# Patient Record
Sex: Male | Born: 1935
Health system: Southern US, Community
[De-identification: ages and names within clinical notes are randomized; demographics above are authoritative.]

## PROBLEM LIST (undated history)

## (undated) DIAGNOSIS — N184 Chronic kidney disease, stage 4 (severe): Secondary | ICD-10-CM

## (undated) DIAGNOSIS — H409 Unspecified glaucoma: Secondary | ICD-10-CM

## (undated) DIAGNOSIS — I10 Essential (primary) hypertension: Secondary | ICD-10-CM

## (undated) DIAGNOSIS — N189 Chronic kidney disease, unspecified: Secondary | ICD-10-CM

## (undated) DIAGNOSIS — H35039 Hypertensive retinopathy, unspecified eye: Secondary | ICD-10-CM

## (undated) DIAGNOSIS — Z862 Personal history of diseases of the blood and blood-forming organs and certain disorders involving the immune mechanism: Secondary | ICD-10-CM

## (undated) HISTORY — DX: Chronic kidney disease, stage 4 (severe): N18.4

## (undated) HISTORY — DX: Hypertensive retinopathy, unspecified eye: H35.039

## (undated) HISTORY — DX: Chronic kidney disease, unspecified: N18.9

## (undated) HISTORY — DX: Personal history of diseases of the blood and blood-forming organs and certain disorders involving the immune mechanism: Z86.2

## (undated) HISTORY — PX: CATARACT EXTRACTION: SUR2

## (undated) HISTORY — DX: Unspecified glaucoma: H40.9

## (undated) HISTORY — PX: EYE SURGERY: SHX253

## (undated) HISTORY — PX: OTHER SURGICAL HISTORY: SHX169

---

## 1999-04-23 ENCOUNTER — Ambulatory Visit (HOSPITAL_COMMUNITY): Admission: RE | Admit: 1999-04-23 | Discharge: 1999-04-23 | Payer: Self-pay | Admitting: *Deleted

## 2002-03-30 ENCOUNTER — Ambulatory Visit (HOSPITAL_COMMUNITY): Admission: RE | Admit: 2002-03-30 | Discharge: 2002-03-30 | Payer: Self-pay | Admitting: Internal Medicine

## 2006-06-08 ENCOUNTER — Encounter: Admission: RE | Admit: 2006-06-08 | Discharge: 2006-06-08 | Payer: Self-pay | Admitting: Orthopedic Surgery

## 2006-07-27 ENCOUNTER — Inpatient Hospital Stay (HOSPITAL_COMMUNITY): Admission: RE | Admit: 2006-07-27 | Discharge: 2006-07-31 | Payer: Self-pay | Admitting: Orthopedic Surgery

## 2007-10-18 ENCOUNTER — Encounter: Admission: RE | Admit: 2007-10-18 | Discharge: 2007-10-18 | Payer: Self-pay | Admitting: Internal Medicine

## 2008-01-18 ENCOUNTER — Encounter: Admission: RE | Admit: 2008-01-18 | Discharge: 2008-01-18 | Payer: Self-pay | Admitting: Internal Medicine

## 2008-07-19 ENCOUNTER — Encounter: Admission: RE | Admit: 2008-07-19 | Discharge: 2008-07-19 | Payer: Self-pay | Admitting: Internal Medicine

## 2008-08-20 ENCOUNTER — Encounter: Admission: RE | Admit: 2008-08-20 | Discharge: 2008-08-20 | Payer: Self-pay | Admitting: Internal Medicine

## 2010-06-28 ENCOUNTER — Encounter: Payer: Self-pay | Admitting: Orthopedic Surgery

## 2010-10-23 NOTE — Discharge Summary (Signed)
Danny Rhodes, Danny Rhodes               ACCOUNT NO.:  [id number]   MEDICAL RECORD NO.:  [id number]          PATIENT TYPE:  INP   LOCATION:  [id number]                         FACILITY:  [facility]   PHYSICIAN:  Pietro Cassis. Alvan Dame, M.D.  DATE OF BIRTH:  1927-05-01   DATE OF ADMISSION:  07/27/2006  DATE OF DISCHARGE:  07/31/2006                               DISCHARGE SUMMARY   ADMITTING DIAGNOSES:  1. Osteoarthritis.  2. Hypertension.  3. Hiatal hernia.  4. Petrusio acetabulum.  5. 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   DISCHARGE DIAGNOSES:  1. Osteoarthritis.  2. Hypertension.  3. Hiatal hernia.  4. Petrusio acetabulum.  5. 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   CONSULTS:  None.   PROCEDURE:  Revision of right total hip replacement utilizing  accommodation of auto and allograft products.  Surgeon was Paralee Cancel,  M.D.  Assistant Collene Mares, PA-C.   PREADMISSION LABORATORIES:  1. CBC showed his hemoglobin of 13.2, hematocrit 39.6.  Postoperative      day #1, respectively, hemoglobin 9.6, hematocrit 28.4.      Postoperative day #2 of 8.4 and 24.8.  On postoperative day #3 of      8.2 and 24.2.  2. Preadmission differential within normal limits.  3. Preadmission coagulation INR 1.1.  Postoperative day #1, INR of      1.3.  4. Preadmission chemistries - sodium 139, potassium 4.1, glucose 85.      Postoperative day #1 - sodium 139, potassium 4.8, glucose 121.      Postoperative day #2 - sodium 138, potassium 4.5, glucose 126.  5. Preadmission urinalysis negative.   ELECTROCARDIOGRAM:  Sinus bradycardia with first-degree AV block,  nonspecific T-wave abnormality, abnormal ECG.   CHEST X-RAY:  Left upper lobe opacities concerning for malignancy.  A CT  of the chest recommended.   POSTOPERATIVE RIGHT HIP:  Satisfactory position of the right total hip  prosthesis revision.   BRIEF HISTORY OF PRESENT ILLNESS:  This is a 77 year old  male with  multiple right hip surgeries with a history of acetabulum petrusio and  osteolysis.  He has been a patient of our practice and Dr. Gladstone Lighter for  years.  He was kindly referred to our office for evaluation.  He was  really seen after he fell, slipped off a curb and landed onto his right  hip.  Since that point in time, the pain has developed and gotten  persistently progressively worse.  He has developed acetabular petrusio.  As a result of this, he was cleared premedically by his primary care  physician for a revision right total hip replacement.   HOSPITAL COURSE:  The patient underwent revision of right total hip  replacement and tolerated the procedure well.  Pain was well-controlled  throughout his course of stay.  On postoperative day #1, he was doing  alright.  He had some pain in his great toe.  He had a history of gout  and was treated accordingly with some colchicine which relieved the  pain.  He was afebrile.  His INR was 1.3.  His hip was  dry.  He was  neuromuscularly and vascularly intact.  He was touchdown weightbearing  of the right lower extremity with the use of a rolling walker and DVT  prophylaxis and was begun with Coumadin.  PT evaluation was done which  recommended discharge to home with home health care physical therapy and  with family assistance, and [place] was selected.  On  postoperative day #2, he was doing well.  Dressing was changed.  The  wound was without active drainage.  He continued to be neuromuscularly  and vascularly intact.  He continued to be touchdown weightbearing of  his right lower extremity.  Occupational therapy recommended a bathtub,  a transfer bench, as well as a 3-in-1 commode and recommended home  health care physical therapy.  On postoperative day #3, he was doing  extremely well.  No acute events.  His hematocrit was 24.8 and stable.  His right hip was dry.  Neuromuscularly and vascularly intact and  continued be  touchdown weightbearing in the right lower extremity.  On  postoperative day #3, [facility] delivered the rolling walker,  transfer bench and 3-in-1 commode for home use.  On postoperative day  #4, the patient was doing well and was ready to be discharged home.  His  prescription was on the chart.  His hip was dry.  The dressing was  changed and showed no active drainage.  He was neuromuscular vascularly  intact.  Discharge physical therapy showed that by postoperative day #4,  he was able to use a rolling walker with minimal assistance, walking up  to 60 feet.  With occupational therapy was able to go from supine to  sitting with moderate assistance, sitting to standing with moderate  assistance and standing to sitting.   DISCHARGE DISPOSITION:  Discharged home with home health care physical  therapy, as well as occupational therapy.   DISCHARGE PHYSICAL THERAPY:  Goals of physical therapy will be to  minimize pain, maximize upper body strength, help in ambulation with  only touchdown weightbearing status, the use of a rolling walker, as  well as coordination with occupational therapy to help them with  activities of daily living.   DISCHARGE WOUND CARE:  Keep wound dry.  Change dressing daily.  If the  patient does want to shower, please cover with an impervious substance  utilizing tape.  Afterwards, redress with a dry dressing.   DISCHARGE FOLLOWUP:  Follow up with Dr. Alvan Dame, [phone], in 2 weeks for  wound care check.   DISCHARGE DIET:  Regular as tolerated by the patient.   DISCHARGE MEDICATIONS:  1. Diovan HCTZ 160/25 mg one p.o. q.a.m.  2. Potassium chloride 20 mEq 2 tabs p.o. b.i.d.  3. Caduet 10/20 one p.o. q.a.m.  4. Metoprolol 1-1/2 tablets 50 mg one p.o. b.i.d.  5. Vitamin C 500 mg one p.o. daily.  6. Coumadin, maintain INR between 2 and 3.  7. Propoxyphene N-100 one p.o. p.r.n. 4-6 p.r.n. pain. 8. Robaxin 500 mg one to two p.o. q.6-8h. p.r.n. muscle spasm  pain.  9. Colace 100 mg one p.o. b.i.d. p.r.n. constipation.   DISCHARGE SPECIAL INSTRUCTIONS:  If the patient develops any severe calf  pain or acute shortness of breath, please call emergency services  immediately.     ______________________________  Carlean Jews Collene Mares, [place]      Pietro Cassis. Alvan Dame, M.D.  Electronically Signed    BLM/MEDQ  D:  08/18/2006  T:  08/19/2006  Job:  [id number]

## 2010-10-23 NOTE — Op Note (Signed)
NAMEVICENT, Danny Rhodes               ACCOUNT NO.:  [id number]   MEDICAL RECORD NO.:  [id number]          PATIENT TYPE:  INP   LOCATION:  X007                         FACILITY:  [facility]   PHYSICIAN:  Danny Rhodes, M.D.  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   PROCEDURE:  Revision, right total hip replacement, utilizing a  combination of auto and allograft products.   COMPONENTS USED:  A Biomet revision acetabular cup, size 66, Regenerex  cup, two cancellous bone screws, a 32 with 10 degree high wall placed  superiorly with 10 degree high wall liner.  Then used an Osteonics 32 +  10 ball onto a dried trunnion of a stable femoral component.  I also  utilized 10 cc of cancellous VITOSS chips with 10 cc of the VITOSS putty  to pack off ischial defect as well as acetabular defect.   FINDINGS:  The patient was noted to have a severe acetabular defect but  was found to have an intact posterior column leading to an intact ileum  and an inferior column piece at the pubic rami but no evidence of any  anterior wall.  The ischium was severely effected with osteolysis, and  the entire medial wall was opened to the pelvis.   SURGEON:  Danny Rhodes, M.D.   ASSISTANT:  Danny Jews. Collene Mares, PA-C.   ANESTHESIA:  General.   BLOOD LOSS:  600 cc.  I utilized Cell Saver.  The patient received 200  cc of Cell Saver from the blood bank.   COMPLICATIONS:  None apparent.   INDICATIONS FOR PROCEDURE:  Mr. Danny Rhodes is a very pleasant 77 year old  gentleman who was referred kindly by Dr. Lindwood Rhodes for surgical  consideration of his right hip.  He had a history of a primary hip  replacement in the 1970s with subsequent revision and  periprosthetic  complications in the 0000000.  He had failure of his acetabulum with  superior migration and acetabular protrusio with persistent pain with  any common activity.   He presented for surgical consideration.  Following workup and ruling  out sources of infection as a source of failure, we discussed revision  surgery.  Risks and benefits were discussed, including infection,  dislocation, the need for further revision surgery, failure of this type  of procedure.  Consent obtained.   PROCEDURE IN DETAIL:  Patient was brought to the operating theater.  Once adequate anesthesia, then 1 gm of Ancef were administered, the  patient was positioned in the left lateral decubitus position with the  right side up.  The right hip, proximal thigh area, was prescrubbed with  Betadine, then prepped and draped in a sterile fashion from the knee to  the iliac crest.  Patient had multiple surgical incisions.  Two of these  incisions that were in relatively close proximity were excised,  debriding scar tissue.  The iliotibial band and gluteus fascia were  identified and found to be held intact with old Ethibond sutures.  These  were incised in line with the incision for a posterior approach.  At  this point, it was identified, a significant amount of scar tissue in  the posterior aspect of the hip.  Some time was spent at this point  exposing the hip joint, removing a bunch of the scar.  I did keep a  posterior leaflet of tissue available for primary closure as well as  protection against the sciatic nerve.   Following debridement, the hip joint was identified.  Joint fluid  appeared to be normal with no evidence of obvious purulence.  Following  exposure of the hip joint, the acetabular shell was noted to be rotated  significantly and obviously loose.  The hip was dislocated using a bone  hook.  The femoral stem was noted to be stable without evidence of  loosening.  I removed the femoral head  without complication.  This  allowed for further mobilization of the femur to further debride  anteriorly and allow for mobilization of the patient's hip.  Following  this and use of curettes, I was able to debride the rim of bone and soft  tissues around the cuff, then remove the cuff without complication.  At  this point, it exposed the very large medial wall defect.  Care was now  taken to debride the soft tissues from the remaining bone stock and  identify what was available.  Palpable examination indicated that the  pelvis was intact.  There was no evidence of any pelvic discontinuity;  however, there was very little bone left.  I did find that there was a  small portion of the anterior inferior acetabulum right near the pubic  bone intact.  The ischium was severely osteolytic and was debrided  extensively of osteolytic debris.  Posteriorly, there was a bit of his  posterior column intact up to the ileum anterior and proximally.  From  about one o'clock on anteriorly, there was no bone down to the pubis at  about 5 o'clock.   Following this debridement, I began evaluating through acetabular  placement.  Careful reaming was carried up sequentially to try to  prepare the bone as much as possible.  I did ream up to a 64 reamer,  getting some bony feedback but did not want to ream too much bone.  With  this, I chose to use a 66 cup.  This was opened.  A Biometer and  Regenerex 66 cup was then opened.   At this point, I did pack the ischium with approximately 10 cc of  cancellous chips as well as this putty bone graft on VITOSS.  I then  used the remaining portion of the putty, then the remaining chips to  create a mold for the anterior aspect of the ileum.  Once this was  placed, I impacted the 66 cup.  There was initial scratch fit present  but not super stable.  Based on the remaining bone stock, only two screws were possible to place, and this was done in an ileum.  These had  an  excellent purchase into the iliac bone.  With this and following  this, I checked the acetabular position.  The knee was in about 20-25  degrees of forward flexion and 40-45 degrees of abduction.  At this  point, evaluating the femoral stem, identifying the old  Morris taper  femoral stem.  Available in our hospital was a 32 head and this taper.  I then trialed with an available 32 liner for this system, which  included a 10 degree high wall build-up.  Based on the position of the  acetabulum, we chose using the high wall proximally but flattening in my  relative cup position to about 35-40 degrees.  The trial reduction was  first carried out with a +5 ball.  I then felt that the +10 ball was  more stable, eliminating shuck with the leg in extension.  With this,  the final 32 +10 Morris tapered head and Stryker was impacted onto a dry  trunnion after placement of the final 32 10 degree high wall liner.  The  hip was reduced, irrigated throughout the case and again at this point.  At this point, I reapproximated the posterior pseudocapsular tissue to  the superior capsular tissue.   I then sprayed the platelet poor material mixed with thrombin into the  wound.  The iliotibial band/gluteus fascia was then reapproximated using  the #1 Vicryl.  I then sprayed more of the platelet poor and thrombin  material.  The  remainder of the wound was closed in layers with 2-0 Vicryl and staples  on the skin.  The skin was cleaned, dried, and dressed sterilely with a  dressing sponge and the [place] dressing.  The patient was then  brought to the recovery room, extubated in stable condition.      Danny Cassis Alvan Rhodes, M.D.  Electronically Signed     MDO/MEDQ  D:  07/27/2006  T:  07/27/2006  Job:  [id number]

## 2010-10-23 NOTE — H&P (Signed)
Danny Rhodes, Danny Rhodes               ACCOUNT NO.:  [id number]   MEDICAL RECORD NO.:  [id number]          PATIENT TYPE:  INP   LOCATION:  NA                           FACILITY:  [facility]   PHYSICIAN:  Danny Rhodes, M.D.  DATE OF BIRTH:  January 21, 1927   DATE OF ADMISSION:  07/27/2006  DATE OF DISCHARGE:                              HISTORY & PHYSICAL   PROCEDURE:  Right total her revision right total hip arthroplasty.   CHIEF COMPLAINT:  Right hip pain.   HISTORY OF PRESENT ILLNESS:  This is a 77 year old male with multiple  right hip surgeries with a history of acetabular protrusio and  osteolysis. He has been a patient our practice for several years and was  referred to [place] by Dr. Gladstone Rhodes. Originally seen back in May, this problem  began after he fell and slipped off a curve and landed onto his right  hip.  Since that time get, it has gotten progressively worse to the  point to where he had developed this acetabular protrusio. He was  referred to Dr. Alvan Rhodes who saw the patient in the office and reviewed his  x-rays as well as a CT scan of his pelvis.  He has been scheduled for a  revision total hip replacement.   PAST MEDICAL HISTORY:  1. Hypertension.  2. Hiatal hernia.  3. Osteoarthritis.   PAST SURGICAL HISTORY:  1. Right total hip replacement in 1979.  2. Revision right total hip placement in 1991.   FAMILY MEDICAL HISTORY:  Heart disease.   SOCIAL HISTORY:  He is married to his wife Danny Rhodes and is retired and his  wife will be the primary caregiver after surgery.   DRUG ALLERGIES:  No known drug allergies.   MEDICATIONS:  Metoprolol, Diovan HCT, Caduet, Darvocet, Ecotrin, vitamin  C, potassium.   REVIEW OF SYSTEMS:  In the past 2 weeks, he has had no new signs or  symptoms of any cardiovascular, respiratory, gastrointestinal,  genitourinary, neurological or musculoskeletal system complaints.   PHYSICAL EXAM:  VITAL SIGNS:  Pulse 56, respirations 18, blood pressure  was  166/54.  GENERAL:  He is awake, alert and oriented, well-developed, well-  nourished and walks with a rolling walker, but in no acute distress.  NECK:  Supple.  No carotid bruits.  CHEST/LUNGS:  Clear to auscultation bilaterally.  BREASTS:  Deferred.  HEART:  Regular rate and rhythm without gallops, clicks, rubs or  murmurs.  ABDOMEN:  Soft, nontender, nondistended.  Bowel sounds present in all  four quadrants.  GENITOURINARY:  Deferred.  EXTREMITIES:  Walks with crutches, painful with range of motion.  SKIN:  Dorsalis pedis pulses positive.  No signs of cellulitis.  NEUROLOGIC:  Intact distal sensibilities.   Labs are pending his Danny Rhodes presurgical clearance.   X-rays are pending is chest x-ray.   Pelvis x-rays reviewed with the patient.   IMPRESSION:  Right-sided acetabular protrusio with osteolysis.   PLAN OF ACTION:  Revision right total hip arthroplasty on 07/27/2006 at  [facility] by Dr. Paralee Rhodes.  The risks and complications were  discussed with the  patient. Questions were encouraged, answered and  reviewed.     ______________________________  Danny Rhodes, [place]      Danny Rhodes, M.D.  Electronically Signed    BLM/MEDQ  D:  07/22/2006  T:  07/22/2006  Job:  [id number]   cc:   Danny Rhodes, M.D.  Fax: [phone]

## 2010-10-23 NOTE — Op Note (Signed)
   NAMEJOHNNYE, SENTZ                         ACCOUNT NO.:  [id number]   MEDICAL RECORD NO.:  [id number]                   PATIENT TYPE:  AMB   LOCATION:  ENDO                                 FACILITY:  [facility]   PHYSICIAN:  Waverly Ferrari, M.D.                 DATE OF BIRTH:  [birth year]   DATE OF PROCEDURE:  03/30/2002  DATE OF DISCHARGE:                                 OPERATIVE REPORT   PROCEDURE:  Colonoscopy.   INDICATIONS:  Colon polyps.   ANESTHESIA:  Demerol 50, Versed 5 mg.   DESCRIPTION OF PROCEDURE:  With patient mildly sedated in the left lateral  decubitus position, the Olympus videoscopic colonoscope was inserted in the  rectum after a normal rectal exam and passed under direct vision to the  cecum, identified by the ileocecal valve and appendiceal orifice, both of  which were photographed.  From this point, the colonoscope was slowly  withdrawn, taking circumferential views of the entire colonic mucosa,  stopping only in the rectum which appeared normal on direct and showed  hemorrhoids on retroflexed view.  The endoscope was straightened and  withdrawn.  The patient's vital signs and pulse oximeter remained stable.  The patient tolerated the procedure well without apparent complications.   FINDINGS:  Internal hemorrhoids, otherwise unremarkable examination.   PLAN:  Repeat exam in five years.                                               Waverly Ferrari, M.D.    GMO/MEDQ  D:  03/30/2002  T:  03/30/2002  Job:  [id number]

## 2013-05-08 ENCOUNTER — Other Ambulatory Visit: Payer: Self-pay | Admitting: Internal Medicine

## 2013-05-08 DIAGNOSIS — N281 Cyst of kidney, acquired: Secondary | ICD-10-CM

## 2013-05-09 ENCOUNTER — Ambulatory Visit
Admission: RE | Admit: 2013-05-09 | Discharge: 2013-05-09 | Disposition: A | Discharge status: Home / Self Care | Payer: Medicare Other | Source: Ambulatory Visit | Attending: Internal Medicine | Admitting: Internal Medicine

## 2013-05-09 DIAGNOSIS — N281 Cyst of kidney, acquired: Secondary | ICD-10-CM

## 2014-08-20 DIAGNOSIS — K0263 Dental caries on smooth surface penetrating into pulp: Secondary | ICD-10-CM | POA: Diagnosis not present

## 2015-04-17 DIAGNOSIS — Z23 Encounter for immunization: Secondary | ICD-10-CM | POA: Diagnosis not present

## 2015-05-09 DIAGNOSIS — I10 Essential (primary) hypertension: Secondary | ICD-10-CM | POA: Diagnosis not present

## 2015-05-09 DIAGNOSIS — E7800 Pure hypercholesterolemia, unspecified: Secondary | ICD-10-CM | POA: Diagnosis not present

## 2015-05-16 DIAGNOSIS — Z0000 Encounter for general adult medical examination without abnormal findings: Secondary | ICD-10-CM | POA: Diagnosis not present

## 2015-05-16 DIAGNOSIS — E782 Mixed hyperlipidemia: Secondary | ICD-10-CM | POA: Diagnosis not present

## 2015-05-16 DIAGNOSIS — Z125 Encounter for screening for malignant neoplasm of prostate: Secondary | ICD-10-CM | POA: Diagnosis not present

## 2015-05-16 DIAGNOSIS — I10 Essential (primary) hypertension: Secondary | ICD-10-CM | POA: Diagnosis not present

## 2015-11-11 DIAGNOSIS — Z125 Encounter for screening for malignant neoplasm of prostate: Secondary | ICD-10-CM | POA: Diagnosis not present

## 2015-11-11 DIAGNOSIS — I10 Essential (primary) hypertension: Secondary | ICD-10-CM | POA: Diagnosis not present

## 2015-11-11 DIAGNOSIS — Z0000 Encounter for general adult medical examination without abnormal findings: Secondary | ICD-10-CM | POA: Diagnosis not present

## 2015-11-20 DIAGNOSIS — I10 Essential (primary) hypertension: Secondary | ICD-10-CM | POA: Diagnosis not present

## 2015-11-20 DIAGNOSIS — Z0000 Encounter for general adult medical examination without abnormal findings: Secondary | ICD-10-CM | POA: Diagnosis not present

## 2015-11-20 DIAGNOSIS — E782 Mixed hyperlipidemia: Secondary | ICD-10-CM | POA: Diagnosis not present

## 2015-11-25 DIAGNOSIS — M25551 Pain in right hip: Secondary | ICD-10-CM | POA: Diagnosis not present

## 2015-11-25 DIAGNOSIS — Z471 Aftercare following joint replacement surgery: Secondary | ICD-10-CM | POA: Diagnosis not present

## 2015-11-25 DIAGNOSIS — Z96641 Presence of right artificial hip joint: Secondary | ICD-10-CM | POA: Diagnosis not present

## 2016-03-01 DIAGNOSIS — Z23 Encounter for immunization: Secondary | ICD-10-CM | POA: Diagnosis not present

## 2016-05-10 DIAGNOSIS — E782 Mixed hyperlipidemia: Secondary | ICD-10-CM | POA: Diagnosis not present

## 2016-05-10 DIAGNOSIS — I10 Essential (primary) hypertension: Secondary | ICD-10-CM | POA: Diagnosis not present

## 2016-05-17 DIAGNOSIS — E782 Mixed hyperlipidemia: Secondary | ICD-10-CM | POA: Diagnosis not present

## 2016-05-17 DIAGNOSIS — I10 Essential (primary) hypertension: Secondary | ICD-10-CM | POA: Diagnosis not present

## 2016-05-17 DIAGNOSIS — N189 Chronic kidney disease, unspecified: Secondary | ICD-10-CM | POA: Diagnosis not present

## 2016-06-01 DIAGNOSIS — R030 Elevated blood-pressure reading, without diagnosis of hypertension: Secondary | ICD-10-CM | POA: Diagnosis not present

## 2016-06-01 DIAGNOSIS — J018 Other acute sinusitis: Secondary | ICD-10-CM | POA: Diagnosis not present

## 2016-11-15 DIAGNOSIS — I10 Essential (primary) hypertension: Secondary | ICD-10-CM | POA: Diagnosis not present

## 2016-11-15 DIAGNOSIS — Z0000 Encounter for general adult medical examination without abnormal findings: Secondary | ICD-10-CM | POA: Diagnosis not present

## 2016-11-15 DIAGNOSIS — Z125 Encounter for screening for malignant neoplasm of prostate: Secondary | ICD-10-CM | POA: Diagnosis not present

## 2016-11-22 DIAGNOSIS — I10 Essential (primary) hypertension: Secondary | ICD-10-CM | POA: Diagnosis not present

## 2016-11-22 DIAGNOSIS — Z0000 Encounter for general adult medical examination without abnormal findings: Secondary | ICD-10-CM | POA: Diagnosis not present

## 2016-11-22 DIAGNOSIS — N189 Chronic kidney disease, unspecified: Secondary | ICD-10-CM | POA: Diagnosis not present

## 2016-11-22 DIAGNOSIS — E785 Hyperlipidemia, unspecified: Secondary | ICD-10-CM | POA: Diagnosis not present

## 2017-01-26 DIAGNOSIS — N183 Chronic kidney disease, stage 3 (moderate): Secondary | ICD-10-CM | POA: Diagnosis not present

## 2017-01-26 DIAGNOSIS — N189 Chronic kidney disease, unspecified: Secondary | ICD-10-CM | POA: Diagnosis not present

## 2017-01-26 DIAGNOSIS — N2581 Secondary hyperparathyroidism of renal origin: Secondary | ICD-10-CM | POA: Diagnosis not present

## 2017-01-26 DIAGNOSIS — R809 Proteinuria, unspecified: Secondary | ICD-10-CM | POA: Diagnosis not present

## 2017-01-26 DIAGNOSIS — I129 Hypertensive chronic kidney disease with stage 1 through stage 4 chronic kidney disease, or unspecified chronic kidney disease: Secondary | ICD-10-CM | POA: Diagnosis not present

## 2017-01-26 DIAGNOSIS — D631 Anemia in chronic kidney disease: Secondary | ICD-10-CM | POA: Diagnosis not present

## 2017-01-26 DIAGNOSIS — D509 Iron deficiency anemia, unspecified: Secondary | ICD-10-CM | POA: Diagnosis not present

## 2017-01-27 ENCOUNTER — Other Ambulatory Visit: Payer: Self-pay | Admitting: Nephrology

## 2017-01-27 DIAGNOSIS — N183 Chronic kidney disease, stage 3 unspecified: Secondary | ICD-10-CM

## 2017-02-02 ENCOUNTER — Ambulatory Visit
Admission: RE | Admit: 2017-02-02 | Discharge: 2017-02-02 | Disposition: A | Discharge status: Home / Self Care | Payer: Medicare Other | Source: Ambulatory Visit | Attending: Nephrology | Admitting: Nephrology

## 2017-02-02 DIAGNOSIS — N189 Chronic kidney disease, unspecified: Secondary | ICD-10-CM | POA: Diagnosis not present

## 2017-02-02 DIAGNOSIS — N183 Chronic kidney disease, stage 3 unspecified: Secondary | ICD-10-CM

## 2017-03-18 DIAGNOSIS — Z23 Encounter for immunization: Secondary | ICD-10-CM | POA: Diagnosis not present

## 2017-04-22 DIAGNOSIS — N183 Chronic kidney disease, stage 3 (moderate): Secondary | ICD-10-CM | POA: Diagnosis not present

## 2017-04-22 DIAGNOSIS — D631 Anemia in chronic kidney disease: Secondary | ICD-10-CM | POA: Diagnosis not present

## 2017-04-22 DIAGNOSIS — R809 Proteinuria, unspecified: Secondary | ICD-10-CM | POA: Diagnosis not present

## 2017-04-22 DIAGNOSIS — D509 Iron deficiency anemia, unspecified: Secondary | ICD-10-CM | POA: Diagnosis not present

## 2017-04-22 DIAGNOSIS — N189 Chronic kidney disease, unspecified: Secondary | ICD-10-CM | POA: Diagnosis not present

## 2017-04-22 DIAGNOSIS — I129 Hypertensive chronic kidney disease with stage 1 through stage 4 chronic kidney disease, or unspecified chronic kidney disease: Secondary | ICD-10-CM | POA: Diagnosis not present

## 2017-05-06 DIAGNOSIS — I129 Hypertensive chronic kidney disease with stage 1 through stage 4 chronic kidney disease, or unspecified chronic kidney disease: Secondary | ICD-10-CM | POA: Diagnosis not present

## 2017-05-06 DIAGNOSIS — N183 Chronic kidney disease, stage 3 (moderate): Secondary | ICD-10-CM | POA: Diagnosis not present

## 2017-05-20 DIAGNOSIS — N189 Chronic kidney disease, unspecified: Secondary | ICD-10-CM | POA: Diagnosis not present

## 2017-05-20 DIAGNOSIS — E785 Hyperlipidemia, unspecified: Secondary | ICD-10-CM | POA: Diagnosis not present

## 2017-05-20 DIAGNOSIS — I10 Essential (primary) hypertension: Secondary | ICD-10-CM | POA: Diagnosis not present

## 2017-05-20 DIAGNOSIS — D638 Anemia in other chronic diseases classified elsewhere: Secondary | ICD-10-CM | POA: Diagnosis not present

## 2017-05-23 DIAGNOSIS — E782 Mixed hyperlipidemia: Secondary | ICD-10-CM | POA: Diagnosis not present

## 2017-05-23 DIAGNOSIS — I10 Essential (primary) hypertension: Secondary | ICD-10-CM | POA: Diagnosis not present

## 2017-05-23 DIAGNOSIS — N189 Chronic kidney disease, unspecified: Secondary | ICD-10-CM | POA: Diagnosis not present

## 2017-08-15 DIAGNOSIS — N189 Chronic kidney disease, unspecified: Secondary | ICD-10-CM | POA: Diagnosis not present

## 2017-08-15 DIAGNOSIS — I129 Hypertensive chronic kidney disease with stage 1 through stage 4 chronic kidney disease, or unspecified chronic kidney disease: Secondary | ICD-10-CM | POA: Diagnosis not present

## 2017-08-15 DIAGNOSIS — D631 Anemia in chronic kidney disease: Secondary | ICD-10-CM | POA: Diagnosis not present

## 2017-08-15 DIAGNOSIS — N2581 Secondary hyperparathyroidism of renal origin: Secondary | ICD-10-CM | POA: Diagnosis not present

## 2017-08-15 DIAGNOSIS — D509 Iron deficiency anemia, unspecified: Secondary | ICD-10-CM | POA: Diagnosis not present

## 2017-08-15 DIAGNOSIS — N183 Chronic kidney disease, stage 3 (moderate): Secondary | ICD-10-CM | POA: Diagnosis not present

## 2017-08-15 DIAGNOSIS — R809 Proteinuria, unspecified: Secondary | ICD-10-CM | POA: Diagnosis not present

## 2017-11-10 DIAGNOSIS — D509 Iron deficiency anemia, unspecified: Secondary | ICD-10-CM | POA: Diagnosis not present

## 2017-11-10 DIAGNOSIS — N189 Chronic kidney disease, unspecified: Secondary | ICD-10-CM | POA: Diagnosis not present

## 2017-11-10 DIAGNOSIS — I129 Hypertensive chronic kidney disease with stage 1 through stage 4 chronic kidney disease, or unspecified chronic kidney disease: Secondary | ICD-10-CM | POA: Diagnosis not present

## 2017-11-10 DIAGNOSIS — R809 Proteinuria, unspecified: Secondary | ICD-10-CM | POA: Diagnosis not present

## 2017-11-10 DIAGNOSIS — N2581 Secondary hyperparathyroidism of renal origin: Secondary | ICD-10-CM | POA: Diagnosis not present

## 2017-11-10 DIAGNOSIS — D631 Anemia in chronic kidney disease: Secondary | ICD-10-CM | POA: Diagnosis not present

## 2017-11-10 DIAGNOSIS — N183 Chronic kidney disease, stage 3 (moderate): Secondary | ICD-10-CM | POA: Diagnosis not present

## 2017-11-18 DIAGNOSIS — I10 Essential (primary) hypertension: Secondary | ICD-10-CM | POA: Diagnosis not present

## 2017-11-18 DIAGNOSIS — E782 Mixed hyperlipidemia: Secondary | ICD-10-CM | POA: Diagnosis not present

## 2017-11-18 DIAGNOSIS — N189 Chronic kidney disease, unspecified: Secondary | ICD-10-CM | POA: Diagnosis not present

## 2017-11-24 DIAGNOSIS — N183 Chronic kidney disease, stage 3 (moderate): Secondary | ICD-10-CM | POA: Diagnosis not present

## 2017-11-25 ENCOUNTER — Ambulatory Visit (HOSPITAL_COMMUNITY)
Admission: RE | Admit: 2017-11-25 | Discharge: 2017-11-25 | Disposition: A | Discharge status: Home / Self Care | Payer: Medicare Other | Source: Ambulatory Visit | Attending: Nephrology | Admitting: Nephrology

## 2017-11-25 DIAGNOSIS — E785 Hyperlipidemia, unspecified: Secondary | ICD-10-CM | POA: Diagnosis not present

## 2017-11-25 DIAGNOSIS — I10 Essential (primary) hypertension: Secondary | ICD-10-CM | POA: Diagnosis not present

## 2017-11-25 DIAGNOSIS — N189 Chronic kidney disease, unspecified: Secondary | ICD-10-CM | POA: Insufficient documentation

## 2017-11-25 DIAGNOSIS — D631 Anemia in chronic kidney disease: Secondary | ICD-10-CM | POA: Diagnosis not present

## 2017-11-25 DIAGNOSIS — Z0000 Encounter for general adult medical examination without abnormal findings: Secondary | ICD-10-CM | POA: Diagnosis not present

## 2017-11-25 DIAGNOSIS — D638 Anemia in other chronic diseases classified elsewhere: Secondary | ICD-10-CM | POA: Diagnosis not present

## 2017-11-25 MED ORDER — SODIUM CHLORIDE 0.9 % IV SOLN
510.0000 mg | INTRAVENOUS | Status: DC
  Administered 2017-11-25: 510 mg via INTRAVENOUS
  Filled 2017-11-25: qty 17

## 2017-11-25 NOTE — Discharge Instructions (Signed)

## 2017-11-28 DIAGNOSIS — M25551 Pain in right hip: Secondary | ICD-10-CM | POA: Diagnosis not present

## 2017-12-01 DIAGNOSIS — I129 Hypertensive chronic kidney disease with stage 1 through stage 4 chronic kidney disease, or unspecified chronic kidney disease: Secondary | ICD-10-CM | POA: Diagnosis not present

## 2017-12-02 ENCOUNTER — Ambulatory Visit (HOSPITAL_COMMUNITY)
Admission: RE | Admit: 2017-12-02 | Discharge: 2017-12-02 | Disposition: A | Discharge status: Home / Self Care | Payer: Medicare Other | Source: Ambulatory Visit | Attending: Nephrology | Admitting: Nephrology

## 2017-12-02 DIAGNOSIS — D631 Anemia in chronic kidney disease: Secondary | ICD-10-CM | POA: Insufficient documentation

## 2017-12-02 DIAGNOSIS — N189 Chronic kidney disease, unspecified: Secondary | ICD-10-CM | POA: Diagnosis not present

## 2017-12-02 MED ORDER — SODIUM CHLORIDE 0.9 % IV SOLN
510.0000 mg | INTRAVENOUS | Status: DC
  Administered 2017-12-02: 510 mg via INTRAVENOUS
  Filled 2017-12-02: qty 17

## 2017-12-05 ENCOUNTER — Other Ambulatory Visit (HOSPITAL_COMMUNITY): Payer: Self-pay | Admitting: Physician Assistant

## 2017-12-05 DIAGNOSIS — M25551 Pain in right hip: Secondary | ICD-10-CM

## 2017-12-16 ENCOUNTER — Encounter (HOSPITAL_COMMUNITY)
Admission: RE | Admit: 2017-12-16 | Discharge: 2017-12-16 | Disposition: A | Discharge status: Home / Self Care | Payer: Medicare Other | Source: Ambulatory Visit | Attending: Physician Assistant | Admitting: Physician Assistant

## 2017-12-16 ENCOUNTER — Encounter (HOSPITAL_COMMUNITY): Admission: RE | Admit: 2017-12-16 | Payer: Medicare Other | Source: Ambulatory Visit

## 2017-12-16 DIAGNOSIS — M25551 Pain in right hip: Secondary | ICD-10-CM | POA: Diagnosis not present

## 2017-12-16 DIAGNOSIS — Z471 Aftercare following joint replacement surgery: Secondary | ICD-10-CM | POA: Diagnosis not present

## 2017-12-16 DIAGNOSIS — Z96641 Presence of right artificial hip joint: Secondary | ICD-10-CM | POA: Diagnosis not present

## 2017-12-16 MED ORDER — TECHNETIUM TC 99M MEDRONATE IV KIT
20.5000 | PACK | Freq: Once | INTRAVENOUS | Status: AC | PRN
  Administered 2017-12-16: 20.5 via INTRAVENOUS

## 2017-12-19 DIAGNOSIS — M25551 Pain in right hip: Secondary | ICD-10-CM | POA: Diagnosis not present

## 2017-12-22 DIAGNOSIS — M25551 Pain in right hip: Secondary | ICD-10-CM | POA: Diagnosis not present

## 2018-02-01 DIAGNOSIS — H35033 Hypertensive retinopathy, bilateral: Secondary | ICD-10-CM | POA: Diagnosis not present

## 2018-02-01 DIAGNOSIS — H25013 Cortical age-related cataract, bilateral: Secondary | ICD-10-CM | POA: Diagnosis not present

## 2018-02-01 DIAGNOSIS — H40023 Open angle with borderline findings, high risk, bilateral: Secondary | ICD-10-CM | POA: Diagnosis not present

## 2018-02-01 DIAGNOSIS — H2513 Age-related nuclear cataract, bilateral: Secondary | ICD-10-CM | POA: Diagnosis not present

## 2018-02-08 DIAGNOSIS — H18423 Band keratopathy, bilateral: Secondary | ICD-10-CM | POA: Diagnosis not present

## 2018-02-08 DIAGNOSIS — H401234 Low-tension glaucoma, bilateral, indeterminate stage: Secondary | ICD-10-CM | POA: Diagnosis not present

## 2018-02-08 DIAGNOSIS — H18893 Other specified disorders of cornea, bilateral: Secondary | ICD-10-CM | POA: Diagnosis not present

## 2018-02-17 DIAGNOSIS — Z23 Encounter for immunization: Secondary | ICD-10-CM | POA: Diagnosis not present

## 2018-03-01 DIAGNOSIS — N2581 Secondary hyperparathyroidism of renal origin: Secondary | ICD-10-CM | POA: Diagnosis not present

## 2018-03-01 DIAGNOSIS — N189 Chronic kidney disease, unspecified: Secondary | ICD-10-CM | POA: Diagnosis not present

## 2018-03-01 DIAGNOSIS — N183 Chronic kidney disease, stage 3 (moderate): Secondary | ICD-10-CM | POA: Diagnosis not present

## 2018-03-01 DIAGNOSIS — D509 Iron deficiency anemia, unspecified: Secondary | ICD-10-CM | POA: Diagnosis not present

## 2018-03-01 DIAGNOSIS — R809 Proteinuria, unspecified: Secondary | ICD-10-CM | POA: Diagnosis not present

## 2018-03-01 DIAGNOSIS — D631 Anemia in chronic kidney disease: Secondary | ICD-10-CM | POA: Diagnosis not present

## 2018-03-01 DIAGNOSIS — I129 Hypertensive chronic kidney disease with stage 1 through stage 4 chronic kidney disease, or unspecified chronic kidney disease: Secondary | ICD-10-CM | POA: Diagnosis not present

## 2018-03-09 DIAGNOSIS — H401234 Low-tension glaucoma, bilateral, indeterminate stage: Secondary | ICD-10-CM | POA: Diagnosis not present

## 2018-03-16 DIAGNOSIS — I129 Hypertensive chronic kidney disease with stage 1 through stage 4 chronic kidney disease, or unspecified chronic kidney disease: Secondary | ICD-10-CM | POA: Diagnosis not present

## 2018-04-11 DIAGNOSIS — H18413 Arcus senilis, bilateral: Secondary | ICD-10-CM | POA: Diagnosis not present

## 2018-04-11 DIAGNOSIS — H18423 Band keratopathy, bilateral: Secondary | ICD-10-CM | POA: Diagnosis not present

## 2018-04-11 DIAGNOSIS — H18893 Other specified disorders of cornea, bilateral: Secondary | ICD-10-CM | POA: Diagnosis not present

## 2018-04-11 DIAGNOSIS — H401234 Low-tension glaucoma, bilateral, indeterminate stage: Secondary | ICD-10-CM | POA: Diagnosis not present

## 2018-05-18 DIAGNOSIS — E782 Mixed hyperlipidemia: Secondary | ICD-10-CM | POA: Diagnosis not present

## 2018-05-18 DIAGNOSIS — I10 Essential (primary) hypertension: Secondary | ICD-10-CM | POA: Diagnosis not present

## 2018-05-25 DIAGNOSIS — E782 Mixed hyperlipidemia: Secondary | ICD-10-CM | POA: Diagnosis not present

## 2018-05-25 DIAGNOSIS — N189 Chronic kidney disease, unspecified: Secondary | ICD-10-CM | POA: Diagnosis not present

## 2018-05-25 DIAGNOSIS — I10 Essential (primary) hypertension: Secondary | ICD-10-CM | POA: Diagnosis not present

## 2018-05-25 DIAGNOSIS — D638 Anemia in other chronic diseases classified elsewhere: Secondary | ICD-10-CM | POA: Diagnosis not present

## 2018-05-26 DIAGNOSIS — I129 Hypertensive chronic kidney disease with stage 1 through stage 4 chronic kidney disease, or unspecified chronic kidney disease: Secondary | ICD-10-CM | POA: Diagnosis not present

## 2018-05-26 DIAGNOSIS — D509 Iron deficiency anemia, unspecified: Secondary | ICD-10-CM | POA: Diagnosis not present

## 2018-05-26 DIAGNOSIS — R809 Proteinuria, unspecified: Secondary | ICD-10-CM | POA: Diagnosis not present

## 2018-05-26 DIAGNOSIS — N2581 Secondary hyperparathyroidism of renal origin: Secondary | ICD-10-CM | POA: Diagnosis not present

## 2018-05-26 DIAGNOSIS — N184 Chronic kidney disease, stage 4 (severe): Secondary | ICD-10-CM | POA: Diagnosis not present

## 2018-05-26 DIAGNOSIS — N189 Chronic kidney disease, unspecified: Secondary | ICD-10-CM | POA: Diagnosis not present

## 2018-05-26 DIAGNOSIS — D631 Anemia in chronic kidney disease: Secondary | ICD-10-CM | POA: Diagnosis not present

## 2018-06-16 DIAGNOSIS — N184 Chronic kidney disease, stage 4 (severe): Secondary | ICD-10-CM | POA: Diagnosis not present

## 2018-06-16 DIAGNOSIS — I129 Hypertensive chronic kidney disease with stage 1 through stage 4 chronic kidney disease, or unspecified chronic kidney disease: Secondary | ICD-10-CM | POA: Diagnosis not present

## 2018-07-04 DIAGNOSIS — H18423 Band keratopathy, bilateral: Secondary | ICD-10-CM | POA: Diagnosis not present

## 2018-07-04 DIAGNOSIS — H40053 Ocular hypertension, bilateral: Secondary | ICD-10-CM | POA: Diagnosis not present

## 2018-07-04 DIAGNOSIS — H18893 Other specified disorders of cornea, bilateral: Secondary | ICD-10-CM | POA: Diagnosis not present

## 2018-07-04 DIAGNOSIS — H401234 Low-tension glaucoma, bilateral, indeterminate stage: Secondary | ICD-10-CM | POA: Diagnosis not present

## 2018-08-24 DIAGNOSIS — N2581 Secondary hyperparathyroidism of renal origin: Secondary | ICD-10-CM | POA: Diagnosis not present

## 2018-08-24 DIAGNOSIS — N184 Chronic kidney disease, stage 4 (severe): Secondary | ICD-10-CM | POA: Diagnosis not present

## 2018-08-24 DIAGNOSIS — N189 Chronic kidney disease, unspecified: Secondary | ICD-10-CM | POA: Diagnosis not present

## 2018-08-24 DIAGNOSIS — R809 Proteinuria, unspecified: Secondary | ICD-10-CM | POA: Diagnosis not present

## 2018-08-24 DIAGNOSIS — D631 Anemia in chronic kidney disease: Secondary | ICD-10-CM | POA: Diagnosis not present

## 2018-08-24 DIAGNOSIS — I129 Hypertensive chronic kidney disease with stage 1 through stage 4 chronic kidney disease, or unspecified chronic kidney disease: Secondary | ICD-10-CM | POA: Diagnosis not present

## 2018-08-24 DIAGNOSIS — D509 Iron deficiency anemia, unspecified: Secondary | ICD-10-CM | POA: Diagnosis not present

## 2018-09-01 ENCOUNTER — Other Ambulatory Visit (HOSPITAL_COMMUNITY): Payer: Self-pay | Admitting: *Deleted

## 2018-09-04 ENCOUNTER — Ambulatory Visit (HOSPITAL_COMMUNITY)
Admission: RE | Admit: 2018-09-04 | Discharge: 2018-09-04 | Disposition: A | Discharge status: Home / Self Care | Payer: Medicare Other | Source: Ambulatory Visit | Attending: Nephrology | Admitting: Nephrology

## 2018-09-04 ENCOUNTER — Other Ambulatory Visit: Payer: Self-pay

## 2018-09-04 DIAGNOSIS — D631 Anemia in chronic kidney disease: Secondary | ICD-10-CM | POA: Diagnosis not present

## 2018-09-04 DIAGNOSIS — N189 Chronic kidney disease, unspecified: Secondary | ICD-10-CM | POA: Diagnosis not present

## 2018-09-04 MED ORDER — SODIUM CHLORIDE 0.9 % IV SOLN
510.0000 mg | INTRAVENOUS | Status: DC
  Administered 2018-09-04: 510 mg via INTRAVENOUS
  Filled 2018-09-04: qty 510

## 2018-09-11 ENCOUNTER — Other Ambulatory Visit: Payer: Self-pay

## 2018-09-11 ENCOUNTER — Encounter (HOSPITAL_COMMUNITY)
Admission: RE | Admit: 2018-09-11 | Discharge: 2018-09-11 | Disposition: A | Discharge status: Home / Self Care | Payer: Medicare Other | Source: Ambulatory Visit | Attending: Nephrology | Admitting: Nephrology

## 2018-09-11 DIAGNOSIS — D631 Anemia in chronic kidney disease: Secondary | ICD-10-CM | POA: Insufficient documentation

## 2018-09-11 DIAGNOSIS — N189 Chronic kidney disease, unspecified: Secondary | ICD-10-CM | POA: Insufficient documentation

## 2018-09-11 MED ORDER — SODIUM CHLORIDE 0.9 % IV SOLN
510.0000 mg | INTRAVENOUS | Status: DC
  Administered 2018-09-11: 510 mg via INTRAVENOUS
  Filled 2018-09-11: qty 510

## 2018-10-03 DIAGNOSIS — H2513 Age-related nuclear cataract, bilateral: Secondary | ICD-10-CM | POA: Diagnosis not present

## 2018-10-03 DIAGNOSIS — H18423 Band keratopathy, bilateral: Secondary | ICD-10-CM | POA: Diagnosis not present

## 2018-10-03 DIAGNOSIS — H40053 Ocular hypertension, bilateral: Secondary | ICD-10-CM | POA: Diagnosis not present

## 2018-10-03 DIAGNOSIS — H401234 Low-tension glaucoma, bilateral, indeterminate stage: Secondary | ICD-10-CM | POA: Diagnosis not present

## 2018-11-22 DIAGNOSIS — Z0000 Encounter for general adult medical examination without abnormal findings: Secondary | ICD-10-CM | POA: Diagnosis not present

## 2018-11-22 DIAGNOSIS — R809 Proteinuria, unspecified: Secondary | ICD-10-CM | POA: Diagnosis not present

## 2018-11-22 DIAGNOSIS — E7800 Pure hypercholesterolemia, unspecified: Secondary | ICD-10-CM | POA: Diagnosis not present

## 2018-11-23 DIAGNOSIS — D509 Iron deficiency anemia, unspecified: Secondary | ICD-10-CM | POA: Diagnosis not present

## 2018-11-23 DIAGNOSIS — N189 Chronic kidney disease, unspecified: Secondary | ICD-10-CM | POA: Diagnosis not present

## 2018-11-23 DIAGNOSIS — I129 Hypertensive chronic kidney disease with stage 1 through stage 4 chronic kidney disease, or unspecified chronic kidney disease: Secondary | ICD-10-CM | POA: Diagnosis not present

## 2018-11-23 DIAGNOSIS — R809 Proteinuria, unspecified: Secondary | ICD-10-CM | POA: Diagnosis not present

## 2018-11-23 DIAGNOSIS — D631 Anemia in chronic kidney disease: Secondary | ICD-10-CM | POA: Diagnosis not present

## 2018-11-23 DIAGNOSIS — N2581 Secondary hyperparathyroidism of renal origin: Secondary | ICD-10-CM | POA: Diagnosis not present

## 2018-11-23 DIAGNOSIS — N184 Chronic kidney disease, stage 4 (severe): Secondary | ICD-10-CM | POA: Diagnosis not present

## 2018-11-29 DIAGNOSIS — N184 Chronic kidney disease, stage 4 (severe): Secondary | ICD-10-CM | POA: Diagnosis not present

## 2018-11-30 DIAGNOSIS — K432 Incisional hernia without obstruction or gangrene: Secondary | ICD-10-CM | POA: Diagnosis not present

## 2018-11-30 DIAGNOSIS — Z0000 Encounter for general adult medical examination without abnormal findings: Secondary | ICD-10-CM | POA: Diagnosis not present

## 2018-11-30 DIAGNOSIS — N189 Chronic kidney disease, unspecified: Secondary | ICD-10-CM | POA: Diagnosis not present

## 2018-11-30 DIAGNOSIS — I10 Essential (primary) hypertension: Secondary | ICD-10-CM | POA: Diagnosis not present

## 2018-12-15 DIAGNOSIS — N184 Chronic kidney disease, stage 4 (severe): Secondary | ICD-10-CM | POA: Diagnosis not present

## 2018-12-25 ENCOUNTER — Other Ambulatory Visit: Payer: Self-pay | Admitting: Nephrology

## 2018-12-25 ENCOUNTER — Ambulatory Visit
Admission: RE | Admit: 2018-12-25 | Discharge: 2018-12-25 | Disposition: A | Discharge status: Home / Self Care | Payer: Medicare Other | Source: Ambulatory Visit | Attending: Nephrology | Admitting: Nephrology

## 2018-12-29 DIAGNOSIS — N184 Chronic kidney disease, stage 4 (severe): Secondary | ICD-10-CM | POA: Diagnosis not present

## 2019-01-03 DIAGNOSIS — H18423 Band keratopathy, bilateral: Secondary | ICD-10-CM | POA: Diagnosis not present

## 2019-01-03 DIAGNOSIS — H401234 Low-tension glaucoma, bilateral, indeterminate stage: Secondary | ICD-10-CM | POA: Diagnosis not present

## 2019-01-03 DIAGNOSIS — H02831 Dermatochalasis of right upper eyelid: Secondary | ICD-10-CM | POA: Diagnosis not present

## 2019-01-03 DIAGNOSIS — H40053 Ocular hypertension, bilateral: Secondary | ICD-10-CM | POA: Diagnosis not present

## 2019-01-18 DIAGNOSIS — H401234 Low-tension glaucoma, bilateral, indeterminate stage: Secondary | ICD-10-CM | POA: Diagnosis not present

## 2019-01-18 DIAGNOSIS — H35033 Hypertensive retinopathy, bilateral: Secondary | ICD-10-CM | POA: Diagnosis not present

## 2019-01-18 DIAGNOSIS — H2513 Age-related nuclear cataract, bilateral: Secondary | ICD-10-CM | POA: Diagnosis not present

## 2019-01-18 DIAGNOSIS — H40053 Ocular hypertension, bilateral: Secondary | ICD-10-CM | POA: Diagnosis not present

## 2019-01-24 DIAGNOSIS — I129 Hypertensive chronic kidney disease with stage 1 through stage 4 chronic kidney disease, or unspecified chronic kidney disease: Secondary | ICD-10-CM | POA: Diagnosis not present

## 2019-01-24 DIAGNOSIS — N2581 Secondary hyperparathyroidism of renal origin: Secondary | ICD-10-CM | POA: Diagnosis not present

## 2019-01-24 DIAGNOSIS — D631 Anemia in chronic kidney disease: Secondary | ICD-10-CM | POA: Diagnosis not present

## 2019-01-24 DIAGNOSIS — N184 Chronic kidney disease, stage 4 (severe): Secondary | ICD-10-CM | POA: Diagnosis not present

## 2019-01-30 IMAGING — NM NM BONE 3 PHASE
2 series · 12 of 12 positions shown · non-contrast
Comparison: None

Correlation: CT pelvis 06/08/2006; no recent radiographic
correlation

CLINICAL DATA: RIGHT hip pain for 2 weeks, multiple prior RIGHT hip
replacement surgeries most recently 07/27/2006, denies trauma hips

EXAM:
NUCLEAR MEDICINE 3-PHASE BONE SCAN
TECHNIQUE: Radionuclide angiographic images, immediate static blood pool
images, and 3-hour delayed static images were obtained of the hips
after intravenous injection of radiopharmaceutical.
RADIOPHARMACEUTICALS:  20.5 mCi 1c-WWm MDP IV

[Series 1: flow · 4.14mm/px · 6 of 40 frames shown (1 of 2)]
[frame 4/40  full-range]
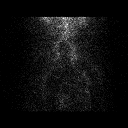
[frame 10/40  full-range]
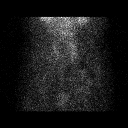
[frame 17/40  full-range]
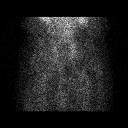
[frame 24/40  full-range]
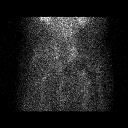
[frame 30/40  full-range]
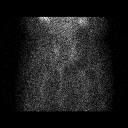
[frame 37/40  full-range]
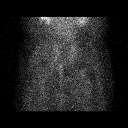

[Series 1: flow · 4.14mm/px · 6 of 40 frames shown (2 of 2)]
[frame 4/40  full-range]
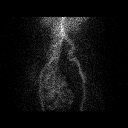
[frame 10/40  full-range]
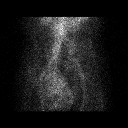
[frame 17/40  full-range]
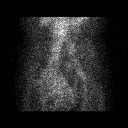
[frame 24/40  full-range]
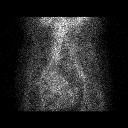
[frame 30/40  full-range]
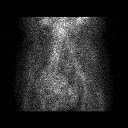
[frame 37/40  full-range]
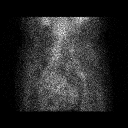

[12 of 12 positions shown; findings below may reference images not displayed]

FINDINGS: Vascular phase: Increased blood flow to the region of the RIGHT hip,
RIGHT inguinal region, and RIGHT perineum/scrotum. The increased
flow to the RIGHT perineal and scrotal regions extending from the
RIGHT inguinal region corresponds to a large RIGHT inguinal hernia
on the prior pelvic CT. No abnormal blood flow to the LEFT hip.

Blood pool phase: Increased blood pool at RIGHT hip region.
Additionally, increased blood pool at the RIGHT inguinal region,
RIGHT perineum and RIGHT scrotum corresponding to the large RIGHT
inguinal hernia.

Delayed phase: Photopenic defect at RIGHT hip consistent with hip
prosthesis. Increased tracer localization is identified adjacent to
the proximal and distal portions of the femoral component of the
prosthesis consistent with loosening or infection. Abnormal tracer
localization is identified surrounding a large photopenic region
extending from the RIGHT inguinal region into the RIGHT perineum and
scrotum, corresponding to the periphery of the previously identified
large RIGHT inguinal hernia. No abnormal tracer localization within
the pelvis or at the LEFT hip joint.
IMPRESSION: RIGHT hip prosthesis with abnormal tracer localization adjacent to
the proximal and distal aspects of the femoral component of the
prosthesis either representing prosthetic loosening or infection.

Increased blood flow, blood pool and peripheral delayed tracer
localization at the previously identified large RIGHT inguinal
hernia which extends into the scrotum.

## 2019-02-19 DIAGNOSIS — Z23 Encounter for immunization: Secondary | ICD-10-CM | POA: Diagnosis not present

## 2019-02-28 DIAGNOSIS — D631 Anemia in chronic kidney disease: Secondary | ICD-10-CM | POA: Diagnosis not present

## 2019-02-28 DIAGNOSIS — I129 Hypertensive chronic kidney disease with stage 1 through stage 4 chronic kidney disease, or unspecified chronic kidney disease: Secondary | ICD-10-CM | POA: Diagnosis not present

## 2019-02-28 DIAGNOSIS — E871 Hypo-osmolality and hyponatremia: Secondary | ICD-10-CM | POA: Diagnosis not present

## 2019-02-28 DIAGNOSIS — N2581 Secondary hyperparathyroidism of renal origin: Secondary | ICD-10-CM | POA: Diagnosis not present

## 2019-02-28 DIAGNOSIS — N189 Chronic kidney disease, unspecified: Secondary | ICD-10-CM | POA: Diagnosis not present

## 2019-02-28 DIAGNOSIS — N184 Chronic kidney disease, stage 4 (severe): Secondary | ICD-10-CM | POA: Diagnosis not present

## 2019-03-28 DIAGNOSIS — I129 Hypertensive chronic kidney disease with stage 1 through stage 4 chronic kidney disease, or unspecified chronic kidney disease: Secondary | ICD-10-CM | POA: Diagnosis not present

## 2019-03-28 DIAGNOSIS — N184 Chronic kidney disease, stage 4 (severe): Secondary | ICD-10-CM | POA: Diagnosis not present

## 2019-04-26 DIAGNOSIS — N184 Chronic kidney disease, stage 4 (severe): Secondary | ICD-10-CM | POA: Diagnosis not present

## 2019-04-26 DIAGNOSIS — D631 Anemia in chronic kidney disease: Secondary | ICD-10-CM | POA: Diagnosis not present

## 2019-04-26 DIAGNOSIS — N189 Chronic kidney disease, unspecified: Secondary | ICD-10-CM | POA: Diagnosis not present

## 2019-04-26 DIAGNOSIS — E871 Hypo-osmolality and hyponatremia: Secondary | ICD-10-CM | POA: Diagnosis not present

## 2019-04-26 DIAGNOSIS — I129 Hypertensive chronic kidney disease with stage 1 through stage 4 chronic kidney disease, or unspecified chronic kidney disease: Secondary | ICD-10-CM | POA: Diagnosis not present

## 2019-04-26 DIAGNOSIS — N2581 Secondary hyperparathyroidism of renal origin: Secondary | ICD-10-CM | POA: Diagnosis not present

## 2019-05-07 DIAGNOSIS — H2513 Age-related nuclear cataract, bilateral: Secondary | ICD-10-CM | POA: Diagnosis not present

## 2019-05-07 DIAGNOSIS — H401234 Low-tension glaucoma, bilateral, indeterminate stage: Secondary | ICD-10-CM | POA: Diagnosis not present

## 2019-05-07 DIAGNOSIS — H25013 Cortical age-related cataract, bilateral: Secondary | ICD-10-CM | POA: Diagnosis not present

## 2019-05-30 DIAGNOSIS — I10 Essential (primary) hypertension: Secondary | ICD-10-CM | POA: Diagnosis not present

## 2019-05-30 DIAGNOSIS — E782 Mixed hyperlipidemia: Secondary | ICD-10-CM | POA: Diagnosis not present

## 2019-06-06 DIAGNOSIS — I10 Essential (primary) hypertension: Secondary | ICD-10-CM | POA: Diagnosis not present

## 2019-06-06 DIAGNOSIS — Z0000 Encounter for general adult medical examination without abnormal findings: Secondary | ICD-10-CM | POA: Diagnosis not present

## 2019-06-06 DIAGNOSIS — D649 Anemia, unspecified: Secondary | ICD-10-CM | POA: Diagnosis not present

## 2019-06-06 DIAGNOSIS — N183 Chronic kidney disease, stage 3 unspecified: Secondary | ICD-10-CM | POA: Diagnosis not present

## 2019-06-06 DIAGNOSIS — E7800 Pure hypercholesterolemia, unspecified: Secondary | ICD-10-CM | POA: Diagnosis not present

## 2019-06-30 ENCOUNTER — Ambulatory Visit: Payer: Medicare (Managed Care) | Attending: Internal Medicine

## 2019-06-30 DIAGNOSIS — Z23 Encounter for immunization: Secondary | ICD-10-CM | POA: Insufficient documentation

## 2019-07-28 ENCOUNTER — Ambulatory Visit: Payer: Medicare (Managed Care)

## 2019-08-04 ENCOUNTER — Ambulatory Visit: Payer: Medicare (Managed Care) | Attending: Internal Medicine

## 2019-08-04 DIAGNOSIS — Z23 Encounter for immunization: Secondary | ICD-10-CM

## 2019-08-04 NOTE — Progress Notes (Signed)
   Covid-19 Vaccination Clinic  Name:  HUSNAIN ZAHNOW    MRN: [id number] DOB: [birth year]  08/04/2019  Mr. Flynn was observed post Covid-19 immunization for 15 minutes without incidence. He was provided with Vaccine Information Sheet and instruction to access the V-Safe system.   Mr. Braff was instructed to call 911 with any severe reactions post vaccine: Marland Kitchen Difficulty breathing  . Swelling of your face and throat  . A fast heartbeat  . A bad rash all over your body  . Dizziness and weakness    Immunizations Administered    Name Date Dose VIS Date Route   Moderna COVID-19 Vaccine 08/04/2019  8:53 AM 0.5 mL 05/08/2019 Intramuscular   Manufacturer: Moderna   Lot: [id number]   [id number]

## 2019-08-17 DIAGNOSIS — E871 Hypo-osmolality and hyponatremia: Secondary | ICD-10-CM | POA: Diagnosis not present

## 2019-08-17 DIAGNOSIS — D631 Anemia in chronic kidney disease: Secondary | ICD-10-CM | POA: Diagnosis not present

## 2019-08-17 DIAGNOSIS — N2581 Secondary hyperparathyroidism of renal origin: Secondary | ICD-10-CM | POA: Diagnosis not present

## 2019-08-17 DIAGNOSIS — I129 Hypertensive chronic kidney disease with stage 1 through stage 4 chronic kidney disease, or unspecified chronic kidney disease: Secondary | ICD-10-CM | POA: Diagnosis not present

## 2019-08-17 DIAGNOSIS — N189 Chronic kidney disease, unspecified: Secondary | ICD-10-CM | POA: Diagnosis not present

## 2019-08-17 DIAGNOSIS — N184 Chronic kidney disease, stage 4 (severe): Secondary | ICD-10-CM | POA: Diagnosis not present

## 2019-08-24 DIAGNOSIS — H401234 Low-tension glaucoma, bilateral, indeterminate stage: Secondary | ICD-10-CM | POA: Diagnosis not present

## 2019-08-24 DIAGNOSIS — H2513 Age-related nuclear cataract, bilateral: Secondary | ICD-10-CM | POA: Diagnosis not present

## 2019-11-16 ENCOUNTER — Other Ambulatory Visit: Payer: Self-pay

## 2019-11-16 ENCOUNTER — Emergency Department (HOSPITAL_COMMUNITY)
Admission: EM | Admit: 2019-11-16 | Discharge: 2019-11-16 | Disposition: A | Discharge status: Home / Self Care | Payer: Medicare Other | Attending: Emergency Medicine | Admitting: Emergency Medicine

## 2019-11-16 ENCOUNTER — Encounter (HOSPITAL_COMMUNITY): Payer: Self-pay

## 2019-11-16 DIAGNOSIS — Z7982 Long term (current) use of aspirin: Secondary | ICD-10-CM | POA: Insufficient documentation

## 2019-11-16 DIAGNOSIS — M545 Low back pain, unspecified: Secondary | ICD-10-CM

## 2019-11-16 MED ORDER — IBUPROFEN 400 MG PO TABS: 400.0000 mg | ORAL_TABLET | Freq: Four times a day (QID) | ORAL | 0 refills | Status: DC | PRN

## 2019-11-16 MED ORDER — IBUPROFEN 200 MG PO TABS
400.0000 mg | ORAL_TABLET | Freq: Once | ORAL | Status: AC
  Administered 2019-11-16: 400 mg via ORAL
  Filled 2019-11-16: qty 2

## 2019-11-16 MED ORDER — ACETAMINOPHEN 325 MG PO TABS: 650.0000 mg | ORAL_TABLET | Freq: Four times a day (QID) | ORAL | 0 refills | Status: DC | PRN

## 2019-11-16 MED ORDER — TRAMADOL HCL 50 MG PO TABS: 50.0000 mg | ORAL_TABLET | Freq: Every evening | ORAL | 0 refills | Status: DC | PRN

## 2019-11-16 NOTE — ED Provider Notes (Signed)
[facility] DEPT Provider Note   CSN: [id number] Arrival date & time: 11/16/19  1906     History Chief Complaint  Patient presents with  . Flank Pain    Danny Rhodes is a 86 y.o. male presented to emergency part with acute pain in his lower back.  The patient reports that he was bending over to pull stopper out of the bathtub this evening around 6 PM.  He says that when he tried to straighten up he felt a sudden pain in his left lower back.  It was worse when he tried to straighten up completely.  He said it is also worse with movement of his left leg.  He has never had this kind of pain before.  He reports he had back issues many years ago but denies any spinal surgery history.  He denies any radiation of his symptoms down his legs.  He denies any bowel or bladder incontinence.  He denies any history of kidney stones.  He took 2 Tylenol just prior to his arrival.  He is not on any other chronic pain medicine.  There were no falls or trauma today.  HPI     History reviewed. No pertinent past medical history.  There are no problems to display for this patient.   History reviewed. No pertinent family history.  Social History   Tobacco Use  . Smoking status: Not on file  Substance Use Topics  . Alcohol use: Not on file  . Drug use: Not on file    Home Medications Prior to Admission medications   Medication Sig Start Date End Date Taking? Authorizing Provider  acetaminophen (TYLENOL) 325 MG tablet Take 2 tablets (650 mg total) by mouth every 6 (six) hours as needed for up to 30 doses. 11/16/19   Wyvonnia Dusky, MD  acetaminophen (TYLENOL) 500 MG tablet Take 1,000 mg by mouth 2 (two) times daily.    [provider]  amLODipine (NORVASC) 10 MG tablet Take 10 mg by mouth daily.    [provider]  amLODipine (NORVASC) 5 MG tablet Take 5 mg by mouth daily. 11/18/17   [provider]  aspirin EC 81 MG tablet Take 81 mg  by mouth daily.    [provider]  atorvastatin (LIPITOR) 20 MG tablet Take 20 mg by mouth daily at 6 PM. 09/26/17   [provider]  furosemide (LASIX) 20 MG tablet Take 20 mg by mouth daily. 09/09/17   [provider]  ibuprofen (ADVIL) 400 MG tablet Take 1 tablet (400 mg total) by mouth every 6 (six) hours as needed for up to 10 doses. 11/16/19   Wyvonnia Dusky, MD  metoprolol tartrate (LOPRESSOR) 50 MG tablet Take 50 mg by mouth 2 (two) times daily. 11/16/17   [provider]  olmesartan-hydrochlorothiazide (BENICAR HCT) 40-25 MG tablet Take 1 tablet by mouth daily.    [provider]  traMADol (ULTRAM) 50 MG tablet Take 1 tablet (50 mg total) by mouth at bedtime as needed for up to 6 doses for severe pain. 11/16/19   Wyvonnia Dusky, MD  valsartan-hydrochlorothiazide (DIOVAN-HCT) 320-25 MG tablet Take 1 tablet by mouth daily. 10/12/17   [provider]  vitamin C (ASCORBIC ACID) 500 MG tablet Take 500 mg by mouth daily.    [provider]    Allergies    Patient has no known allergies.  Review of Systems   Review of Systems  Constitutional: Negative for  chills and fever.  Eyes: Negative for pain and visual disturbance.  Respiratory: Negative for cough and shortness of breath.   Cardiovascular: Negative for chest pain and palpitations.  Gastrointestinal: Negative for abdominal pain and vomiting.  Genitourinary: Negative for dysuria and hematuria.  Musculoskeletal: Positive for arthralgias and back pain. Negative for joint swelling.  Skin: Negative for pallor and rash.  Neurological: Negative for weakness, numbness and headaches.  Psychiatric/Behavioral: Negative for agitation and confusion.  All other systems reviewed and are negative.   Physical Exam Updated Vital Signs BP (!) 182/59 (BP Location: Left Arm)   Pulse 81   Temp 98.1 F (36.7 C) (Oral)   Resp 18   Ht 5\' 6"  (1.676 m)   Wt 78 kg   SpO2 100%   BMI 27.76  kg/m   Physical Exam Vitals and nursing note reviewed.  Constitutional:      Appearance: He is well-developed.  HENT:     Head: Normocephalic and atraumatic.  Eyes:     Conjunctiva/sclera: Conjunctivae normal.  Cardiovascular:     Rate and Rhythm: Normal rate and regular rhythm.     Pulses: Normal pulses.  Pulmonary:     Effort: Pulmonary effort is normal. No respiratory distress.  Abdominal:     Palpations: Abdomen is soft.     Tenderness: There is no abdominal tenderness.  Musculoskeletal:     Cervical back: Neck supple.     Comments: Diffuse paraspinal muscle tenderness   Skin:    General: Skin is warm and dry.  Neurological:     General: No focal deficit present.     Mental Status: He is alert and oriented to person, place, and time.     Sensory: No sensory deficit.     Motor: No weakness.     Comments: Positive straight leg test on left      ED Results / Procedures / Treatments   Labs (all labs ordered are listed, but only abnormal results are displayed) Labs Reviewed  URINALYSIS, ROUTINE W REFLEX MICROSCOPIC    EKG None  Radiology No results found.  Procedures Procedures (including critical care time)  Medications Ordered in ED Medications  ibuprofen (ADVIL) tablet 400 mg (400 mg Oral Given 11/16/19 2022)    ED Course  I have reviewed the triage vital signs and the nursing notes.  Pertinent labs & imaging results that were available during my care of the patient were reviewed by me and considered in my medical decision making (see chart for details).  86 yo male presenting with acute lower back pain after bending over today and trying to straighten up.  History and physical exam highly consistent with disc herniation or lumbago.  Doubtful of spinal fracture with no falls or traumatic mechanism.  No neurological deficits on exam.  No red flags for cord compression or cauda equina.  He can ambulate but with pain.  We discussed pain mgmt options -  tylenol as first line, heating packs, an OCCASIONAL motrin for breakthrough pain (but not every day, as he has kidney disease), and I will offer a few tramadol, which he will only take with family supervision.  His wife and daughter at present in the ED for this discussion.  They live with him, and agree with the plan.  Anticipate 4-6 weeks of symptoms prior to resolution.  PCP may want to consider MRI.  F/u with PCP     Final Clinical Impression(s) / ED Diagnoses Final diagnoses:  Acute left-sided low back  pain without sciatica    Rx / DC Orders ED Discharge Orders         Ordered    acetaminophen (TYLENOL) 325 MG tablet  Every 6 hours PRN     Discontinue  Reprint     11/16/19 2010    ibuprofen (ADVIL) 400 MG tablet  Every 6 hours PRN     Discontinue  Reprint     11/16/19 2010    traMADol (ULTRAM) 50 MG tablet  At bedtime PRN     Discontinue  Reprint     11/16/19 2010           Wyvonnia Dusky, MD 11/17/19 0021

## 2019-11-16 NOTE — Discharge Instructions (Signed)
I think you may have herniated or slipped disc in your lower back while you are bending over in the bathtub today.  This is very painful condition, and can generally cause pain for about 4 to 6 weeks.  Most people do recover from this.  Please follow-up with your doctor in 1 week.  If you begin having numbness in your legs, difficulty controlling your bladder (peeing on yourself), or have severe worsening pain, you should return to the emergency department.  While you are at home, you can take Tylenol 650 mg every 6 hours for pain.  For the NEXT TWO DAYS ONLY, you can also take motrin 400 mg every 6 hours for pain.  Do not take Motrin longer than this.  Motrin can cause problems with your kidney and your stomach.  You should always take this with some food.  Finally, I prescribed a few tablets of tramadol.  This is a very strong pain medicine.  This is only for severe pain, especially at night when you are trying to sleep.  Please be aware that this medicine can make you dizzy.  It can increase your risk of falls.  It can also make you nauseous or confused.  If this happens, you should not take tramadol anymore.  Please take it only with family around to watch you.  You can apply heating packs to your back.  Try to not stay in bed all day, and move about the house.  Be careful with bending over - this will cause your back to hurt and even lock up!

## 2019-11-16 NOTE — ED Triage Notes (Signed)
Pt reports L flank pain. He states that he first felt the pain when he leaned down to put the stopper in the bath tub around 6p. States that it is worse when he stands up or moves. A&Ox4. Denies dysuria.

## 2019-11-28 DIAGNOSIS — E7800 Pure hypercholesterolemia, unspecified: Secondary | ICD-10-CM | POA: Diagnosis not present

## 2019-11-28 DIAGNOSIS — Z0000 Encounter for general adult medical examination without abnormal findings: Secondary | ICD-10-CM | POA: Diagnosis not present

## 2019-11-28 DIAGNOSIS — N183 Chronic kidney disease, stage 3 unspecified: Secondary | ICD-10-CM | POA: Diagnosis not present

## 2019-11-28 DIAGNOSIS — I10 Essential (primary) hypertension: Secondary | ICD-10-CM | POA: Diagnosis not present

## 2019-11-28 DIAGNOSIS — D649 Anemia, unspecified: Secondary | ICD-10-CM | POA: Diagnosis not present

## 2019-12-07 DIAGNOSIS — D649 Anemia, unspecified: Secondary | ICD-10-CM | POA: Diagnosis not present

## 2019-12-07 DIAGNOSIS — Z0000 Encounter for general adult medical examination without abnormal findings: Secondary | ICD-10-CM | POA: Diagnosis not present

## 2019-12-28 DIAGNOSIS — H25013 Cortical age-related cataract, bilateral: Secondary | ICD-10-CM | POA: Diagnosis not present

## 2019-12-28 DIAGNOSIS — H401234 Low-tension glaucoma, bilateral, indeterminate stage: Secondary | ICD-10-CM | POA: Diagnosis not present

## 2019-12-28 DIAGNOSIS — H2589 Other age-related cataract: Secondary | ICD-10-CM | POA: Diagnosis not present

## 2019-12-28 DIAGNOSIS — H2513 Age-related nuclear cataract, bilateral: Secondary | ICD-10-CM | POA: Diagnosis not present

## 2020-01-04 DIAGNOSIS — H2511 Age-related nuclear cataract, right eye: Secondary | ICD-10-CM | POA: Diagnosis not present

## 2020-01-04 DIAGNOSIS — H2513 Age-related nuclear cataract, bilateral: Secondary | ICD-10-CM | POA: Diagnosis not present

## 2020-01-04 DIAGNOSIS — H25013 Cortical age-related cataract, bilateral: Secondary | ICD-10-CM | POA: Diagnosis not present

## 2020-01-04 DIAGNOSIS — H401133 Primary open-angle glaucoma, bilateral, severe stage: Secondary | ICD-10-CM | POA: Diagnosis not present

## 2020-01-04 DIAGNOSIS — H353221 Exudative age-related macular degeneration, left eye, with active choroidal neovascularization: Secondary | ICD-10-CM | POA: Diagnosis not present

## 2020-01-07 NOTE — Progress Notes (Signed)
[facility]  01/09/2020     CHIEF COMPLAINT Patient presents for Retina Evaluation   HISTORY OF PRESENT ILLNESS: Danny Rhodes is a 86 y.o. male who presents to the clinic today for:   HPI    Retina Evaluation    In both eyes.  This started 1 year ago.  Duration of 1 year.  Context:  distance vision and near vision.  I, the attending physician,  performed the HPI with the patient and updated documentation appropriately.          Comments    Pt using: Dorzolamide BID OU Latanoprost QHS OU  Pt states his vision has been poor in both eyes for over a year and states left eye is worse than right.  Patient denies eye pain or discomfort and denies any new or worsening floaters or fol OU.  Pt denies any Hx of ocular surgeries       Last edited by Bernarda Caffey, MD on 01/09/2020 11:46 AM. (History)    pt is here on the referral of Dr. Kathlen Mody for cataract clearance, pt has been seeing Dr. Kathlen Mody for about a year, pt states his vision has been getting blurry, he feels like his left eye is worse than the right, he is scheduled for right eye sx on August 11, left eye is scheduled for September 15  Referring physician: Marshall Cork, MD [place] [id number]  HISTORICAL INFORMATION:   Selected notes from the MEDICAL RECORD NUMBER Referred by Dr. Kathlen Mody LEE: 07.30.21 BCVA OD 20/40, OS 20/50 Ocular Hx- POAG OU severe, Cataracts OU, HTN Retinopathy OU, NVAMD OS, Band Keratopathy OU    CURRENT MEDICATIONS: No current outpatient medications on file. (Ophthalmic Drugs)   No current facility-administered medications for this visit. (Ophthalmic Drugs)   Current Outpatient Medications (Other)  Medication Sig  . acetaminophen (TYLENOL) 325 MG tablet Take 2 tablets (650 mg total) by mouth every 6 (six) hours as needed for up to 30 doses.  Marland Kitchen acetaminophen (TYLENOL) 500 MG tablet Take 1,000 mg by mouth 2 (two) times daily.  Marland Kitchen  amLODipine (NORVASC) 10 MG tablet Take 10 mg by mouth daily.  Marland Kitchen amLODipine (NORVASC) 5 MG tablet Take 5 mg by mouth daily.  Marland Kitchen aspirin EC 81 MG tablet Take 81 mg by mouth daily.  Marland Kitchen atorvastatin (LIPITOR) 20 MG tablet Take 20 mg by mouth daily at 6 PM.  . furosemide (LASIX) 20 MG tablet Take 20 mg by mouth daily.  Marland Kitchen ibuprofen (ADVIL) 400 MG tablet Take 1 tablet (400 mg total) by mouth every 6 (six) hours as needed for up to 10 doses.  . metoprolol tartrate (LOPRESSOR) 50 MG tablet Take 50 mg by mouth 2 (two) times daily.  Marland Kitchen olmesartan-hydrochlorothiazide (BENICAR HCT) 40-25 MG tablet Take 1 tablet by mouth daily.  . traMADol (ULTRAM) 50 MG tablet Take 1 tablet (50 mg total) by mouth at bedtime as needed for up to 6 doses for severe pain.  . valsartan-hydrochlorothiazide (DIOVAN-HCT) 320-25 MG tablet Take 1 tablet by mouth daily.  . vitamin C (ASCORBIC ACID) 500 MG tablet Take 500 mg by mouth daily.   No current facility-administered medications for this visit. (Other)      REVIEW OF SYSTEMS: ROS    Positive for: Eyes   Negative for: Constitutional, Gastrointestinal, Neurological, Skin, Genitourinary, Musculoskeletal, HENT, Endocrine, Cardiovascular, Respiratory, Psychiatric, Allergic/Imm, Heme/Lymph   Last edited by Doneen Poisson on 01/09/2020  8:19 AM. (History)       ALLERGIES No Known Allergies  PAST MEDICAL HISTORY History reviewed. No pertinent past medical history. History reviewed. No pertinent surgical history.  FAMILY HISTORY History reviewed. No pertinent family history.  SOCIAL HISTORY Social History   Tobacco Use  . Smoking status: Not on file  Substance Use Topics  . Alcohol use: Not on file  . Drug use: Not on file         OPHTHALMIC EXAM:  Base Eye Exam    Visual Acuity (Snellen - Linear)      Right Left   Dist [place] 20/40 -2 20/70 +1   Dist ph [place] NI 20/60 -2       Tonometry (Tonopen, 8:32 AM)      Right Left   Pressure 14 12       Pupils       Dark Light Shape React APD   Right 4 3 Round Brisk 0   Left 4 3 Round Brisk 0       Visual Fields      Left Right    Full    Restrictions  Partial outer inferior temporal, inferior nasal deficiencies       Extraocular Movement      Right Left    Full Full       Neuro/Psych    Oriented x3: Yes   Mood/Affect: Normal       Dilation    Both eyes: 1.0% Mydriacyl, 2.5% Phenylephrine @ 8:32 AM        Slit Lamp and Fundus Exam    Slit Lamp Exam      Right Left   Lids/Lashes Dermatochalasis - upper lid Dermatochalasis - upper lid   Conjunctiva/Sclera mild Melanosis nasal and temporal pinguecula   Cornea arcus, 1+ Punctate epithelial erosions arcus, 1+ Punctate epithelial erosions, early band K   Anterior Chamber deep and clear, narrow temporal angle deep and clear, narrow temporal angle   Iris Round and moderately dilated  Round and moderately dilated    Lens 3-4+ Nuclear sclerosis with brunescence, 3+ Cortical cataract 3-4+ Nuclear sclerosis with brunescence, 3+ Cortical cataract   Vitreous Vitreous syneresis Vitreous syneresis       Fundus Exam      Right Left   Disc Mild Pallor, +cupping, scattered PPP, thin inferior rim Mild Pallor, +cupping, scattered PPP, thin inferior rim   C/D Ratio 0.75 0.8   Macula Flat, Blunted foveal reflex, RPE mottling, clumping and atrophy Blunted foveal reflex, focal pigment ring/CNV with surrounding shallow SRF IT to fovea, RPE mottling, clumping and atrophy   Vessels Vascular attenuation, mild tortuousity Vascular attenuation, mild tortuousity, focal perivascular pigment clumping temporally   Periphery Attached, No heme  Attached, focal perivascular pigment clumping temporally, No heme         Refraction    Wearing Rx      Sphere   Right None   Left None       Manifest Refraction      Sphere Cylinder Axis Dist VA   Right +0.00 +1.25 070 20/40-1   Left -1.00 +1.25 070 20/60-2          IMAGING AND PROCEDURES  Imaging and  Procedures for 01/09/2020  OCT, Retina - OU - Both Eyes       Right Eye Quality was good. Central Foveal Thickness: 218. Progression has no prior data. Findings include normal foveal contour, no IRF, no SRF (Trace ERM; partial PVD).  Left Eye Quality was good. Central Foveal Thickness: 228. Progression has no prior data. Findings include abnormal foveal contour, subretinal hyper-reflective material, subretinal fluid.   Notes *Images captured and stored on drive  Diagnosis / Impression:  OD: NFP, no IRF/SRF OS: focal SRHM with surrounding SRF -- +CNV  Clinical management:  See below  Abbreviations: NFP - Normal foveal profile. CME - cystoid macular edema. PED - pigment epithelial detachment. IRF - intraretinal fluid. SRF - subretinal fluid. EZ - ellipsoid zone. ERM - epiretinal membrane. ORA - outer retinal atrophy. ORT - outer retinal tubulation. SRHM - subretinal hyper-reflective material. IRHM - intraretinal hyper-reflective material       Intravitreal Injection, Pharmacologic Agent - OS - Left Eye       Time Out 01/09/2020. 8:49 AM. Confirmed correct patient, procedure, site, and patient consented.   Anesthesia Topical anesthesia was used. Anesthetic medications included Lidocaine 2%, Proparacaine 0.5%.   Procedure Preparation included 5% betadine to ocular surface, eyelid speculum. A supplied needle was used.   Injection:  1.25 mg Bevacizumab (AVASTIN) SOLN   NDC: 18841-660-63, Lot: [id number] , Expiration date: 01/30/2020   Route: Intravitreal, Site: Left Eye, Waste: 0 mL  Post-op Post injection exam found visual acuity of at least counting fingers. The patient tolerated the procedure well. There were no complications. The patient received written and verbal post procedure care education. Post injection medications were not given.                 ASSESSMENT/PLAN:    ICD-10-CM   1. Choroidal neovascular membrane of left eye  H35.052 Intravitreal Injection,  Pharmacologic Agent - OS - Left Eye    Bevacizumab (AVASTIN) SOLN 1.25 mg  2. Retinal edema  H35.81 OCT, Retina - OU - Both Eyes  3. Exudative age-related macular degeneration of left eye with active choroidal neovascularization (HCC)  H35.3221 Intravitreal Injection, Pharmacologic Agent - OS - Left Eye    Bevacizumab (AVASTIN) SOLN 1.25 mg  4. Essential hypertension  I10   5. Hypertensive retinopathy of both eyes  H35.033   6. Combined forms of age-related cataract of both eyes  H25.813   7. Primary open angle glaucoma (POAG) of both eyes, severe stage  H40.1133     1-3. Choroidal neovascular membrane w/ +SRF  - idiopathic CNV vs Exudative age related macular degeneration, OS  - The incidence pathology and anatomy of wet AMD discussed   - discussed treatment options including observation vs intravitreal anti-VEGF agents such as Avastin, Lucentis, Eylea.    - Risks of endophthalmitis and vascular occlusive events and atrophic changes discussed with patient  - OCT shows focal CNV  - interestingly, OD without drusen and OS with minimal drusen, which we would see in typical ARMD  - recommend IVA OS #1 today, 08.04.21 for active CNV - pt wishes to be treated with IVA OS - RBA of procedure discussed, questions answered - informed consent obtained, signed and scanned, 08.04.21 - see procedure note  - f/u in 4 wks -- DFE/OCT, possible injection  - clear from a retina standpoint to proceed with cataract surgery when pt and surgeon are ready  4,5. Hypertensive retinopathy OU - discussed importance of tight BP control - monitor  6. Mixed Cataract OU - The symptoms of cataract, surgical options, and treatments and risks were discussed with patient. - discussed diagnosis and progression - under the expert managaement of Dr. 21.04.21 - OD scheduled for surgery on January 16, 2020 - clear from a retina standpoint  to proceed with cataract surgery when pt and surgeon are ready  7. POAG OU  - under  the expert management of Dr. Kathlen Mody  - IOP: 14,12 today  - on dorzolamide BID and latanoprost QHS per Dr. Kathlen Mody  Ophthalmic Meds Ordered this visit:  Meds ordered this encounter  Medications  . Bevacizumab (AVASTIN) SOLN 1.25 mg       Return in about 4 weeks (around 02/06/2020) for f/u exu ARMD OS, DFE, OCT.  There are no Patient Instructions on file for this visit.   Explained the diagnoses, plan, and follow up with the patient and they expressed understanding.  Patient expressed understanding of the importance of proper follow up care.   This document serves as a record of services personally performed by Gardiner Sleeper, MD, PhD. It was created on their behalf by Leonie Douglas, an ophthalmic technician. The creation of this record is the provider's dictation and/or activities during the visit.    Electronically signed by: Leonie Douglas COA, 01/09/20  12:06 PM   This document serves as a record of services personally performed by Gardiner Sleeper, MD, PhD. It was created on their behalf by San Jetty. Owens Shark, OA an ophthalmic technician. The creation of this record is the provider's dictation and/or activities during the visit.    Electronically signed by: San Jetty. Owens Shark, [place] 08.04.2021 12:06 PM  Gardiner Sleeper, M.D., Ph.D. Diseases & Surgery of the Retina and Vitreous [facility]  I have reviewed the above documentation for accuracy and completeness, and I agree with the above. Gardiner Sleeper, M.D., Ph.D. 01/09/20 12:06 PM   Abbreviations: M myopia (nearsighted); A astigmatism; H hyperopia (farsighted); P presbyopia; Mrx spectacle prescription;  CTL contact lenses; OD right eye; OS left eye; OU both eyes  XT exotropia; ET esotropia; PEK punctate epithelial keratitis; PEE punctate epithelial erosions; DES dry eye syndrome; MGD meibomian gland dysfunction; ATs artificial tears; PFAT's preservative free artificial tears; [place] nuclear sclerotic cataract; PSC  posterior subcapsular cataract; ERM epi-retinal membrane; PVD posterior vitreous detachment; RD retinal detachment; DM diabetes mellitus; DR diabetic retinopathy; NPDR non-proliferative diabetic retinopathy; PDR proliferative diabetic retinopathy; CSME clinically significant macular edema; DME diabetic macular edema; dbh dot blot hemorrhages; CWS cotton wool spot; POAG primary open angle glaucoma; C/D cup-to-disc ratio; HVF humphrey visual field; GVF goldmann visual field; OCT optical coherence tomography; IOP intraocular pressure; BRVO Branch retinal vein occlusion; CRVO central retinal vein occlusion; CRAO central retinal artery occlusion; BRAO branch retinal artery occlusion; RT retinal tear; SB scleral buckle; PPV pars plana vitrectomy; VH Vitreous hemorrhage; PRP panretinal laser photocoagulation; IVK intravitreal kenalog; VMT vitreomacular traction; MH Macular hole;  NVD neovascularization of the disc; NVE neovascularization elsewhere; AREDS age related eye disease study; ARMD age related macular degeneration; POAG primary open angle glaucoma; EBMD epithelial/anterior basement membrane dystrophy; ACIOL anterior chamber intraocular lens; IOL intraocular lens; PCIOL posterior chamber intraocular lens; Phaco/IOL phacoemulsification with intraocular lens placement; [place] photorefractive keratectomy; LASIK laser assisted in situ keratomileusis; HTN hypertension; DM diabetes mellitus; COPD chronic obstructive pulmonary disease

## 2020-01-09 ENCOUNTER — Encounter (INDEPENDENT_AMBULATORY_CARE_PROVIDER_SITE_OTHER): Payer: Self-pay | Admitting: Ophthalmology

## 2020-01-09 ENCOUNTER — Other Ambulatory Visit: Payer: Self-pay

## 2020-01-09 ENCOUNTER — Ambulatory Visit (INDEPENDENT_AMBULATORY_CARE_PROVIDER_SITE_OTHER): Payer: Medicare Other | Admitting: Ophthalmology

## 2020-01-09 DIAGNOSIS — I10 Essential (primary) hypertension: Secondary | ICD-10-CM | POA: Diagnosis not present

## 2020-01-09 DIAGNOSIS — H353221 Exudative age-related macular degeneration, left eye, with active choroidal neovascularization: Secondary | ICD-10-CM | POA: Diagnosis not present

## 2020-01-09 DIAGNOSIS — H35033 Hypertensive retinopathy, bilateral: Secondary | ICD-10-CM

## 2020-01-09 DIAGNOSIS — H3581 Retinal edema: Secondary | ICD-10-CM

## 2020-01-09 DIAGNOSIS — H25813 Combined forms of age-related cataract, bilateral: Secondary | ICD-10-CM

## 2020-01-09 DIAGNOSIS — H401133 Primary open-angle glaucoma, bilateral, severe stage: Secondary | ICD-10-CM

## 2020-01-09 DIAGNOSIS — H35052 Retinal neovascularization, unspecified, left eye: Secondary | ICD-10-CM

## 2020-01-09 MED ORDER — BEVACIZUMAB CHEMO INJECTION 1.25MG/0.05ML SYRINGE FOR KALEIDOSCOPE
1.2500 mg | INTRAVITREAL | Status: AC | PRN
  Administered 2020-01-09: 1.25 mg via INTRAVITREAL

## 2020-01-16 DIAGNOSIS — H401113 Primary open-angle glaucoma, right eye, severe stage: Secondary | ICD-10-CM | POA: Diagnosis not present

## 2020-01-16 DIAGNOSIS — H25811 Combined forms of age-related cataract, right eye: Secondary | ICD-10-CM | POA: Diagnosis not present

## 2020-01-16 DIAGNOSIS — H409 Unspecified glaucoma: Secondary | ICD-10-CM | POA: Diagnosis not present

## 2020-01-16 DIAGNOSIS — H2511 Age-related nuclear cataract, right eye: Secondary | ICD-10-CM | POA: Diagnosis not present

## 2020-01-22 DIAGNOSIS — H25012 Cortical age-related cataract, left eye: Secondary | ICD-10-CM | POA: Diagnosis not present

## 2020-01-22 DIAGNOSIS — H2589 Other age-related cataract: Secondary | ICD-10-CM | POA: Diagnosis not present

## 2020-01-22 DIAGNOSIS — H2512 Age-related nuclear cataract, left eye: Secondary | ICD-10-CM | POA: Diagnosis not present

## 2020-01-22 DIAGNOSIS — H25042 Posterior subcapsular polar age-related cataract, left eye: Secondary | ICD-10-CM | POA: Diagnosis not present

## 2020-02-05 DIAGNOSIS — D631 Anemia in chronic kidney disease: Secondary | ICD-10-CM | POA: Diagnosis not present

## 2020-02-05 DIAGNOSIS — N2581 Secondary hyperparathyroidism of renal origin: Secondary | ICD-10-CM | POA: Diagnosis not present

## 2020-02-05 DIAGNOSIS — E871 Hypo-osmolality and hyponatremia: Secondary | ICD-10-CM | POA: Diagnosis not present

## 2020-02-05 DIAGNOSIS — N184 Chronic kidney disease, stage 4 (severe): Secondary | ICD-10-CM | POA: Diagnosis not present

## 2020-02-05 DIAGNOSIS — I129 Hypertensive chronic kidney disease with stage 1 through stage 4 chronic kidney disease, or unspecified chronic kidney disease: Secondary | ICD-10-CM | POA: Diagnosis not present

## 2020-02-05 DIAGNOSIS — N189 Chronic kidney disease, unspecified: Secondary | ICD-10-CM | POA: Diagnosis not present

## 2020-02-06 ENCOUNTER — Encounter (INDEPENDENT_AMBULATORY_CARE_PROVIDER_SITE_OTHER): Payer: Medicare Other | Admitting: Ophthalmology

## 2020-02-06 NOTE — Progress Notes (Signed)
[facility] Note  02/07/2020     CHIEF COMPLAINT Patient presents for Retina Follow Up   HISTORY OF PRESENT ILLNESS: Danny Rhodes is a 86 y.o. male who presents to the clinic today for:   HPI    Retina Follow Up    Patient presents with  Wet AMD.  In left eye.  This started 4 weeks ago.  Severity is moderate.  I, the attending physician,  performed the HPI with the patient and updated documentation appropriately.          Comments    Patient here for 4 weeks retina follow up for exu ARMD OS. Patient states vision doing alright. No eye pain.        Last edited by Bernarda Caffey, MD on 02/07/2020 11:57 AM. (History)    pt states no problems after last injection, he feels like his vision has improved since then  Referring physician: Marshall Cork, MD [place] [address]  HISTORICAL INFORMATION:   Selected notes from the MEDICAL RECORD NUMBER Referred by Dr. Kathlen Mody LEE: 07.30.21 BCVA OD 20/40, OS 20/50 Ocular Hx- POAG OU severe, Cataracts OU, HTN Retinopathy OU, NVAMD OS, Band Keratopathy OU    CURRENT MEDICATIONS: No current outpatient medications on file. (Ophthalmic Drugs)   No current facility-administered medications for this visit. (Ophthalmic Drugs)   Current Outpatient Medications (Other)  Medication Sig  . acetaminophen (TYLENOL) 325 MG tablet Take 2 tablets (650 mg total) by mouth every 6 (six) hours as needed for up to 30 doses.  Marland Kitchen acetaminophen (TYLENOL) 500 MG tablet Take 1,000 mg by mouth 2 (two) times daily.  Marland Kitchen amLODipine (NORVASC) 10 MG tablet Take 10 mg by mouth daily.  Marland Kitchen amLODipine (NORVASC) 5 MG tablet Take 5 mg by mouth daily.  Marland Kitchen aspirin EC 81 MG tablet Take 81 mg by mouth daily.  Marland Kitchen atorvastatin (LIPITOR) 20 MG tablet Take 20 mg by mouth daily at 6 PM.  . furosemide (LASIX) 20 MG tablet Take 20 mg by mouth daily.  Marland Kitchen ibuprofen (ADVIL) 400 MG tablet Take 1 tablet (400 mg total) by mouth every  6 (six) hours as needed for up to 10 doses.  . metoprolol tartrate (LOPRESSOR) 50 MG tablet Take 50 mg by mouth 2 (two) times daily.  Marland Kitchen olmesartan-hydrochlorothiazide (BENICAR HCT) 40-25 MG tablet Take 1 tablet by mouth daily.  . traMADol (ULTRAM) 50 MG tablet Take 1 tablet (50 mg total) by mouth at bedtime as needed for up to 6 doses for severe pain.  . valsartan-hydrochlorothiazide (DIOVAN-HCT) 320-25 MG tablet Take 1 tablet by mouth daily.  . vitamin C (ASCORBIC ACID) 500 MG tablet Take 500 mg by mouth daily.   No current facility-administered medications for this visit. (Other)      REVIEW OF SYSTEMS: ROS    Positive for: Eyes   Negative for: Constitutional, Gastrointestinal, Neurological, Skin, Genitourinary, Musculoskeletal, HENT, Endocrine, Cardiovascular, Respiratory, Psychiatric, Allergic/Imm, Heme/Lymph   Last edited by Theodore Demark, COA on 02/07/2020  8:40 AM. (History)       ALLERGIES No Known Allergies  PAST MEDICAL HISTORY History reviewed. No pertinent past medical history. History reviewed. No pertinent surgical history.  FAMILY HISTORY History reviewed. No pertinent family history.  SOCIAL HISTORY Social History   Tobacco Use  . Smoking status: Not on file  Substance Use Topics  . Alcohol use: Not on file  . Drug use: Not on file  OPHTHALMIC EXAM:  Base Eye Exam    Visual Acuity (Snellen - Linear)      Right Left   Dist [place] 20/25 -2 20/60   Dist ph [place] NI NI       Tonometry (Tonopen, 8:37 AM)      Right Left   Pressure 15 10       Pupils      Dark Light Shape React APD   Right 4 3 Round Brisk None   Left 4 3 Round Brisk None       Visual Fields      Left Right    Full    Restrictions  Partial outer inferior temporal, inferior nasal deficiencies       Extraocular Movement      Right Left    Full Full       Neuro/Psych    Oriented x3: Yes   Mood/Affect: Normal       Dilation    Both eyes: 1.0% Mydriacyl, 2.5%  Phenylephrine @ 8:37 AM        Slit Lamp and Fundus Exam    Slit Lamp Exam      Right Left   Lids/Lashes Dermatochalasis - upper lid Dermatochalasis - upper lid   Conjunctiva/Sclera mild Melanosis nasal and temporal pinguecula   Cornea arcus, 1+ Punctate epithelial erosions arcus, 1+ Punctate epithelial erosions, early band K   Anterior Chamber deep and clear, narrow temporal angle deep and clear, narrow temporal angle   Iris Round and moderately dilated  Round and moderately dilated    Lens PCIOL 3-4+ Nuclear sclerosis with brunescence, 3+ Cortical cataract   Vitreous Vitreous syneresis Vitreous syneresis       Fundus Exam      Right Left   Disc Mild Pallor, +cupping, scattered PPP, thin superior rim Mild Pallor, +cupping, scattered PPP, thin inferior rim   C/D Ratio 0.75 0.8   Macula Flat, Blunted foveal reflex, RPE mottling, clumping and atrophy Blunted foveal reflex, focal pigment ring/CNV with surrounding shallow SRF IT to fovea -- improved, RPE mottling, clumping and atrophy   Vessels Vascular attenuation, mild tortuousity Vascular attenuation, mild tortuousity, focal perivascular pigment clumping temporally   Periphery Attached, No heme  Attached, focal perivascular pigment clumping temporally, No heme         Refraction    Wearing Rx      Sphere   Right None   Left None          IMAGING AND PROCEDURES  Imaging and Procedures for 02/07/2020  OCT, Retina - OU - Both Eyes       Right Eye Quality was good. Central Foveal Thickness: 217. Progression has been stable. Findings include normal foveal contour, no IRF, no SRF (Trace ERM; partial PVD).   Left Eye Quality was good. Central Foveal Thickness: 206. Progression has improved. Findings include abnormal foveal contour, subretinal hyper-reflective material, no IRF, no SRF, pigment epithelial detachment (Interval improvement in SRF).   Notes *Images captured and stored on drive  Diagnosis / Impression:  OD: NFP,  no IRF/SRF OS: focal PED/CNV w/ interval improvement in SRF  Clinical management:  See below  Abbreviations: NFP - Normal foveal profile. CME - cystoid macular edema. PED - pigment epithelial detachment. IRF - intraretinal fluid. SRF - subretinal fluid. EZ - ellipsoid zone. ERM - epiretinal membrane. ORA - outer retinal atrophy. ORT - outer retinal tubulation. SRHM - subretinal hyper-reflective material. IRHM - intraretinal hyper-reflective material  Intravitreal Injection, Pharmacologic Agent - OS - Left Eye       Time Out 02/07/2020. 9:52 AM. Confirmed correct patient, procedure, site, and patient consented.   Anesthesia Topical anesthesia was used. Anesthetic medications included Lidocaine 2%, Proparacaine 0.5%.   Procedure Preparation included 5% betadine to ocular surface, eyelid speculum. A (32g) needle was used.   Injection:  1.25 mg Bevacizumab (AVASTIN) SOLN   NDC: 67124-580-99, Lot: [id number], Expiration date: 02/18/2020   Route: Intravitreal, Site: Left Eye, Waste: 0.05 mL  Post-op Post injection exam found visual acuity of at least counting fingers. The patient tolerated the procedure well. There were no complications. The patient received written and verbal post procedure care education. Post injection medications were not given.                 ASSESSMENT/PLAN:    ICD-10-CM   1. Choroidal neovascular membrane of left eye  H35.052 Intravitreal Injection, Pharmacologic Agent - OS - Left Eye    Bevacizumab (AVASTIN) SOLN 1.25 mg  2. Retinal edema  H35.81 OCT, Retina - OU - Both Eyes  3. Exudative age-related macular degeneration of left eye with active choroidal neovascularization (HCC)  H35.3221 Intravitreal Injection, Pharmacologic Agent - OS - Left Eye    Bevacizumab (AVASTIN) SOLN 1.25 mg  4. Essential hypertension  I10   5. Hypertensive retinopathy of both eyes  H35.033   6. Combined forms of age-related cataract of left eye  H25.812   7. Pseudophakia   Z96.1   8. Primary open angle glaucoma (POAG) of both eyes, severe stage  H40.1133     1-3. Choroidal neovascular membrane w/ +SRF  - idiopathic CNV vs Exudative age related macular degeneration, OS  - interestingly, OD without drusen and OS with minimal drusen, which we would see in typical ARMD - s/p IVA OS #1 (08.04.21)  - BCVA 20/60 OS -- stable  - OCT shows interval improvement in SRF  - recommend IVA OS #2 today, 09.02.21 - pt wishes to be treated with IVA OS - RBA of procedure discussed, questions answered - informed consent obtained, signed and scanned, 08.04.21 - see procedure note  - f/u in 4 wks -- DFE/OCT, possible injection  - clear from a retina standpoint to proceed with cataract surgery when pt and surgeon are ready  4,5. Hypertensive retinopathy OU - discussed importance of tight BP control - monitor  6. Mixed Cataract OS - The symptoms of cataract, surgical options, and treatments and risks were discussed with patient. - discussed diagnosis and progression - under the expert managaement of Dr. Kathlen Mody - OS scheduled for surgery this month - clear from a retina standpoint to proceed with cataract surgery when pt and surgeon are ready  7. Pseudophakia OD  - s/p CE/IOL OD Kathlen Mody 8.11.21)  - IOL in good position, doing well  8. POAG OU  - under the expert management of Dr. Kathlen Mody  - IOP: 15,10 today  - on dorzolamide BID and latanoprost QHS per Dr. Kathlen Mody   Ophthalmic Meds Ordered this visit:  Meds ordered this encounter  Medications  . Bevacizumab (AVASTIN) SOLN 1.25 mg       Return in about 4 weeks (around 03/06/2020) for f/u CNVM OS, DFE, OCT.  There are no Patient Instructions on file for this visit.   Explained the diagnoses, plan, and follow up with the patient and they expressed understanding.  Patient expressed understanding of the importance of proper follow up care.   This document serves as  a record of services personally performed by  Gardiner Sleeper, MD, PhD. It was created on their behalf by Leonie Douglas, an ophthalmic technician. The creation of this record is the provider's dictation and/or activities during the visit.    Electronically signed by: Leonie Douglas COA, 02/07/20  12:05 PM  Gardiner Sleeper, M.D., Ph.D. Diseases & Surgery of the Retina and Vitreous [facility]  I have reviewed the above documentation for accuracy and completeness, and I agree with the above. Gardiner Sleeper, M.D., Ph.D. 02/07/20 12:05 PM   Abbreviations: M myopia (nearsighted); A astigmatism; H hyperopia (farsighted); P presbyopia; Mrx spectacle prescription;  CTL contact lenses; OD right eye; OS left eye; OU both eyes  XT exotropia; ET esotropia; PEK punctate epithelial keratitis; PEE punctate epithelial erosions; DES dry eye syndrome; MGD meibomian gland dysfunction; ATs artificial tears; PFAT's preservative free artificial tears; [place] nuclear sclerotic cataract; PSC posterior subcapsular cataract; ERM epi-retinal membrane; PVD posterior vitreous detachment; RD retinal detachment; DM diabetes mellitus; DR diabetic retinopathy; NPDR non-proliferative diabetic retinopathy; PDR proliferative diabetic retinopathy; CSME clinically significant macular edema; DME diabetic macular edema; dbh dot blot hemorrhages; CWS cotton wool spot; POAG primary open angle glaucoma; C/D cup-to-disc ratio; HVF humphrey visual field; GVF goldmann visual field; OCT optical coherence tomography; IOP intraocular pressure; BRVO Branch retinal vein occlusion; CRVO central retinal vein occlusion; CRAO central retinal artery occlusion; BRAO branch retinal artery occlusion; RT retinal tear; SB scleral buckle; PPV pars plana vitrectomy; VH Vitreous hemorrhage; PRP panretinal laser photocoagulation; IVK intravitreal kenalog; VMT vitreomacular traction; MH Macular hole;  NVD neovascularization of the disc; NVE neovascularization elsewhere; AREDS age related eye  disease study; ARMD age related macular degeneration; POAG primary open angle glaucoma; EBMD epithelial/anterior basement membrane dystrophy; ACIOL anterior chamber intraocular lens; IOL intraocular lens; PCIOL posterior chamber intraocular lens; Phaco/IOL phacoemulsification with intraocular lens placement; [place] photorefractive keratectomy; LASIK laser assisted in situ keratomileusis; HTN hypertension; DM diabetes mellitus; COPD chronic obstructive pulmonary disease

## 2020-02-07 ENCOUNTER — Encounter (INDEPENDENT_AMBULATORY_CARE_PROVIDER_SITE_OTHER): Payer: Self-pay | Admitting: Ophthalmology

## 2020-02-07 ENCOUNTER — Other Ambulatory Visit: Payer: Self-pay

## 2020-02-07 ENCOUNTER — Ambulatory Visit (INDEPENDENT_AMBULATORY_CARE_PROVIDER_SITE_OTHER): Payer: Medicare Other | Admitting: Ophthalmology

## 2020-02-07 DIAGNOSIS — H401133 Primary open-angle glaucoma, bilateral, severe stage: Secondary | ICD-10-CM

## 2020-02-07 DIAGNOSIS — H3581 Retinal edema: Secondary | ICD-10-CM

## 2020-02-07 DIAGNOSIS — I10 Essential (primary) hypertension: Secondary | ICD-10-CM

## 2020-02-07 DIAGNOSIS — H35033 Hypertensive retinopathy, bilateral: Secondary | ICD-10-CM | POA: Diagnosis not present

## 2020-02-07 DIAGNOSIS — H35052 Retinal neovascularization, unspecified, left eye: Secondary | ICD-10-CM

## 2020-02-07 DIAGNOSIS — Z961 Presence of intraocular lens: Secondary | ICD-10-CM

## 2020-02-07 DIAGNOSIS — H25812 Combined forms of age-related cataract, left eye: Secondary | ICD-10-CM

## 2020-02-07 DIAGNOSIS — H353221 Exudative age-related macular degeneration, left eye, with active choroidal neovascularization: Secondary | ICD-10-CM

## 2020-02-07 MED ORDER — BEVACIZUMAB CHEMO INJECTION 1.25MG/0.05ML SYRINGE FOR KALEIDOSCOPE
1.2500 mg | INTRAVITREAL | Status: AC | PRN
  Administered 2020-02-07: 1.25 mg via INTRAVITREAL

## 2020-02-19 DIAGNOSIS — N184 Chronic kidney disease, stage 4 (severe): Secondary | ICD-10-CM | POA: Diagnosis not present

## 2020-02-19 DIAGNOSIS — I129 Hypertensive chronic kidney disease with stage 1 through stage 4 chronic kidney disease, or unspecified chronic kidney disease: Secondary | ICD-10-CM | POA: Diagnosis not present

## 2020-02-20 DIAGNOSIS — H2512 Age-related nuclear cataract, left eye: Secondary | ICD-10-CM | POA: Diagnosis not present

## 2020-02-20 DIAGNOSIS — H401123 Primary open-angle glaucoma, left eye, severe stage: Secondary | ICD-10-CM | POA: Diagnosis not present

## 2020-02-20 DIAGNOSIS — H25042 Posterior subcapsular polar age-related cataract, left eye: Secondary | ICD-10-CM | POA: Diagnosis not present

## 2020-02-20 DIAGNOSIS — H25812 Combined forms of age-related cataract, left eye: Secondary | ICD-10-CM | POA: Diagnosis not present

## 2020-02-20 DIAGNOSIS — H25012 Cortical age-related cataract, left eye: Secondary | ICD-10-CM | POA: Diagnosis not present

## 2020-03-06 NOTE — Progress Notes (Signed)
[facility] Note  03/11/2020     CHIEF COMPLAINT Patient presents for Retina Follow Up   HISTORY OF PRESENT ILLNESS: Danny Rhodes is a 86 y.o. male who presents to the clinic today for:  HPI    Retina Follow Up    Patient presents with  Other.  In left eye.  Duration of 4 weeks.  Since onset it is stable.  I, the attending physician,  performed the HPI with the patient and updated documentation appropriately.          Comments    4 week CNV vs Exu ARMD OS- Doing well, vision stable, no new problems since last visit OU.  Using Dorzolamide BID and Latanoprost qhs OU.        Last edited by Bernarda Caffey, MD on 03/11/2020  9:56 AM. (History)    Pt states he has had cataract surgery OS with Dr. Kathlen Mody on 09.22.21, he states it has helped his vision  Referring physician: Marshall Cork, MD [place] [id number]  HISTORICAL INFORMATION:   Selected notes from the MEDICAL RECORD NUMBER Referred by Dr. Kathlen Mody LEE: 07.30.21 BCVA OD 20/40, OS 20/50 Ocular Hx- POAG OU severe, Cataracts OU, HTN Retinopathy OU, NVAMD OS, Band Keratopathy OU    CURRENT MEDICATIONS: No current outpatient medications on file. (Ophthalmic Drugs)   No current facility-administered medications for this visit. (Ophthalmic Drugs)   Current Outpatient Medications (Other)  Medication Sig  . acetaminophen (TYLENOL) 325 MG tablet Take 2 tablets (650 mg total) by mouth every 6 (six) hours as needed for up to 30 doses.  Marland Kitchen acetaminophen (TYLENOL) 500 MG tablet Take 1,000 mg by mouth 2 (two) times daily.  Marland Kitchen amLODipine (NORVASC) 10 MG tablet Take 10 mg by mouth daily.  Marland Kitchen amLODipine (NORVASC) 5 MG tablet Take 5 mg by mouth daily.  Marland Kitchen aspirin EC 81 MG tablet Take 81 mg by mouth daily.  Marland Kitchen atorvastatin (LIPITOR) 20 MG tablet Take 20 mg by mouth daily at 6 PM.  . furosemide (LASIX) 20 MG tablet Take 20 mg by mouth daily.  Marland Kitchen ibuprofen (ADVIL) 400 MG tablet Take 1  tablet (400 mg total) by mouth every 6 (six) hours as needed for up to 10 doses.  . metoprolol tartrate (LOPRESSOR) 50 MG tablet Take 50 mg by mouth 2 (two) times daily.  Marland Kitchen olmesartan-hydrochlorothiazide (BENICAR HCT) 40-25 MG tablet Take 1 tablet by mouth daily.  . traMADol (ULTRAM) 50 MG tablet Take 1 tablet (50 mg total) by mouth at bedtime as needed for up to 6 doses for severe pain.  . valsartan-hydrochlorothiazide (DIOVAN-HCT) 320-25 MG tablet Take 1 tablet by mouth daily.  . vitamin C (ASCORBIC ACID) 500 MG tablet Take 500 mg by mouth daily.   No current facility-administered medications for this visit. (Other)      REVIEW OF SYSTEMS: ROS    Positive for: Eyes   Negative for: Constitutional, Gastrointestinal, Neurological, Skin, Genitourinary, Musculoskeletal, HENT, Endocrine, Cardiovascular, Respiratory, Psychiatric, Allergic/Imm, Heme/Lymph   Last edited by Leonie Douglas, COA on 03/11/2020  9:26 AM. (History)       ALLERGIES No Known Allergies  PAST MEDICAL HISTORY History reviewed. No pertinent past medical history. History reviewed. No pertinent surgical history.  FAMILY HISTORY History reviewed. No pertinent family history.  SOCIAL HISTORY Social History   Tobacco Use  . Smoking status: Not on file  Substance Use Topics  . Alcohol use: Not on file  .  Drug use: Not on file         OPHTHALMIC EXAM:  Base Eye Exam    Visual Acuity (Snellen - Linear)      Right Left   Dist [place] 20/30 20/40 +2   Dist ph [place] NI NI       Tonometry (Tonopen, 9:33 AM)      Right Left   Pressure 14 13       Pupils      Dark Light Shape React APD   Right 4 3 Round Brisk None   Left 4 3 Round Brisk None       Visual Fields (Counting fingers)      Left Right    Full Full       Neuro/Psych    Oriented x3: Yes   Mood/Affect: Normal       Dilation    Both eyes: 1.0% Mydriacyl, 2.5% Phenylephrine @ 9:33 AM        Slit Lamp and Fundus Exam    Slit Lamp Exam       Right Left   Lids/Lashes Dermatochalasis - upper lid Dermatochalasis - upper lid   Conjunctiva/Sclera mild Melanosis Mild melanosis, temporal pinguecula   Cornea arcus, 1-2+ Punctate epithelial erosions arcus, 1+ Punctate epithelial erosions, early band K, well healed temporal cataract wounds, mild central haze   Anterior Chamber deep and clear, narrow temporal angle deep and narrow temporal angle, 0.5+cell/pigment   Iris Round and moderately dilated  Round and moderately dilated    Lens PCIOL PC IOL in good position   Vitreous Vitreous syneresis Vitreous syneresis       Fundus Exam      Right Left   Disc Mild Pallor, +cupping, scattered PPP, thin superior rim Mild Pallor, +cupping, scattered PPP, thin inferior rim   C/D Ratio 0.75 0.8   Macula Flat, Blunted foveal reflex, RPE mottling, clumping and atrophy, Drusen, No heme or edema Blunted foveal reflex, focal pigment ring/CNV with stable improvement in shallow SRF IT to fovea, RPE mottling, clumping and atrophy, no heme   Vessels Vascular attenuation, mild tortuousity Vascular attenuation, mild tortuousity, focal perivascular pigment clumping temporally   Periphery Attached, No heme  Attached, focal perivascular pigment clumping temporally, No heme           IMAGING AND PROCEDURES  Imaging and Procedures for 03/11/2020  OCT, Retina - OU - Both Eyes       Right Eye Quality was good. Central Foveal Thickness: 220. Progression has been stable. Findings include normal foveal contour, no IRF, no SRF (Trace ERM; partial PVD).   Left Eye Quality was good. Central Foveal Thickness: 202. Progression has improved. Findings include abnormal foveal contour, subretinal hyper-reflective material, no IRF, no SRF, pigment epithelial detachment (Stable focal PED, Stable improvement in SRF/IRHM overlying PED, persistent ORA surrounding PED).   Notes *Images captured and stored on drive  Diagnosis / Impression:  OD: NFP, no IRF/SRF OS: Stable  focal PED, Stable improvement in SRF/IRHM overlying PED, persistent ORA surrounding PED  Clinical management:  See below  Abbreviations: NFP - Normal foveal profile. CME - cystoid macular edema. PED - pigment epithelial detachment. IRF - intraretinal fluid. SRF - subretinal fluid. EZ - ellipsoid zone. ERM - epiretinal membrane. ORA - outer retinal atrophy. ORT - outer retinal tubulation. SRHM - subretinal hyper-reflective material. IRHM - intraretinal hyper-reflective material       Intravitreal Injection, Pharmacologic Agent - OS - Left Eye  Time Out 03/11/2020. 9:55 AM. Confirmed correct patient, procedure, site, and patient consented.   Anesthesia Topical anesthesia was used. Anesthetic medications included Lidocaine 2%, Proparacaine 0.5%.   Procedure Preparation included 5% betadine to ocular surface, eyelid speculum. A (32g) needle was used.   Injection:  1.25 mg Bevacizumab (AVASTIN) SOLN   NDC: 91694-503-88, Lot: [id number], Expiration date: 04/13/2020   Route: Intravitreal, Site: Left Eye, Waste: 0.05 mL  Post-op Post injection exam found visual acuity of at least counting fingers. The patient tolerated the procedure well. There were no complications. The patient received written and verbal post procedure care education. Post injection medications were not given.                 ASSESSMENT/PLAN:    ICD-10-CM   1. Choroidal neovascular membrane of left eye  H35.052 Intravitreal Injection, Pharmacologic Agent - OS - Left Eye    Bevacizumab (AVASTIN) SOLN 1.25 mg  2. Exudative age-related macular degeneration of left eye with active choroidal neovascularization (HCC)  H35.3221 Intravitreal Injection, Pharmacologic Agent - OS - Left Eye    Bevacizumab (AVASTIN) SOLN 1.25 mg  3. Retinal edema  H35.81 OCT, Retina - OU - Both Eyes  4. Essential hypertension  I10   5. Hypertensive retinopathy of both eyes  H35.033   6. Pseudophakia, both eyes  Z96.1   7. Primary open  angle glaucoma (POAG) of both eyes, severe stage  H40.1133     1-3. Choroidal neovascular membrane w/ +SRF  - idiopathic CNV vs Exudative age related macular degeneration, OS  - interestingly, OD without drusen and OS with minimal drusen, which we would see in typical ARMD   - s/p IVA OS #1 (08.04.21), #2 (09.02.21)  - BCVA 20/40 OS -- improved (mostly from recent cataract surgery OS)  - OCT shows stable PED, stable improvement in SRF/IRHM overlying PED  - recommend IVA OS #3 today, 10.05.21 - with extension to 5 weeks - pt wishes to be treated with IVA OS - RBA of procedure discussed, questions answered - informed consent obtained, signed and scanned, 08.04.21 - see procedure note  - f/u in 5 wks -- DFE/OCT, possible injection -- tx and extend as able  4,5. Hypertensive retinopathy OU - discussed importance of tight BP control - monitor  6. Pseudophakia OU  - s/p CE/IOL ([place], OD 8.11.21; OS: 09.22.21)  - IOLs in good position, doing well  7. POAG OU  - under the expert management of Dr. Kathlen Mody  - IOP: 14,13 today  - on dorzolamide BID and latanoprost QHS per Dr. Kathlen Mody   Ophthalmic Meds Ordered this visit:  Meds ordered this encounter  Medications  . Bevacizumab (AVASTIN) SOLN 1.25 mg       Return in about 5 weeks (around 04/15/2020) for f/yu CNV OS, DFE, OCT.  There are no Patient Instructions on file for this visit.   Explained the diagnoses, plan, and follow up with the patient and they expressed understanding.  Patient expressed understanding of the importance of proper follow up care.   This document serves as a record of services personally performed by Gardiner Sleeper, MD, PhD. It was created on their behalf by Estill Bakes, COT an ophthalmic technician. The creation of this record is the provider's dictation and/or activities during the visit.    Electronically signed by: Estill Bakes, COT 9.30.21 @ 11:19 AM  Gardiner Sleeper, M.D., Ph.D. Diseases &  Surgery of the Retina and [place]  03/11/2020   I have reviewed the above documentation for accuracy and completeness, and I agree with the above. Gardiner Sleeper, M.D., Ph.D. 03/11/20 11:19 AM   Abbreviations: M myopia (nearsighted); A astigmatism; H hyperopia (farsighted); P presbyopia; Mrx spectacle prescription;  CTL contact lenses; OD right eye; OS left eye; OU both eyes  XT exotropia; ET esotropia; PEK punctate epithelial keratitis; PEE punctate epithelial erosions; DES dry eye syndrome; MGD meibomian gland dysfunction; ATs artificial tears; PFAT's preservative free artificial tears; [place] nuclear sclerotic cataract; PSC posterior subcapsular cataract; ERM epi-retinal membrane; PVD posterior vitreous detachment; RD retinal detachment; DM diabetes mellitus; DR diabetic retinopathy; NPDR non-proliferative diabetic retinopathy; PDR proliferative diabetic retinopathy; CSME clinically significant macular edema; DME diabetic macular edema; dbh dot blot hemorrhages; CWS cotton wool spot; POAG primary open angle glaucoma; C/D cup-to-disc ratio; HVF humphrey visual field; GVF goldmann visual field; OCT optical coherence tomography; IOP intraocular pressure; BRVO Branch retinal vein occlusion; CRVO central retinal vein occlusion; CRAO central retinal artery occlusion; BRAO branch retinal artery occlusion; RT retinal tear; SB scleral buckle; PPV pars plana vitrectomy; VH Vitreous hemorrhage; PRP panretinal laser photocoagulation; IVK intravitreal kenalog; VMT vitreomacular traction; MH Macular hole;  NVD neovascularization of the disc; NVE neovascularization elsewhere; AREDS age related eye disease study; ARMD age related macular degeneration; POAG primary open angle glaucoma; EBMD epithelial/anterior basement membrane dystrophy; ACIOL anterior chamber intraocular lens; IOL intraocular lens; PCIOL posterior chamber intraocular lens; Phaco/IOL phacoemulsification with intraocular  lens placement; [place] photorefractive keratectomy; LASIK laser assisted in situ keratomileusis; HTN hypertension; DM diabetes mellitus; COPD chronic obstructive pulmonary disease

## 2020-03-11 ENCOUNTER — Ambulatory Visit (INDEPENDENT_AMBULATORY_CARE_PROVIDER_SITE_OTHER): Payer: Medicare Other | Admitting: Ophthalmology

## 2020-03-11 ENCOUNTER — Other Ambulatory Visit: Payer: Self-pay

## 2020-03-11 ENCOUNTER — Encounter (INDEPENDENT_AMBULATORY_CARE_PROVIDER_SITE_OTHER): Payer: Self-pay | Admitting: Ophthalmology

## 2020-03-11 DIAGNOSIS — H3581 Retinal edema: Secondary | ICD-10-CM

## 2020-03-11 DIAGNOSIS — H401133 Primary open-angle glaucoma, bilateral, severe stage: Secondary | ICD-10-CM

## 2020-03-11 DIAGNOSIS — H35033 Hypertensive retinopathy, bilateral: Secondary | ICD-10-CM | POA: Diagnosis not present

## 2020-03-11 DIAGNOSIS — H35052 Retinal neovascularization, unspecified, left eye: Secondary | ICD-10-CM

## 2020-03-11 DIAGNOSIS — H353221 Exudative age-related macular degeneration, left eye, with active choroidal neovascularization: Secondary | ICD-10-CM | POA: Diagnosis not present

## 2020-03-11 DIAGNOSIS — Z961 Presence of intraocular lens: Secondary | ICD-10-CM

## 2020-03-11 DIAGNOSIS — I10 Essential (primary) hypertension: Secondary | ICD-10-CM

## 2020-03-11 MED ORDER — BEVACIZUMAB CHEMO INJECTION 1.25MG/0.05ML SYRINGE FOR KALEIDOSCOPE
1.2500 mg | INTRAVITREAL | Status: AC | PRN
  Administered 2020-03-11: 1.25 mg via INTRAVITREAL

## 2020-03-20 DIAGNOSIS — H353221 Exudative age-related macular degeneration, left eye, with active choroidal neovascularization: Secondary | ICD-10-CM | POA: Diagnosis not present

## 2020-03-25 DIAGNOSIS — Z23 Encounter for immunization: Secondary | ICD-10-CM | POA: Diagnosis not present

## 2020-04-15 ENCOUNTER — Encounter (INDEPENDENT_AMBULATORY_CARE_PROVIDER_SITE_OTHER): Payer: Medicare Other | Admitting: Ophthalmology

## 2020-04-16 NOTE — Progress Notes (Signed)
[facility] Note  04/17/2020     CHIEF COMPLAINT Patient presents for Retina Follow Up   HISTORY OF PRESENT ILLNESS: Danny Rhodes is a 86 y.o. male who presents to the clinic today for:  HPI    Retina Follow Up    Patient presents with  Other.  In left eye.  This started 5 weeks ago.  I, the attending physician,  performed the HPI with the patient and updated documentation appropriately.          Comments    Patient here for 5 weeks retina follow up for CNV OS. Patient states vision doing good. No eye pain.        Last edited by Bernarda Caffey, MD on 04/17/2020 10:46 PM. (History)    Pt states he has had cataract surgery OS with Dr. Kathlen Mody on 09.22.21, he states it has helped his vision  Referring physician: Marshall Cork, MD [place] [id number]  HISTORICAL INFORMATION:   Selected notes from the MEDICAL RECORD NUMBER Referred by Dr. Kathlen Mody LEE: 07.30.21 BCVA OD 20/40, OS 20/50 Ocular Hx- POAG OU severe, Cataracts OU, HTN Retinopathy OU, NVAMD OS, Band Keratopathy OU    CURRENT MEDICATIONS: No current outpatient medications on file. (Ophthalmic Drugs)   No current facility-administered medications for this visit. (Ophthalmic Drugs)   Current Outpatient Medications (Other)  Medication Sig  . acetaminophen (TYLENOL) 325 MG tablet Take 2 tablets (650 mg total) by mouth every 6 (six) hours as needed for up to 30 doses.  Marland Kitchen acetaminophen (TYLENOL) 500 MG tablet Take 1,000 mg by mouth 2 (two) times daily.  Marland Kitchen amLODipine (NORVASC) 10 MG tablet Take 10 mg by mouth daily.  Marland Kitchen amLODipine (NORVASC) 5 MG tablet Take 5 mg by mouth daily.  Marland Kitchen aspirin EC 81 MG tablet Take 81 mg by mouth daily.  Marland Kitchen atorvastatin (LIPITOR) 20 MG tablet Take 20 mg by mouth daily at 6 PM.  . furosemide (LASIX) 20 MG tablet Take 20 mg by mouth daily.  Marland Kitchen ibuprofen (ADVIL) 400 MG tablet Take 1 tablet (400 mg total) by mouth every 6 (six) hours as  needed for up to 10 doses.  . metoprolol tartrate (LOPRESSOR) 50 MG tablet Take 50 mg by mouth 2 (two) times daily.  Marland Kitchen olmesartan-hydrochlorothiazide (BENICAR HCT) 40-25 MG tablet Take 1 tablet by mouth daily.  . traMADol (ULTRAM) 50 MG tablet Take 1 tablet (50 mg total) by mouth at bedtime as needed for up to 6 doses for severe pain.  . valsartan-hydrochlorothiazide (DIOVAN-HCT) 320-25 MG tablet Take 1 tablet by mouth daily.  . vitamin C (ASCORBIC ACID) 500 MG tablet Take 500 mg by mouth daily.   No current facility-administered medications for this visit. (Other)      REVIEW OF SYSTEMS: ROS    Positive for: Eyes   Negative for: Constitutional, Gastrointestinal, Neurological, Skin, Genitourinary, Musculoskeletal, HENT, Endocrine, Cardiovascular, Respiratory, Psychiatric, Allergic/Imm, Heme/Lymph   Last edited by Theodore Demark, COA on 04/17/2020  8:45 AM. (History)       ALLERGIES No Known Allergies  PAST MEDICAL HISTORY History reviewed. No pertinent past medical history. History reviewed. No pertinent surgical history.  FAMILY HISTORY History reviewed. No pertinent family history.  SOCIAL HISTORY Social History   Tobacco Use  . Smoking status: Not on file  Substance Use Topics  . Alcohol use: Not on file  . Drug use: Not on file  OPHTHALMIC EXAM:  Base Eye Exam    Visual Acuity (Snellen - Linear)      Right Left   Dist [place] 20/25 -1 20/30 +1   Dist ph [place] NI NI       Tonometry (Tonopen, 8:42 AM)      Right Left   Pressure 11 11       Pupils      Dark Light Shape React APD   Right 4 3 Round Brisk None   Left 4 3 Round Brisk None       Visual Fields (Counting fingers)      Left Right    Full Full       Extraocular Movement      Right Left    Full Full       Neuro/Psych    Oriented x3: Yes   Mood/Affect: Normal       Dilation    Both eyes: 1.0% Mydriacyl, 2.5% Phenylephrine @ 8:42 AM        Slit Lamp and Fundus Exam    Slit  Lamp Exam      Right Left   Lids/Lashes Dermatochalasis - upper lid Dermatochalasis - upper lid   Conjunctiva/Sclera mild Melanosis Mild melanosis, temporal pinguecula   Cornea arcus, 1-2+ Punctate epithelial erosions arcus, 1+ Punctate epithelial erosions, early band K, well healed temporal cataract wounds, mild central haze   Anterior Chamber deep and clear, narrow temporal angle deep and narrow temporal angle, 0.5+cell/pigment   Iris Round and moderately dilated  Round and moderately dilated    Lens PCIOL PC IOL in good position   Vitreous Vitreous syneresis Vitreous syneresis       Fundus Exam      Right Left   Disc 2+Pallor, +cupping, scattered PPP, thin superior rim 2+Pallor, +cupping, scattered PPP, thin inferior rim   C/D Ratio 0.6 0.7   Macula Flat, Blunted foveal reflex, RPE mottling, clumping and atrophy, Drusen, No heme or edema Blunted foveal reflex, focal pigment ring/CNV with stable improvement in shallow SRF IT to fovea, RPE mottling, clumping and atrophy, no heme   Vessels Vascular attenuation, mild tortuousity Vascular attenuation, mild tortuousity, focal perivascular pigment clumping temporally   Periphery Attached, No heme  Attached, focal perivascular pigment clumping temporally, No heme           IMAGING AND PROCEDURES  Imaging and Procedures for 04/17/2020  OCT, Retina - OU - Both Eyes       Right Eye Quality was good. Central Foveal Thickness: 220. Progression has been stable. Findings include normal foveal contour, no IRF, no SRF (Trace ERM; partial PVD).   Left Eye Quality was good. Central Foveal Thickness: 208. Progression has improved. Findings include abnormal foveal contour, subretinal hyper-reflective material, no IRF, no SRF, pigment epithelial detachment (Stable focal PED, interval improvement in cystic changes overlying PED, Stable improvement in SRF/IRHM overlying PED, persistent ORA surrounding PED).   Notes *Images captured and stored on  drive  Diagnosis / Impression:  OD: NFP, no IRF/SRF OS: Stable focal PED, interval improvement in cystic changes overlying PED, Stable improvement in SRF/IRHM overlying PED, persistent ORA surrounding PED   Clinical management:  See below  Abbreviations: NFP - Normal foveal profile. CME - cystoid macular edema. PED - pigment epithelial detachment. IRF - intraretinal fluid. SRF - subretinal fluid. EZ - ellipsoid zone. ERM - epiretinal membrane. ORA - outer retinal atrophy. ORT - outer retinal tubulation. SRHM - subretinal hyper-reflective material. IRHM - intraretinal hyper-reflective material  Intravitreal Injection, Pharmacologic Agent - OS - Left Eye       Time Out 04/17/2020. 9:59 AM. Confirmed correct patient, procedure, site, and patient consented.   Anesthesia Topical anesthesia was used. Anesthetic medications included Lidocaine 2%, Tetracaine 0.5%.   Procedure Preparation included 5% betadine to ocular surface, eyelid speculum. A supplied needle was used.   Injection:  1.25 mg Bevacizumab (AVASTIN) SOLN   NDC: 95284-132-44, Lot: [id number] , Expiration date: 06/03/2020   Route: Intravitreal, Site: Left Eye, Waste: 0 mL  Post-op Post injection exam found visual acuity of at least counting fingers. The patient tolerated the procedure well. There were no complications. The patient received written and verbal post procedure care education. Post injection medications were not given.                 ASSESSMENT/PLAN:    ICD-10-CM   1. Choroidal neovascular membrane of left eye  H35.052 Intravitreal Injection, Pharmacologic Agent - OS - Left Eye    Bevacizumab (AVASTIN) SOLN 1.25 mg  2. Exudative age-related macular degeneration of left eye with active choroidal neovascularization (HCC)  H35.3221 Intravitreal Injection, Pharmacologic Agent - OS - Left Eye    Bevacizumab (AVASTIN) SOLN 1.25 mg  3. Retinal edema  H35.81 OCT, Retina - OU - Both Eyes  4. Essential  hypertension  I10   5. Hypertensive retinopathy of both eyes  H35.033   6. Pseudophakia, both eyes  Z96.1   7. Primary open angle glaucoma (POAG) of both eyes, severe stage  H40.1133     1-3. Choroidal neovascular membrane w/ +SRF  - idiopathic CNV vs Exudative age related macular degeneration, OS  - interestingly, OD without drusen and OS with minimal drusen, which we would see in typical ARMD   - s/p IVA OS #1 (08.04.21), #2 (09.02.21), #3 (10.05.21)  - BCVA 20/30 -- improved from 20/40 OS at extended 5 wk interval  - OCT shows stable PED, stable improvement in SRF/IRHM overlying PED, interval improvement in IRF/cystic changes  - recommend IVA OS #4 today, 11.11.21 - with extension to 6 weeks - pt wishes to be treated with IVA OS - RBA of procedure discussed, questions answered - informed consent obtained, signed and scanned, 08.04.21 - see procedure note  - f/u in 6 wks -- DFE/OCT, possible injection -- tx and extend as able  4,5. Hypertensive retinopathy OU - discussed importance of tight BP control - monitor  6. Pseudophakia OU  - s/p CE/IOL ([place], OD 8.11.21; OS: 09.22.21)  - IOLs in good position, doing well  7. POAG OU  - under the expert management of Dr. 10.05.21  - IOP 11 OU today  - on dorzolamide BID and latanoprost QHS per Dr. Kathlen Mody   Ophthalmic Meds Ordered this visit:  Meds ordered this encounter  Medications  . Bevacizumab (AVASTIN) SOLN 1.25 mg       Return in about 6 weeks (around 05/29/2020) for f/u CNVM OS, DFE, OCT.  There are no Patient Instructions on file for this visit.   Explained the diagnoses, plan, and follow up with the patient and they expressed understanding.  Patient expressed understanding of the importance of proper follow up care.   This document serves as a record of services personally performed by 06/11/2020, MD, PhD. It was created on their behalf by Gardiner Sleeper, an ophthalmic technician. The creation of this record is  the provider's dictation and/or activities during the visit.    Electronically signed by: Leonie Douglas COA, 04/17/20  10:51 PM   This document serves as a record of services personally performed by Gardiner Sleeper, MD, PhD. It was created on their behalf by San Jetty. Owens Shark, OA an ophthalmic technician. The creation of this record is the provider's dictation and/or activities during the visit.    Electronically signed by: San Jetty. Owens Shark, [place] 11.11.2021 10:51 PM  Gardiner Sleeper, M.D., Ph.D. Diseases & Surgery of the Retina and Vitreous [facility]  I have reviewed the above documentation for accuracy and completeness, and I agree with the above. Gardiner Sleeper, M.D., Ph.D. 04/17/20 10:51 PM  Abbreviations: M myopia (nearsighted); A astigmatism; H hyperopia (farsighted); P presbyopia; Mrx spectacle prescription;  CTL contact lenses; OD right eye; OS left eye; OU both eyes  XT exotropia; ET esotropia; PEK punctate epithelial keratitis; PEE punctate epithelial erosions; DES dry eye syndrome; MGD meibomian gland dysfunction; ATs artificial tears; PFAT's preservative free artificial tears; [place] nuclear sclerotic cataract; PSC posterior subcapsular cataract; ERM epi-retinal membrane; PVD posterior vitreous detachment; RD retinal detachment; DM diabetes mellitus; DR diabetic retinopathy; NPDR non-proliferative diabetic retinopathy; PDR proliferative diabetic retinopathy; CSME clinically significant macular edema; DME diabetic macular edema; dbh dot blot hemorrhages; CWS cotton wool spot; POAG primary open angle glaucoma; C/D cup-to-disc ratio; HVF humphrey visual field; GVF goldmann visual field; OCT optical coherence tomography; IOP intraocular pressure; BRVO Branch retinal vein occlusion; CRVO central retinal vein occlusion; CRAO central retinal artery occlusion; BRAO branch retinal artery occlusion; RT retinal tear; SB scleral buckle; PPV pars plana vitrectomy; VH Vitreous  hemorrhage; PRP panretinal laser photocoagulation; IVK intravitreal kenalog; VMT vitreomacular traction; MH Macular hole;  NVD neovascularization of the disc; NVE neovascularization elsewhere; AREDS age related eye disease study; ARMD age related macular degeneration; POAG primary open angle glaucoma; EBMD epithelial/anterior basement membrane dystrophy; ACIOL anterior chamber intraocular lens; IOL intraocular lens; PCIOL posterior chamber intraocular lens; Phaco/IOL phacoemulsification with intraocular lens placement; [place] photorefractive keratectomy; LASIK laser assisted in situ keratomileusis; HTN hypertension; DM diabetes mellitus; COPD chronic obstructive pulmonary disease

## 2020-04-17 ENCOUNTER — Ambulatory Visit (INDEPENDENT_AMBULATORY_CARE_PROVIDER_SITE_OTHER): Payer: Medicare Other | Admitting: Ophthalmology

## 2020-04-17 ENCOUNTER — Other Ambulatory Visit: Payer: Self-pay

## 2020-04-17 ENCOUNTER — Encounter (INDEPENDENT_AMBULATORY_CARE_PROVIDER_SITE_OTHER): Payer: Self-pay | Admitting: Ophthalmology

## 2020-04-17 DIAGNOSIS — H3581 Retinal edema: Secondary | ICD-10-CM | POA: Diagnosis not present

## 2020-04-17 DIAGNOSIS — H353221 Exudative age-related macular degeneration, left eye, with active choroidal neovascularization: Secondary | ICD-10-CM

## 2020-04-17 DIAGNOSIS — H35052 Retinal neovascularization, unspecified, left eye: Secondary | ICD-10-CM | POA: Diagnosis not present

## 2020-04-17 DIAGNOSIS — Z961 Presence of intraocular lens: Secondary | ICD-10-CM

## 2020-04-17 DIAGNOSIS — I10 Essential (primary) hypertension: Secondary | ICD-10-CM | POA: Diagnosis not present

## 2020-04-17 DIAGNOSIS — H35033 Hypertensive retinopathy, bilateral: Secondary | ICD-10-CM

## 2020-04-17 DIAGNOSIS — H401133 Primary open-angle glaucoma, bilateral, severe stage: Secondary | ICD-10-CM

## 2020-04-17 MED ORDER — BEVACIZUMAB CHEMO INJECTION 1.25MG/0.05ML SYRINGE FOR KALEIDOSCOPE
1.2500 mg | INTRAVITREAL | Status: AC | PRN
  Administered 2020-04-17: 1.25 mg via INTRAVITREAL

## 2020-05-26 DIAGNOSIS — N184 Chronic kidney disease, stage 4 (severe): Secondary | ICD-10-CM | POA: Diagnosis not present

## 2020-05-26 DIAGNOSIS — N2581 Secondary hyperparathyroidism of renal origin: Secondary | ICD-10-CM | POA: Diagnosis not present

## 2020-05-26 DIAGNOSIS — I129 Hypertensive chronic kidney disease with stage 1 through stage 4 chronic kidney disease, or unspecified chronic kidney disease: Secondary | ICD-10-CM | POA: Diagnosis not present

## 2020-05-26 DIAGNOSIS — E871 Hypo-osmolality and hyponatremia: Secondary | ICD-10-CM | POA: Diagnosis not present

## 2020-05-26 DIAGNOSIS — N189 Chronic kidney disease, unspecified: Secondary | ICD-10-CM | POA: Diagnosis not present

## 2020-05-26 DIAGNOSIS — D631 Anemia in chronic kidney disease: Secondary | ICD-10-CM | POA: Diagnosis not present

## 2020-05-27 NOTE — Progress Notes (Signed)
[facility] Note  05/29/2020     CHIEF COMPLAINT Patient presents for Retina Follow Up   HISTORY OF PRESENT ILLNESS: Danny Rhodes is a 86 y.o. male who presents to the clinic today for:  HPI    Retina Follow Up    Patient presents with  Other.  In left eye.  This started months ago.  Severity is moderate.  Duration of 6 weeks.  Since onset it is stable.  I, the attending physician,  performed the HPI with the patient and updated documentation appropriately.          Comments    86 y/o male pt here for 6 wk f/u for CNV membrane w/+SRF OS.  No change in [place] OU.  Denies pain, FOL, floaters.  Dorzolamide BID OU Latanoprost QHS OU       Last edited by Bernarda Caffey, MD on 05/30/2020  1:51 AM. (History)    Pt states vision is stable  Referring physician: Marshall Cork, MD [place] [id number]  HISTORICAL INFORMATION:   Selected notes from the MEDICAL RECORD NUMBER Referred by Dr. Kathlen Mody LEE: 07.30.21 BCVA OD 20/40, OS 20/50 Ocular Hx- POAG OU severe, Cataracts OU, HTN Retinopathy OU, NVAMD OS, Band Keratopathy OU    CURRENT MEDICATIONS: Current Outpatient Medications (Ophthalmic Drugs)  Medication Sig  . dorzolamide (TRUSOPT) 2 % ophthalmic solution 1 drop into affected eye  . latanoprost (XALATAN) 0.005 % ophthalmic solution 1 drop into affected eye in the evening   No current facility-administered medications for this visit. (Ophthalmic Drugs)   Current Outpatient Medications (Other)  Medication Sig  . acetaminophen (TYLENOL) 325 MG tablet Take 2 tablets (650 mg total) by mouth every 6 (six) hours as needed for up to 30 doses.  Marland Kitchen acetaminophen (TYLENOL) 500 MG tablet Take 1,000 mg by mouth 2 (two) times daily.  Marland Kitchen amLODipine (NORVASC) 10 MG tablet Take 10 mg by mouth daily.  Marland Kitchen amLODipine (NORVASC) 5 MG tablet Take 5 mg by mouth daily.  Marland Kitchen aspirin EC 81 MG tablet Take 81 mg by mouth daily.  Marland Kitchen atorvastatin  (LIPITOR) 20 MG tablet Take 20 mg by mouth daily at 6 PM.  . furosemide (LASIX) 20 MG tablet Take 20 mg by mouth daily.  Marland Kitchen ibuprofen (ADVIL) 400 MG tablet Take 1 tablet (400 mg total) by mouth every 6 (six) hours as needed for up to 10 doses.  . metoprolol tartrate (LOPRESSOR) 50 MG tablet Take 50 mg by mouth 2 (two) times daily.  Marland Kitchen olmesartan-hydrochlorothiazide (BENICAR HCT) 40-25 MG tablet Take 1 tablet by mouth daily.  . potassium chloride SA (KLOR-CON) 20 MEQ tablet potassium chloride ER 20 mEq tablet,extended release(part/cryst)  . traMADol (ULTRAM) 50 MG tablet Take 1 tablet (50 mg total) by mouth at bedtime as needed for up to 6 doses for severe pain.  . valsartan-hydrochlorothiazide (DIOVAN-HCT) 320-25 MG tablet Take 1 tablet by mouth daily.  . vitamin C (ASCORBIC ACID) 500 MG tablet Take 500 mg by mouth daily.   No current facility-administered medications for this visit. (Other)      REVIEW OF SYSTEMS: ROS    Positive for: Eyes   Negative for: Constitutional, Gastrointestinal, Neurological, Skin, Genitourinary, Musculoskeletal, HENT, Endocrine, Cardiovascular, Respiratory, Psychiatric, Allergic/Imm, Heme/Lymph   Last edited by Matthew Folks, COA on 05/29/2020  8:59 AM. (History)       ALLERGIES No Known Allergies  PAST MEDICAL HISTORY Past Medical History:  Diagnosis  Date  . Glaucoma    POAG OU  . Hypertensive retinopathy    OU   Past Surgical History:  Procedure Laterality Date  . CATARACT EXTRACTION Bilateral   . EYE SURGERY Bilateral    Cat Sx    FAMILY HISTORY History reviewed. No pertinent family history.  SOCIAL HISTORY         OPHTHALMIC EXAM:  Base Eye Exam    Visual Acuity (Snellen - Linear)      Right Left   Dist [place] 20/25 -2 20/30 -2   Dist ph [place]  20/25 -2       Tonometry (Tonopen, 9:03 AM)      Right Left   Pressure 11 12       Pupils      Dark Light Shape React APD   Right 4 3 Round Brisk None   Left 4 3 Round Brisk None        Visual Fields (Counting fingers)      Left Right    Full Full       Extraocular Movement      Right Left    Full, Ortho Full, Ortho       Neuro/Psych    Oriented x3: Yes   Mood/Affect: Normal       Dilation    Both eyes: 1.0% Mydriacyl, 2.5% Phenylephrine @ 9:02 AM        Slit Lamp and Fundus Exam    Slit Lamp Exam      Right Left   Lids/Lashes Dermatochalasis - upper lid Dermatochalasis - upper lid   Conjunctiva/Sclera mild Melanosis Mild melanosis, temporal pinguecula   Cornea arcus, 1-2+ Punctate epithelial erosions arcus, 1+ Punctate epithelial erosions, early band K, well healed temporal cataract wounds, mild central haze   Anterior Chamber deep and clear, narrow temporal angle deep and narrow temporal angle, 0.5+cell/pigment   Iris Round and moderately dilated  Round and moderately dilated    Lens PCIOL PC IOL in good position   Vitreous Vitreous syneresis Vitreous syneresis       Fundus Exam      Right Left   Disc 2+Pallor, +cupping, +PPA/PPP, thin superior rim 2+Pallor, +cupping, scattered PPP, thin inferior rim   C/D Ratio 0.6 0.7   Macula Flat, Blunted foveal reflex, RPE mottling, clumping and atrophy, Drusen, No heme or edema Blunted foveal reflex, focal pigment ring/CNV with stable improvement in shallow SRF IT to fovea, RPE mottling, clumping and atrophy, no heme   Vessels Vascular attenuation, mild tortuousity Vascular attenuation, mild tortuousity, focal perivascular pigment clumping temporally   Periphery Attached, No heme  Attached, focal perivascular pigment clumping temporally, No heme           IMAGING AND PROCEDURES  Imaging and Procedures for 05/29/2020  OCT, Retina - OU - Both Eyes       Right Eye Quality was good. Central Foveal Thickness: 229. Progression has been stable. Findings include normal foveal contour, no IRF, no SRF (Trace ERM; partial PVD).   Left Eye Quality was good. Central Foveal Thickness: 214. Progression has  worsened. Findings include subretinal hyper-reflective material, pigment epithelial detachment, normal foveal contour, intraretinal fluid, subretinal fluid (Stable focal PED, trace cystic changes overlying PED, trace sliver of SRF overlying PED, persistent ORA surrounding PED, focal subfoveal ellipsoid disruption).   Notes *Images captured and stored on drive  Diagnosis / Impression:  OD: NFP, no IRF/SRF OS: Stable focal PED, trace cystic changes overlying PED, trace sliver of SRF  overlying PED, persistent ORA surrounding PED, focal subfoveal ellipsoid disruption  Clinical management:  See below  Abbreviations: NFP - Normal foveal profile. CME - cystoid macular edema. PED - pigment epithelial detachment. IRF - intraretinal fluid. SRF - subretinal fluid. EZ - ellipsoid zone. ERM - epiretinal membrane. ORA - outer retinal atrophy. ORT - outer retinal tubulation. SRHM - subretinal hyper-reflective material. IRHM - intraretinal hyper-reflective material       Intravitreal Injection, Pharmacologic Agent - OS - Left Eye       Time Out 05/29/2020. 9:28 AM. Confirmed correct patient, procedure, site, and patient consented.   Anesthesia Topical anesthesia was used. Anesthetic medications included Lidocaine 2%, Proparacaine 0.5%.   Procedure Preparation included 5% betadine to ocular surface, eyelid speculum. A (32g) needle was used.   Injection:  1.25 mg Bevacizumab (AVASTIN) 1.25mg /0.47mL SOLN   NDC: 35465-681-27, Lot: [id number], Expiration date: 07/07/2020   Route: Intravitreal, Site: Left Eye, Waste: 0.05 mL  Post-op Post injection exam found visual acuity of at least counting fingers. The patient tolerated the procedure well. There were no complications. The patient received written and verbal post procedure care education. Post injection medications were not given.                 ASSESSMENT/PLAN:    ICD-10-CM   1. Choroidal neovascular membrane of left eye  H35.052  Intravitreal Injection, Pharmacologic Agent - OS - Left Eye    Bevacizumab (AVASTIN) SOLN 1.25 mg  2. Exudative age-related macular degeneration of left eye with active choroidal neovascularization (HCC)  H35.3221 Intravitreal Injection, Pharmacologic Agent - OS - Left Eye    Bevacizumab (AVASTIN) SOLN 1.25 mg  3. Retinal edema  H35.81 OCT, Retina - OU - Both Eyes  4. Essential hypertension  I10   5. Hypertensive retinopathy of both eyes  H35.033   6. Pseudophakia, both eyes  Z96.1   7. Primary open angle glaucoma (POAG) of both eyes, severe stage  H40.1133     1-3. Choroidal neovascular membrane w/ +SRF  - idiopathic CNV vs Exudative age related macular degeneration, OS  - interestingly, OD without drusen and OS with minimal drusen, which we would see in typical ARMD   - s/p IVA OS #1 (08.04.21), #2 (09.02.21), #3 (10.05.21), #4 (11.11.21)  - BCVA 20/25 -- improved from 20/30 OS at extended 6 wk interval  - OCT shows Stable focal PED, trace cystic changes overlying PED, trace sliver of SRF overlying PED, persistent ORA surrounding PED, focal subfoveal ellipsoid disruption  - recommend IVA OS #5 today, 12.23.21 - with follow up in 6 weeks - pt wishes to be treated with IVA OS - RBA of procedure discussed, questions answered - informed consent obtained, signed and scanned, 08.04.21 - see procedure note  - f/u in 6 wks -- DFE/OCT, possible injection -- tx and extend as able  4,5. Hypertensive retinopathy OU - discussed importance of tight BP control - monitor  6. Pseudophakia OU  - s/p CE/IOL ([place], OD 8.11.21; OS: 09.22.21)  - IOLs in good position, doing well  7. POAG OU  - under the expert management of Dr. Kathlen Mody  - IOP 11,12 today  - on dorzolamide BID and latanoprost QHS per Dr. Kathlen Mody   Ophthalmic Meds Ordered this visit:  Meds ordered this encounter  Medications  . Bevacizumab (AVASTIN) SOLN 1.25 mg       Return in about 6 weeks (around 07/10/2020) for f/u CNV OS,  DFE, OCT.  There are no Patient Instructions  on file for this visit.   Explained the diagnoses, plan, and follow up with the patient and they expressed understanding.  Patient expressed understanding of the importance of proper follow up care.   This document serves as a record of services personally performed by Gardiner Sleeper, MD, PhD. It was created on their behalf by Leonie Douglas, an ophthalmic technician. The creation of this record is the provider's dictation and/or activities during the visit.    Electronically signed by: Leonie Douglas COA, 05/30/20  1:56 AM   This document serves as a record of services personally performed by Gardiner Sleeper, MD, PhD. It was created on their behalf by San Jetty. Owens Shark, OA an ophthalmic technician. The creation of this record is the provider's dictation and/or activities during the visit.    Electronically signed by: San Jetty. Owens Shark, [place] 12.23.2021 1:56 AM   Gardiner Sleeper, M.D., Ph.D. Diseases & Surgery of the Retina and Vitreous [facility]  I have reviewed the above documentation for accuracy and completeness, and I agree with the above. Gardiner Sleeper, M.D., Ph.D. 05/30/20 1:56 AM   Abbreviations: M myopia (nearsighted); A astigmatism; H hyperopia (farsighted); P presbyopia; Mrx spectacle prescription;  CTL contact lenses; OD right eye; OS left eye; OU both eyes  XT exotropia; ET esotropia; PEK punctate epithelial keratitis; PEE punctate epithelial erosions; DES dry eye syndrome; MGD meibomian gland dysfunction; ATs artificial tears; PFAT's preservative free artificial tears; [place] nuclear sclerotic cataract; PSC posterior subcapsular cataract; ERM epi-retinal membrane; PVD posterior vitreous detachment; RD retinal detachment; DM diabetes mellitus; DR diabetic retinopathy; NPDR non-proliferative diabetic retinopathy; PDR proliferative diabetic retinopathy; CSME clinically significant macular edema; DME diabetic macular edema;  dbh dot blot hemorrhages; CWS cotton wool spot; POAG primary open angle glaucoma; C/D cup-to-disc ratio; HVF humphrey visual field; GVF goldmann visual field; OCT optical coherence tomography; IOP intraocular pressure; BRVO Branch retinal vein occlusion; CRVO central retinal vein occlusion; CRAO central retinal artery occlusion; BRAO branch retinal artery occlusion; RT retinal tear; SB scleral buckle; PPV pars plana vitrectomy; VH Vitreous hemorrhage; PRP panretinal laser photocoagulation; IVK intravitreal kenalog; VMT vitreomacular traction; MH Macular hole;  NVD neovascularization of the disc; NVE neovascularization elsewhere; AREDS age related eye disease study; ARMD age related macular degeneration; POAG primary open angle glaucoma; EBMD epithelial/anterior basement membrane dystrophy; ACIOL anterior chamber intraocular lens; IOL intraocular lens; PCIOL posterior chamber intraocular lens; Phaco/IOL phacoemulsification with intraocular lens placement; [place] photorefractive keratectomy; LASIK laser assisted in situ keratomileusis; HTN hypertension; DM diabetes mellitus; COPD chronic obstructive pulmonary disease

## 2020-05-29 ENCOUNTER — Ambulatory Visit (INDEPENDENT_AMBULATORY_CARE_PROVIDER_SITE_OTHER): Payer: Medicare Other | Admitting: Ophthalmology

## 2020-05-29 ENCOUNTER — Encounter (INDEPENDENT_AMBULATORY_CARE_PROVIDER_SITE_OTHER): Payer: Self-pay | Admitting: Ophthalmology

## 2020-05-29 ENCOUNTER — Other Ambulatory Visit: Payer: Self-pay

## 2020-05-29 DIAGNOSIS — I10 Essential (primary) hypertension: Secondary | ICD-10-CM | POA: Diagnosis not present

## 2020-05-29 DIAGNOSIS — H35052 Retinal neovascularization, unspecified, left eye: Secondary | ICD-10-CM | POA: Diagnosis not present

## 2020-05-29 DIAGNOSIS — H35033 Hypertensive retinopathy, bilateral: Secondary | ICD-10-CM | POA: Diagnosis not present

## 2020-05-29 DIAGNOSIS — H3581 Retinal edema: Secondary | ICD-10-CM

## 2020-05-29 DIAGNOSIS — H353221 Exudative age-related macular degeneration, left eye, with active choroidal neovascularization: Secondary | ICD-10-CM | POA: Diagnosis not present

## 2020-05-29 DIAGNOSIS — H401133 Primary open-angle glaucoma, bilateral, severe stage: Secondary | ICD-10-CM

## 2020-05-29 DIAGNOSIS — Z961 Presence of intraocular lens: Secondary | ICD-10-CM

## 2020-05-30 ENCOUNTER — Encounter (INDEPENDENT_AMBULATORY_CARE_PROVIDER_SITE_OTHER): Payer: Self-pay | Admitting: Ophthalmology

## 2020-05-30 DIAGNOSIS — H35052 Retinal neovascularization, unspecified, left eye: Secondary | ICD-10-CM

## 2020-05-30 DIAGNOSIS — H35033 Hypertensive retinopathy, bilateral: Secondary | ICD-10-CM | POA: Diagnosis not present

## 2020-05-30 DIAGNOSIS — H353221 Exudative age-related macular degeneration, left eye, with active choroidal neovascularization: Secondary | ICD-10-CM | POA: Diagnosis not present

## 2020-05-30 MED ORDER — BEVACIZUMAB CHEMO INJECTION 1.25MG/0.05ML SYRINGE FOR KALEIDOSCOPE
1.2500 mg | INTRAVITREAL | Status: AC | PRN
  Administered 2020-05-30: 02:00:00 1.25 mg via INTRAVITREAL

## 2020-06-10 DIAGNOSIS — E782 Mixed hyperlipidemia: Secondary | ICD-10-CM | POA: Diagnosis not present

## 2020-06-10 DIAGNOSIS — I10 Essential (primary) hypertension: Secondary | ICD-10-CM | POA: Diagnosis not present

## 2020-06-27 DIAGNOSIS — Z20822 Contact with and (suspected) exposure to covid-19: Secondary | ICD-10-CM | POA: Diagnosis not present

## 2020-07-09 NOTE — Progress Notes (Signed)
[facility] Note  07/10/2020     CHIEF COMPLAINT Patient presents for Retina Follow Up   HISTORY OF PRESENT ILLNESS: Danny Rhodes is a 87 y.o. male who presents to the clinic today for:  HPI    Retina Follow Up    Patient presents with  Other.  In left eye.  This started weeks ago.  Severity is moderate.  Duration of weeks.  Since onset it is stable.  I, the attending physician,  performed the HPI with the patient and updated documentation appropriately.          Comments    Pt states vision is the same OU.  Pt denies eye pain or discomfort and denies any new or worsening floaters or fol OU.       Last edited by Bernarda Caffey, MD on 07/10/2020 10:36 PM. (History)    Pt states   Referring physician: Marshall Cork, MD [address] [place] [address]  HISTORICAL INFORMATION:   Selected notes from the MEDICAL RECORD NUMBER Referred by Dr. Kathlen Mody LEE: 07.30.21 BCVA OD 20/40, OS 20/50 Ocular Hx- POAG OU severe, Cataracts OU, HTN Retinopathy OU, NVAMD OS, Band Keratopathy OU    CURRENT MEDICATIONS: Current Outpatient Medications (Ophthalmic Drugs)  Medication Sig  . dorzolamide (TRUSOPT) 2 % ophthalmic solution 1 drop into affected eye  . latanoprost (XALATAN) 0.005 % ophthalmic solution 1 drop into affected eye in the evening   No current facility-administered medications for this visit. (Ophthalmic Drugs)   Current Outpatient Medications (Other)  Medication Sig  . acetaminophen (TYLENOL) 325 MG tablet Take 2 tablets (650 mg total) by mouth every 6 (six) hours as needed for up to 30 doses.  Marland Kitchen acetaminophen (TYLENOL) 500 MG tablet Take 1,000 mg by mouth 2 (two) times daily.  Marland Kitchen amLODipine (NORVASC) 10 MG tablet Take 10 mg by mouth daily.  Marland Kitchen amLODipine (NORVASC) 5 MG tablet Take 5 mg by mouth daily.  Marland Kitchen aspirin EC 81 MG tablet Take 81 mg by mouth daily.  Marland Kitchen atorvastatin (LIPITOR) 20 MG tablet Take 20 mg by mouth daily at 6 PM.   . furosemide (LASIX) 20 MG tablet Take 20 mg by mouth daily.  Marland Kitchen ibuprofen (ADVIL) 400 MG tablet Take 1 tablet (400 mg total) by mouth every 6 (six) hours as needed for up to 10 doses.  . metoprolol tartrate (LOPRESSOR) 50 MG tablet Take 50 mg by mouth 2 (two) times daily.  Marland Kitchen olmesartan-hydrochlorothiazide (BENICAR HCT) 40-25 MG tablet Take 1 tablet by mouth daily.  . potassium chloride SA (KLOR-CON) 20 MEQ tablet potassium chloride ER 20 mEq tablet,extended release(part/cryst)  . traMADol (ULTRAM) 50 MG tablet Take 1 tablet (50 mg total) by mouth at bedtime as needed for up to 6 doses for severe pain.  . valsartan-hydrochlorothiazide (DIOVAN-HCT) 320-25 MG tablet Take 1 tablet by mouth daily.  . vitamin C (ASCORBIC ACID) 500 MG tablet Take 500 mg by mouth daily.   No current facility-administered medications for this visit. (Other)      REVIEW OF SYSTEMS: ROS    Positive for: Eyes   Negative for: Constitutional, Gastrointestinal, Neurological, Skin, Genitourinary, Musculoskeletal, HENT, Endocrine, Cardiovascular, Respiratory, Psychiatric, Allergic/Imm, Heme/Lymph   Last edited by Doneen Poisson on 07/10/2020  9:20 AM. (History)       ALLERGIES No Known Allergies  PAST MEDICAL HISTORY Past Medical History:  Diagnosis Date  . Glaucoma    POAG OU  . Hypertensive retinopathy  OU   Past Surgical History:  Procedure Laterality Date  . CATARACT EXTRACTION Bilateral   . EYE SURGERY Bilateral    Cat Sx    FAMILY HISTORY History reviewed. No pertinent family history.  SOCIAL HISTORY         OPHTHALMIC EXAM:  Base Eye Exam    Visual Acuity (Snellen - Linear)      Right Left   Dist [place] 20/50 -2 20/30 -2   Dist ph [place] 20/30 -2 20/25 -2       Tonometry (Tonopen, 9:26 AM)      Right Left   Pressure 14 12       Pupils      Dark Light Shape React APD   Right 2 1 Round Minimal 0   Left 2 1 Round Minimal 0       Visual Fields      Left Right    Full Full        Extraocular Movement      Right Left    Full Full       Neuro/Psych    Oriented x3: Yes   Mood/Affect: Normal       Dilation    Both eyes: 1.0% Mydriacyl, 2.5% Phenylephrine @ 9:26 AM        Slit Lamp and Fundus Exam    Slit Lamp Exam      Right Left   Lids/Lashes Dermatochalasis - upper lid Dermatochalasis - upper lid   Conjunctiva/Sclera mild Melanosis Mild melanosis, temporal pinguecula   Cornea arcus, 1-2+ Punctate epithelial erosions arcus, 1+ Punctate epithelial erosions, early band K, well healed temporal cataract wounds, mild central haze   Anterior Chamber deep and clear, narrow temporal angle deep and narrow temporal angle, 0.5+cell/pigment   Iris Round and moderately dilated  Round and moderately dilated    Lens PCIOL PC IOL in good position   Vitreous Vitreous syneresis Vitreous syneresis       Fundus Exam      Right Left   Disc 2+Pallor, +cupping, +PPA/PPP, thin superior rim 2+Pallor, +cupping, scattered PPP, thin inferior rim   C/D Ratio 0.6 0.7   Macula Flat, Blunted foveal reflex, RPE mottling, clumping and atrophy, Drusen, No heme or edema Blunted foveal reflex, focal pigment ring/CNV with stable improvement in shallow SRF IT to fovea, RPE mottling, clumping and atrophy, no heme   Vessels Vascular attenuation, mild tortuousity Vascular attenuation, mild tortuousity, focal perivascular pigment clumping temporally   Periphery Attached, No heme  Attached, focal perivascular pigment clumping temporally, No heme         Refraction    Wearing Rx      Sphere   Right None   Left None          IMAGING AND PROCEDURES  Imaging and Procedures for 07/10/2020  OCT, Retina - OU - Both Eyes       Right Eye Quality was good. Central Foveal Thickness: 221. Progression has been stable. Findings include normal foveal contour, no IRF, no SRF (Trace ERM; partial PVD).   Left Eye Quality was good. Central Foveal Thickness: 207. Progression has improved. Findings  include subretinal hyper-reflective material, pigment epithelial detachment, normal foveal contour, no IRF, no SRF (Stable focal PED, interval improvement in IRF/SRF overlying PED).   Notes *Images captured and stored on drive  Diagnosis / Impression:  OD: NFP, no IRF/SRF OS: Stable focal PED, interval improvement in IRF/SRF overlying PED  Clinical management:  See below  Abbreviations:  NFP - Normal foveal profile. CME - cystoid macular edema. PED - pigment epithelial detachment. IRF - intraretinal fluid. SRF - subretinal fluid. EZ - ellipsoid zone. ERM - epiretinal membrane. ORA - outer retinal atrophy. ORT - outer retinal tubulation. SRHM - subretinal hyper-reflective material. IRHM - intraretinal hyper-reflective material       Intravitreal Injection, Pharmacologic Agent - OS - Left Eye       Time Out 07/10/2020. 9:58 AM. Confirmed correct patient, procedure, site, and patient consented.   Anesthesia Topical anesthesia was used. Anesthetic medications included Lidocaine 2%, Proparacaine 0.5%.   Procedure Preparation included 5% betadine to ocular surface, eyelid speculum. A (32g) needle was used.   Injection:  1.25 mg Bevacizumab (AVASTIN) 1.'25mg'$ /0.81m SOLN   NDC: [id number] Lot: [id number]'$ , Expiration date: 08/13/2020   Route: Intravitreal, Site: Left Eye, Waste: 0 mL  Post-op Post injection exam found visual acuity of at least counting fingers. The patient tolerated the procedure well. There were no complications. The patient received written and verbal post procedure care education. Post injection medications were not given.                 ASSESSMENT/PLAN:    ICD-10-CM   1. Choroidal neovascular membrane of left eye  H35.052 Intravitreal Injection, Pharmacologic Agent - OS - Left Eye    Bevacizumab (AVASTIN) SOLN 1.25 mg  2. Exudative age-related macular degeneration of left eye with active choroidal neovascularization (HCC)  H35.3221 Intravitreal Injection,  Pharmacologic Agent - OS - Left Eye    Bevacizumab (AVASTIN) SOLN 1.25 mg  3. Retinal edema  H35.81 OCT, Retina - OU - Both Eyes  4. Essential hypertension  I10   5. Hypertensive retinopathy of both eyes  H35.033   6. Pseudophakia, both eyes  Z96.1   7. Primary open angle glaucoma (POAG) of both eyes, severe stage  H40.1133     1-3. Choroidal neovascular membrane w/ +SRF  - idiopathic CNV vs Exudative age related macular degeneration, OS  - interestingly, OD without drusen and OS with minimal drusen, which we would see in typical ARMD   - s/p IVA OS #1 (08.04.21), #2 (09.02.21), #3 (10.05.21), #4 (11.11.21), #5 (12.23.21)  - BCVA 20/25 -- stable at 6 week interval  - OCT shows Stable focal PED, interval improvement in IRF/SRF  - recommend IVA OS #6 today, 02.03.22 - with follow up extension to 8 weeks - pt wishes to be treated with IVA OS - RBA of procedure discussed, questions answered - informed consent obtained, signed and scanned, 08.04.21 - see procedure note  - f/u in 8 wks -- DFE/OCT, possible injection -- tx and extend as able  4,5. Hypertensive retinopathy OU - discussed importance of tight BP control - monitor  6. Pseudophakia OU  - s/p CE/IOL ([place], OD 8.11.21; OS: 09.22.21)  - IOLs in good position, doing well  7. POAG OU  - under the expert management of Dr. W10.05.21 - IOP 14,12 today  - on dorzolamide BID and latanoprost QHS per Dr. WKathlen Mody  Ophthalmic Meds Ordered this visit:  Meds ordered this encounter  Medications  . Bevacizumab (AVASTIN) SOLN 1.25 mg       Return in about 8 weeks (around 09/04/2020) for f/u CNVM OS, DFE, OCT.  There are no Patient Instructions on file for this visit.   Explained the diagnoses, plan, and follow up with the patient and they expressed understanding.  Patient expressed understanding of the importance of proper follow up care.  This document serves as a record of services personally performed by Gardiner Sleeper, MD,  PhD. It was created on their behalf by Leonie Douglas, an ophthalmic technician. The creation of this record is the provider's dictation and/or activities during the visit.    Electronically signed by: Leonie Douglas COA, 07/10/20  10:42 PM   This document serves as a record of services personally performed by Gardiner Sleeper, MD, PhD. It was created on their behalf by San Jetty. Owens Shark, OA an ophthalmic technician. The creation of this record is the provider's dictation and/or activities during the visit.    Electronically signed by: San Jetty. Owens Shark, [place] 02.03.2022 10:42 PM  Gardiner Sleeper, M.D., Ph.D. Diseases & Surgery of the Retina and Vitreous [facility]  I have reviewed the above documentation for accuracy and completeness, and I agree with the above. Gardiner Sleeper, M.D., Ph.D. 07/10/20 10:42 PM   Abbreviations: M myopia (nearsighted); A astigmatism; H hyperopia (farsighted); P presbyopia; Mrx spectacle prescription;  CTL contact lenses; OD right eye; OS left eye; OU both eyes  XT exotropia; ET esotropia; PEK punctate epithelial keratitis; PEE punctate epithelial erosions; DES dry eye syndrome; MGD meibomian gland dysfunction; ATs artificial tears; PFAT's preservative free artificial tears; [place] nuclear sclerotic cataract; PSC posterior subcapsular cataract; ERM epi-retinal membrane; PVD posterior vitreous detachment; RD retinal detachment; DM diabetes mellitus; DR diabetic retinopathy; NPDR non-proliferative diabetic retinopathy; PDR proliferative diabetic retinopathy; CSME clinically significant macular edema; DME diabetic macular edema; dbh dot blot hemorrhages; CWS cotton wool spot; POAG primary open angle glaucoma; C/D cup-to-disc ratio; HVF humphrey visual field; GVF goldmann visual field; OCT optical coherence tomography; IOP intraocular pressure; BRVO Branch retinal vein occlusion; CRVO central retinal vein occlusion; CRAO central retinal artery occlusion; BRAO branch  retinal artery occlusion; RT retinal tear; SB scleral buckle; PPV pars plana vitrectomy; VH Vitreous hemorrhage; PRP panretinal laser photocoagulation; IVK intravitreal kenalog; VMT vitreomacular traction; MH Macular hole;  NVD neovascularization of the disc; NVE neovascularization elsewhere; AREDS age related eye disease study; ARMD age related macular degeneration; POAG primary open angle glaucoma; EBMD epithelial/anterior basement membrane dystrophy; ACIOL anterior chamber intraocular lens; IOL intraocular lens; PCIOL posterior chamber intraocular lens; Phaco/IOL phacoemulsification with intraocular lens placement; [place] photorefractive keratectomy; LASIK laser assisted in situ keratomileusis; HTN hypertension; DM diabetes mellitus; COPD chronic obstructive pulmonary disease

## 2020-07-10 ENCOUNTER — Encounter (INDEPENDENT_AMBULATORY_CARE_PROVIDER_SITE_OTHER): Payer: Self-pay | Admitting: Ophthalmology

## 2020-07-10 ENCOUNTER — Other Ambulatory Visit: Payer: Self-pay

## 2020-07-10 ENCOUNTER — Encounter (INDEPENDENT_AMBULATORY_CARE_PROVIDER_SITE_OTHER): Payer: Medicare Other | Admitting: Ophthalmology

## 2020-07-10 ENCOUNTER — Ambulatory Visit (INDEPENDENT_AMBULATORY_CARE_PROVIDER_SITE_OTHER): Payer: Medicare Other | Admitting: Ophthalmology

## 2020-07-10 DIAGNOSIS — Z961 Presence of intraocular lens: Secondary | ICD-10-CM | POA: Diagnosis not present

## 2020-07-10 DIAGNOSIS — H35033 Hypertensive retinopathy, bilateral: Secondary | ICD-10-CM | POA: Diagnosis not present

## 2020-07-10 DIAGNOSIS — H3581 Retinal edema: Secondary | ICD-10-CM

## 2020-07-10 DIAGNOSIS — H353221 Exudative age-related macular degeneration, left eye, with active choroidal neovascularization: Secondary | ICD-10-CM | POA: Diagnosis not present

## 2020-07-10 DIAGNOSIS — H35052 Retinal neovascularization, unspecified, left eye: Secondary | ICD-10-CM | POA: Diagnosis not present

## 2020-07-10 DIAGNOSIS — H401133 Primary open-angle glaucoma, bilateral, severe stage: Secondary | ICD-10-CM

## 2020-07-10 DIAGNOSIS — I10 Essential (primary) hypertension: Secondary | ICD-10-CM

## 2020-07-10 MED ORDER — BEVACIZUMAB CHEMO INJECTION 1.25MG/0.05ML SYRINGE FOR KALEIDOSCOPE
1.2500 mg | INTRAVITREAL | Status: AC | PRN
  Administered 2020-07-10: 1.25 mg via INTRAVITREAL

## 2020-07-11 ENCOUNTER — Encounter (INDEPENDENT_AMBULATORY_CARE_PROVIDER_SITE_OTHER): Payer: Medicare Other | Admitting: Ophthalmology

## 2020-07-24 DIAGNOSIS — H401133 Primary open-angle glaucoma, bilateral, severe stage: Secondary | ICD-10-CM | POA: Diagnosis not present

## 2020-08-01 DIAGNOSIS — E785 Hyperlipidemia, unspecified: Secondary | ICD-10-CM | POA: Diagnosis not present

## 2020-08-01 DIAGNOSIS — I10 Essential (primary) hypertension: Secondary | ICD-10-CM | POA: Diagnosis not present

## 2020-08-01 DIAGNOSIS — N189 Chronic kidney disease, unspecified: Secondary | ICD-10-CM | POA: Diagnosis not present

## 2020-09-02 NOTE — Progress Notes (Signed)
[facility] Note  09/04/2020     CHIEF COMPLAINT Patient presents for Retina Follow Up   HISTORY OF PRESENT ILLNESS: Danny Rhodes is a 87 y.o. male who presents to the clinic today for:  HPI    Retina Follow Up    Patient presents with  Other.  In left eye.  This started 8 weeks ago.  I, the attending physician,  performed the HPI with the patient and updated documentation appropriately.          Comments    Patient here for 8 weeks retina follow up for CNVM OS. Patient states vision about the same. No eye pain.        Last edited by Bernarda Caffey, MD on 09/04/2020 12:31 PM. (History)      Referring physician: Marshall Cork, MD [place] [address]  HISTORICAL INFORMATION:   Selected notes from the MEDICAL RECORD NUMBER Referred by Dr. Kathlen Mody LEE: 07.30.21 BCVA OD 20/40, OS 20/50 Ocular Hx- POAG OU severe, Cataracts OU, HTN Retinopathy OU, NVAMD OS, Band Keratopathy OU   CURRENT MEDICATIONS: Current Outpatient Medications (Ophthalmic Drugs)  Medication Sig  . dorzolamide (TRUSOPT) 2 % ophthalmic solution 1 drop into affected eye  . latanoprost (XALATAN) 0.005 % ophthalmic solution 1 drop into affected eye in the evening   No current facility-administered medications for this visit. (Ophthalmic Drugs)   Current Outpatient Medications (Other)  Medication Sig  . acetaminophen (TYLENOL) 325 MG tablet Take 2 tablets (650 mg total) by mouth every 6 (six) hours as needed for up to 30 doses.  Marland Kitchen acetaminophen (TYLENOL) 500 MG tablet Take 1,000 mg by mouth 2 (two) times daily.  Marland Kitchen amLODipine (NORVASC) 10 MG tablet Take 10 mg by mouth daily.  Marland Kitchen amLODipine (NORVASC) 5 MG tablet Take 5 mg by mouth daily.  Marland Kitchen aspirin EC 81 MG tablet Take 81 mg by mouth daily.  Marland Kitchen atorvastatin (LIPITOR) 20 MG tablet Take 20 mg by mouth daily at 6 PM.  . furosemide (LASIX) 20 MG tablet Take 20 mg by mouth daily.  Marland Kitchen ibuprofen (ADVIL) 400 MG  tablet Take 1 tablet (400 mg total) by mouth every 6 (six) hours as needed for up to 10 doses.  . metoprolol tartrate (LOPRESSOR) 50 MG tablet Take 50 mg by mouth 2 (two) times daily.  Marland Kitchen olmesartan-hydrochlorothiazide (BENICAR HCT) 40-25 MG tablet Take 1 tablet by mouth daily.  . potassium chloride SA (KLOR-CON) 20 MEQ tablet potassium chloride ER 20 mEq tablet,extended release(part/cryst)  . traMADol (ULTRAM) 50 MG tablet Take 1 tablet (50 mg total) by mouth at bedtime as needed for up to 6 doses for severe pain.  . valsartan-hydrochlorothiazide (DIOVAN-HCT) 320-25 MG tablet Take 1 tablet by mouth daily.  . vitamin C (ASCORBIC ACID) 500 MG tablet Take 500 mg by mouth daily.   No current facility-administered medications for this visit. (Other)      REVIEW OF SYSTEMS: ROS    Positive for: Eyes   Negative for: Constitutional, Gastrointestinal, Neurological, Skin, Genitourinary, Musculoskeletal, HENT, Endocrine, Cardiovascular, Respiratory, Psychiatric, Allergic/Imm, Heme/Lymph   Last edited by Theodore Demark, COA on 09/04/2020  9:36 AM. (History)       ALLERGIES No Known Allergies  PAST MEDICAL HISTORY Past Medical History:  Diagnosis Date  . Glaucoma    POAG OU  . Hypertensive retinopathy    OU   Past Surgical History:  Procedure Laterality Date  . CATARACT EXTRACTION Bilateral   .  EYE SURGERY Bilateral    Cat Sx    FAMILY HISTORY History reviewed. No pertinent family history.  SOCIAL HISTORY         OPHTHALMIC EXAM:  Base Eye Exam    Visual Acuity (Snellen - Linear)      Right Left   Dist [place] 20/40 -1 20/40 -2   Dist ph [place] 20/25 -1 20/30       Tonometry (Tonopen, 9:34 AM)      Right Left   Pressure 13 12       Pupils      Dark Light Shape React APD   Right 2 1 Round Minimal None   Left 2 1 Round Minimal None       Visual Fields (Counting fingers)      Left Right    Full Full       Extraocular Movement      Right Left    Full Full        Neuro/Psych    Oriented x3: Yes   Mood/Affect: Normal       Dilation    Both eyes: 1.0% Mydriacyl, 2.5% Phenylephrine @ 9:34 AM        Slit Lamp and Fundus Exam    Slit Lamp Exam      Right Left   Lids/Lashes Dermatochalasis - upper lid Dermatochalasis - upper lid   Conjunctiva/Sclera mild Melanosis Mild melanosis, temporal pinguecula   Cornea arcus, 1-2+ Punctate epithelial erosions arcus, 1+ Punctate epithelial erosions, early band K, well healed temporal cataract wounds, mild central haze   Anterior Chamber deep and clear, narrow temporal angle deep and narrow temporal angle, 0.5+cell/pigment   Iris Round and moderately dilated  Round and moderately dilated    Lens PCIOL PC IOL in good position   Vitreous Vitreous syneresis Vitreous syneresis       Fundus Exam      Right Left   Disc 2+Pallor, +cupping, +PPA/PPP, thin superior rim 2+Pallor, +cupping, scattered PPP, thin inferior rim   C/D Ratio 0.6 0.7   Macula Flat, Blunted foveal reflex, RPE mottling, clumping and atrophy, Drusen, No heme or edema Blunted foveal reflex, focal pigment ring/CNV with stable improvement in shallow SRF IT to fovea, RPE mottling, clumping and atrophy, no heme   Vessels Vascular attenuation, mild tortuousity Vascular attenuation, mild tortuousity, focal perivascular pigment clumping temporally   Periphery Attached, No heme  Attached, focal perivascular pigment clumping temporally, No heme         Refraction    Wearing Rx      Sphere   Right None   Left None          IMAGING AND PROCEDURES  Imaging and Procedures for 09/04/2020  OCT, Retina - OU - Both Eyes       Right Eye Quality was good. Central Foveal Thickness: 223. Progression has been stable. Findings include normal foveal contour, no IRF, no SRF (Trace ERM; partial PVD).   Left Eye Quality was good. Central Foveal Thickness: 207. Progression has been stable. Findings include subretinal hyper-reflective material, pigment  epithelial detachment, normal foveal contour, no IRF, no SRF (Stable focal PED, stable improvement in IRF/SRF overlying PED).   Notes *Images captured and stored on drive  Diagnosis / Impression:  OD: NFP, no IRF/SRF OS: Stable focal PED, stable improvement in IRF/SRF overlying PED  Clinical management:  See below  Abbreviations: NFP - Normal foveal profile. CME - cystoid macular edema. PED - pigment epithelial detachment. IRF -  intraretinal fluid. SRF - subretinal fluid. EZ - ellipsoid zone. ERM - epiretinal membrane. ORA - outer retinal atrophy. ORT - outer retinal tubulation. SRHM - subretinal hyper-reflective material. IRHM - intraretinal hyper-reflective material       Intravitreal Injection, Pharmacologic Agent - OS - Left Eye       Time Out 09/04/2020. 9:50 AM. Confirmed correct patient, procedure, site, and patient consented.   Anesthesia Topical anesthesia was used. Anesthetic medications included Lidocaine 2%, Proparacaine 0.5%.   Procedure Preparation included 5% betadine to ocular surface, eyelid speculum. A (32g) needle was used.   Injection:  1.25 mg Bevacizumab (AVASTIN) 1.'25mg'$ /0.80m SOLN   NDC: [id number] Expiration date: 10/14/2020   Route: Intravitreal, Site: Left Eye, Waste: 0.05 mL  Post-op Post injection exam found visual acuity of at least counting fingers. The patient tolerated the procedure well. There were no complications. The patient received written and verbal post procedure care education. Post injection medications were not given.                 ASSESSMENT/PLAN:    ICD-10-CM   1. Choroidal neovascular membrane of left eye  H35.052   2. Exudative age-related macular degeneration of left eye with active choroidal neovascularization (HCC)  H35.3221 Intravitreal Injection, Pharmacologic Agent - OS - Left Eye    Bevacizumab (AVASTIN) SOLN 1.25 mg  3. Retinal edema  H35.81 OCT, Retina - OU - Both Eyes  4. Essential  hypertension  I10   5. Hypertensive retinopathy of both eyes  H35.033   6. Pseudophakia, both eyes  Z96.1   7. Primary open angle glaucoma (POAG) of both eyes, severe stage  H40.1133     1-3. Choroidal neovascular membrane w/ +SRF  - idiopathic CNV vs Exudative age related macular degeneration, OS  - interestingly, OD without drusen and OS with minimal drusen, which we would see in typical ARMD   - s/p IVA OS #1 (08.04.21), #2 (09.02.21), #3 (10.05.21), #4 (11.11.21), #5 (12.23.21), #6 (2.3.22)  - BCVA OS 20/30  - OCT shows Stable focal PED, stable improvement in IRF/SRF at 8 wks  - recommend IVA OS #7 today, 3.31.22 -- maintenance w/ ext to 10 wks - pt wishes to be treated with IVA OS - RBA of procedure discussed, questions answered - informed consent obtained, signed and scanned, 08.04.21 - see procedure note  - f/u in 10 wks -- DFE/OCT, possible injection -- tx and extend as able  4,5. Hypertensive retinopathy OU - discussed importance of tight BP control - monitor  6. Pseudophakia OU  - s/p CE/IOL ([place], OD 8.11.21; OS: 09.22.21)  - IOLs in good position, doing well  7. POAG OU  - under the expert management of Dr. WKathlen Mody - IOP 13,12 today  - on dorzolamide BID and latanoprost QHS per Dr. WKathlen Mody  Ophthalmic Meds Ordered this visit:  Meds ordered this encounter  Medications  . Bevacizumab (AVASTIN) SOLN 1.25 mg       Return in about 10 weeks (around 11/13/2020) for f/u CNV OS, DFE, OCT.  There are no Patient Instructions on file for this visit.   Explained the diagnoses, plan, and follow up with the patient and they expressed understanding.  Patient expressed understanding of the importance of proper follow up care.   This document serves as a record of services personally performed by BGardiner Sleeper MD, PhD. It was created on their behalf by AEstill Bakes COT an ophthalmic technician. The creation of this record  is the provider's dictation and/or activities  during the visit.    Electronically signed by: Estill Bakes, COT 3.29.22 @ 12:38 PM   This document serves as a record of services personally performed by Gardiner Sleeper, MD, PhD. It was created on their behalf by San Jetty. Owens Shark, OA an ophthalmic technician. The creation of this record is the provider's dictation and/or activities during the visit.    Electronically signed by: San Jetty. Marguerita Merles 03.31.2022 12:38 PM  Gardiner Sleeper, M.D., Ph.D. Diseases & Surgery of the Retina and [place] 09/04/2020   I have reviewed the above documentation for accuracy and completeness, and I agree with the above. Gardiner Sleeper, M.D., Ph.D. 09/04/20 12:38 PM   Abbreviations: M myopia (nearsighted); A astigmatism; H hyperopia (farsighted); P presbyopia; Mrx spectacle prescription;  CTL contact lenses; OD right eye; OS left eye; OU both eyes  XT exotropia; ET esotropia; PEK punctate epithelial keratitis; PEE punctate epithelial erosions; DES dry eye syndrome; MGD meibomian gland dysfunction; ATs artificial tears; PFAT's preservative free artificial tears; [place] nuclear sclerotic cataract; PSC posterior subcapsular cataract; ERM epi-retinal membrane; PVD posterior vitreous detachment; RD retinal detachment; DM diabetes mellitus; DR diabetic retinopathy; NPDR non-proliferative diabetic retinopathy; PDR proliferative diabetic retinopathy; CSME clinically significant macular edema; DME diabetic macular edema; dbh dot blot hemorrhages; CWS cotton wool spot; POAG primary open angle glaucoma; C/D cup-to-disc ratio; HVF humphrey visual field; GVF goldmann visual field; OCT optical coherence tomography; IOP intraocular pressure; BRVO Branch retinal vein occlusion; CRVO central retinal vein occlusion; CRAO central retinal artery occlusion; BRAO branch retinal artery occlusion; RT retinal tear; SB scleral buckle; PPV pars plana vitrectomy; VH Vitreous hemorrhage; PRP panretinal laser  photocoagulation; IVK intravitreal kenalog; VMT vitreomacular traction; MH Macular hole;  NVD neovascularization of the disc; NVE neovascularization elsewhere; AREDS age related eye disease study; ARMD age related macular degeneration; POAG primary open angle glaucoma; EBMD epithelial/anterior basement membrane dystrophy; ACIOL anterior chamber intraocular lens; IOL intraocular lens; PCIOL posterior chamber intraocular lens; Phaco/IOL phacoemulsification with intraocular lens placement; [place] photorefractive keratectomy; LASIK laser assisted in situ keratomileusis; HTN hypertension; DM diabetes mellitus; COPD chronic obstructive pulmonary disease

## 2020-09-04 ENCOUNTER — Ambulatory Visit (INDEPENDENT_AMBULATORY_CARE_PROVIDER_SITE_OTHER): Payer: Medicare Other | Admitting: Ophthalmology

## 2020-09-04 ENCOUNTER — Encounter (INDEPENDENT_AMBULATORY_CARE_PROVIDER_SITE_OTHER): Payer: Self-pay | Admitting: Ophthalmology

## 2020-09-04 ENCOUNTER — Other Ambulatory Visit: Payer: Self-pay

## 2020-09-04 DIAGNOSIS — H353221 Exudative age-related macular degeneration, left eye, with active choroidal neovascularization: Secondary | ICD-10-CM

## 2020-09-04 DIAGNOSIS — I10 Essential (primary) hypertension: Secondary | ICD-10-CM

## 2020-09-04 DIAGNOSIS — H35052 Retinal neovascularization, unspecified, left eye: Secondary | ICD-10-CM

## 2020-09-04 DIAGNOSIS — Z961 Presence of intraocular lens: Secondary | ICD-10-CM

## 2020-09-04 DIAGNOSIS — H35033 Hypertensive retinopathy, bilateral: Secondary | ICD-10-CM

## 2020-09-04 DIAGNOSIS — H3581 Retinal edema: Secondary | ICD-10-CM | POA: Diagnosis not present

## 2020-09-04 DIAGNOSIS — H401133 Primary open-angle glaucoma, bilateral, severe stage: Secondary | ICD-10-CM | POA: Diagnosis not present

## 2020-09-04 MED ORDER — BEVACIZUMAB CHEMO INJECTION 1.25MG/0.05ML SYRINGE FOR KALEIDOSCOPE
1.2500 mg | INTRAVITREAL | Status: AC | PRN
  Administered 2020-09-04: 1.25 mg via INTRAVITREAL

## 2020-09-11 DIAGNOSIS — D631 Anemia in chronic kidney disease: Secondary | ICD-10-CM | POA: Diagnosis not present

## 2020-09-11 DIAGNOSIS — N189 Chronic kidney disease, unspecified: Secondary | ICD-10-CM | POA: Diagnosis not present

## 2020-09-11 DIAGNOSIS — N2581 Secondary hyperparathyroidism of renal origin: Secondary | ICD-10-CM | POA: Diagnosis not present

## 2020-09-11 DIAGNOSIS — E871 Hypo-osmolality and hyponatremia: Secondary | ICD-10-CM | POA: Diagnosis not present

## 2020-09-11 DIAGNOSIS — M1A0710 Idiopathic chronic gout, right ankle and foot, without tophus (tophi): Secondary | ICD-10-CM | POA: Diagnosis not present

## 2020-09-11 DIAGNOSIS — I129 Hypertensive chronic kidney disease with stage 1 through stage 4 chronic kidney disease, or unspecified chronic kidney disease: Secondary | ICD-10-CM | POA: Diagnosis not present

## 2020-09-11 DIAGNOSIS — N184 Chronic kidney disease, stage 4 (severe): Secondary | ICD-10-CM | POA: Diagnosis not present

## 2020-09-25 ENCOUNTER — Other Ambulatory Visit (HOSPITAL_COMMUNITY): Payer: Self-pay

## 2020-09-26 ENCOUNTER — Inpatient Hospital Stay (HOSPITAL_COMMUNITY): Admission: RE | Admit: 2020-09-26 | Payer: Medicare Other | Source: Ambulatory Visit

## 2020-10-03 ENCOUNTER — Encounter (HOSPITAL_COMMUNITY): Payer: Medicare Other

## 2020-11-12 NOTE — Progress Notes (Signed)
[facility] Note  11/13/2020     CHIEF COMPLAINT Patient presents for Retina Follow Up   HISTORY OF PRESENT ILLNESS: Danny Rhodes is a 87 y.o. male who presents to the clinic today for:  HPI     Retina Follow Up   Patient presents with  Wet AMD.  In left eye.  This started 10 weeks ago.  I, the attending physician,  performed the HPI with the patient and updated documentation appropriately.        Comments   Patient here for 10 weeks retina follow up exu ARMD OS. Patient states vision been good. No eye pain.       Last edited by Bernarda Caffey, MD on 11/13/2020 10:43 AM.      Referring physician: Marshall Cork, MD [place] [id number]  HISTORICAL INFORMATION:   Selected notes from the MEDICAL RECORD NUMBER Referred by Dr. Kathlen Mody LEE: 07.30.21 BCVA OD 20/40, OS 20/50 Ocular Hx- POAG OU severe, Cataracts OU, HTN Retinopathy OU, NVAMD OS, Band Keratopathy OU   CURRENT MEDICATIONS: Current Outpatient Medications (Ophthalmic Drugs)  Medication Sig   dorzolamide (TRUSOPT) 2 % ophthalmic solution 1 drop into affected eye   latanoprost (XALATAN) 0.005 % ophthalmic solution 1 drop into affected eye in the evening   No current facility-administered medications for this visit. (Ophthalmic Drugs)   Current Outpatient Medications (Other)  Medication Sig   acetaminophen (TYLENOL) 325 MG tablet Take 2 tablets (650 mg total) by mouth every 6 (six) hours as needed for up to 30 doses.   acetaminophen (TYLENOL) 500 MG tablet Take 1,000 mg by mouth 2 (two) times daily.   amLODipine (NORVASC) 10 MG tablet Take 10 mg by mouth daily.   amLODipine (NORVASC) 5 MG tablet Take 5 mg by mouth daily.   aspirin EC 81 MG tablet Take 81 mg by mouth daily.   atorvastatin (LIPITOR) 20 MG tablet Take 20 mg by mouth daily at 6 PM.   furosemide (LASIX) 20 MG tablet Take 20 mg by mouth daily.   ibuprofen (ADVIL) 400 MG tablet Take 1 tablet  (400 mg total) by mouth every 6 (six) hours as needed for up to 10 doses.   metoprolol tartrate (LOPRESSOR) 50 MG tablet Take 50 mg by mouth 2 (two) times daily.   olmesartan-hydrochlorothiazide (BENICAR HCT) 40-25 MG tablet Take 1 tablet by mouth daily.   potassium chloride SA (KLOR-CON) 20 MEQ tablet potassium chloride ER 20 mEq tablet,extended release(part/cryst)   traMADol (ULTRAM) 50 MG tablet Take 1 tablet (50 mg total) by mouth at bedtime as needed for up to 6 doses for severe pain.   valsartan-hydrochlorothiazide (DIOVAN-HCT) 320-25 MG tablet Take 1 tablet by mouth daily.   vitamin C (ASCORBIC ACID) 500 MG tablet Take 500 mg by mouth daily.   No current facility-administered medications for this visit. (Other)      REVIEW OF SYSTEMS: ROS   Positive for: Eyes Negative for: Constitutional, Gastrointestinal, Neurological, Skin, Genitourinary, Musculoskeletal, HENT, Endocrine, Cardiovascular, Respiratory, Psychiatric, Allergic/Imm, Heme/Lymph Last edited by Theodore Demark, COA on 11/13/2020  9:06 AM.       ALLERGIES No Known Allergies  PAST MEDICAL HISTORY Past Medical History:  Diagnosis Date   Glaucoma    POAG OU   Hypertensive retinopathy    OU   Past Surgical History:  Procedure Laterality Date   CATARACT EXTRACTION Bilateral    EYE SURGERY Bilateral    Cat Sx  FAMILY HISTORY History reviewed. No pertinent family history.  SOCIAL HISTORY         OPHTHALMIC EXAM:  Base Eye Exam     Visual Acuity (Snellen - Linear)       Right Left   Dist [place] 20/30 20/30 -1   Dist ph [place] 20/25 -2 20/25 -2         Tonometry (Tonopen, 9:04 AM)       Right Left   Pressure 10 10         Pupils       Dark Light Shape React APD   Right 2 1 Round Minimal None   Left 2 1 Round Minimal None         Visual Fields (Counting fingers)       Left Right    Full Full         Extraocular Movement       Right Left    Full Full         Neuro/Psych      Oriented x3: Yes   Mood/Affect: Normal         Dilation     Both eyes: 1.0% Mydriacyl, 2.5% Phenylephrine @ 9:04 AM           Slit Lamp and Fundus Exam     Slit Lamp Exam       Right Left   Lids/Lashes Dermatochalasis - upper lid Dermatochalasis - upper lid   Conjunctiva/Sclera mild Melanosis Mild melanosis, temporal pinguecula   Cornea arcus, 1-2+ Punctate epithelial erosions arcus, 1+ Punctate epithelial erosions, early band K, well healed temporal cataract wounds, mild central haze   Anterior Chamber deep and clear, narrow temporal angle deep and narrow temporal angle, 0.5+cell/pigment   Iris Round and moderately dilated  Round and moderately dilated    Lens PCIOL PC IOL in good position   Vitreous Vitreous syneresis Vitreous syneresis         Fundus Exam       Right Left   Disc 2+Pallor, +cupping, +PPA/PPP, thin superior rim 2+Pallor, +cupping, scattered PPP, thin inferior rim   C/D Ratio 0.6 0.7   Macula Flat, Blunted foveal reflex, RPE mottling, clumping and atrophy, Drusen, No heme or edema Blunted foveal reflex, focal pigment ring/CNV with stable improvement in shallow SRF IT to fovea, RPE mottling, clumping and atrophy, no heme   Vessels Vascular attenuation, mild tortuousity Vascular attenuation, mild tortuousity, focal perivascular pigment clumping temporally   Periphery Attached, No heme  Attached, focal perivascular pigment clumping temporally, No heme            Refraction     Wearing Rx       Sphere   Right None   Left None            IMAGING AND PROCEDURES  Imaging and Procedures for 11/13/2020  OCT, Retina - OU - Both Eyes       Right Eye Quality was good. Central Foveal Thickness: 223. Progression has been stable. Findings include normal foveal contour, no IRF, no SRF, retinal drusen (Trace ERM; partial PVD).   Left Eye Quality was good. Central Foveal Thickness: 205. Progression has been stable. Findings include subretinal  hyper-reflective material, pigment epithelial detachment, normal foveal contour, no IRF, no SRF (Stable focal PED, stable improvement in IRF/SRF overlying PED).   Notes *Images captured and stored on drive  Diagnosis / Impression:  OD: NFP, no IRF/SRF OS: Stable focal PED, stable improvement in IRF/SRF  overlying PED  Clinical management:  See below  Abbreviations: NFP - Normal foveal profile. CME - cystoid macular edema. PED - pigment epithelial detachment. IRF - intraretinal fluid. SRF - subretinal fluid. EZ - ellipsoid zone. ERM - epiretinal membrane. ORA - outer retinal atrophy. ORT - outer retinal tubulation. SRHM - subretinal hyper-reflective material. IRHM - intraretinal hyper-reflective material     Intravitreal Injection, Pharmacologic Agent - OS - Left Eye       Time Out 11/13/2020. 9:48 AM. Confirmed correct patient, procedure, site, and patient consented.   Anesthesia Topical anesthesia was used. Anesthetic medications included Lidocaine 2%, Proparacaine 0.5%.   Procedure Preparation included 5% betadine to ocular surface, eyelid speculum. A supplied (32g) needle was used.   Injection: 1.25 mg Bevacizumab 1.'25mg'$ /0.83m   Route: Intravitreal, Site: Left Eye   NDC: [id number] Lot: [id number]'$ , Expiration date: 12/17/2020, Waste: 0 mL   Post-op Post injection exam found visual acuity of at least counting fingers. The patient tolerated the procedure well. There were no complications. The patient received written and verbal post procedure care education. Post injection medications were not given.              ASSESSMENT/PLAN:    ICD-10-CM   1. Choroidal neovascular membrane of left eye  H35.052     2. Exudative age-related macular degeneration of left eye with active choroidal neovascularization (HCC)  H35.3221 Intravitreal Injection, Pharmacologic Agent - OS - Left Eye    Bevacizumab (AVASTIN) SOLN 1.25 mg    3. Retinal edema  H35.81 OCT, Retina - OU - Both  Eyes    4. Essential hypertension  I10     5. Hypertensive retinopathy of both eyes  H35.033     6. Pseudophakia, both eyes  Z96.1     7. Primary open angle glaucoma (POAG) of both eyes, severe stage  H40.1133       1-3. Choroidal neovascular membrane w/ +SRF  - idiopathic CNV vs Exudative age related macular degeneration, OS  - interestingly, OD without drusen and OS with minimal drusen, which we would see in typical ARMD   - s/p IVA OS #1 (08.04.21), #2 (09.02.21), #3 (10.05.21), #4 (11.11.21), #5 (12.23.21), #6 (2.3.22), #7 (03.31.22)  - BCVA OS 20/25  - OCT shows Stable focal PED, stable improvement in IRF/SRF at 10 wks  - recommend IVA OS #8 today, 06.09.22 -- maintenance w/ ext to 12 wks - pt wishes to be treated with IVA OS - RBA of procedure discussed, questions answered - informed consent obtained, signed and scanned, 08.04.21 - see procedure note  - f/u in 12 wks -- DFE/OCT, possible injection -- tx and extend as able  4,5. Hypertensive retinopathy OU - discussed importance of tight BP control - monitor   6. Pseudophakia OU  - s/p CE/IOL (Weaver, OD 8.11.21; OS: 09.22.21)  - IOLs in good position, doing well  7. POAG OU  - under the expert management of Dr. W10.05.21 - IOP 10 OU today  - on dorzolamide BID and latanoprost QHS per Dr. WKathlen Mody  Ophthalmic Meds Ordered this visit:  Meds ordered this encounter  Medications   Bevacizumab (AVASTIN) SOLN 1.25 mg        Return in about 12 weeks (around 02/05/2021) for f/u exu ARMD OS, DFE, OCT.  There are no Patient Instructions on file for this visit.   Explained the diagnoses, plan, and follow up with the patient and they expressed understanding.  Patient expressed understanding of the importance  of proper follow up care.   This document serves as a record of services personally performed by Gardiner Sleeper, MD, PhD. It was created on their behalf by Leonie Douglas, an ophthalmic technician. The creation of this  record is the provider's dictation and/or activities during the visit.    Electronically signed by: Leonie Douglas COA, 11/13/20  11:04 AM  This document serves as a record of services personally performed by Gardiner Sleeper, MD, PhD. It was created on their behalf by San Jetty. Owens Shark, OA an ophthalmic technician. The creation of this record is the provider's dictation and/or activities during the visit.    Electronically signed by: San Jetty. Marguerita Merles 06.09.2022 11:04 AM   Gardiner Sleeper, M.D., Ph.D. Diseases & Surgery of the Retina and [place] 11/13/2020   I have reviewed the above documentation for accuracy and completeness, and I agree with the above. Gardiner Sleeper, M.D., Ph.D. 11/13/20 11:04 AM   Abbreviations: M myopia (nearsighted); A astigmatism; H hyperopia (farsighted); P presbyopia; Mrx spectacle prescription;  CTL contact lenses; OD right eye; OS left eye; OU both eyes  XT exotropia; ET esotropia; PEK punctate epithelial keratitis; PEE punctate epithelial erosions; DES dry eye syndrome; MGD meibomian gland dysfunction; ATs artificial tears; PFAT's preservative free artificial tears; [place] nuclear sclerotic cataract; PSC posterior subcapsular cataract; ERM epi-retinal membrane; PVD posterior vitreous detachment; RD retinal detachment; DM diabetes mellitus; DR diabetic retinopathy; NPDR non-proliferative diabetic retinopathy; PDR proliferative diabetic retinopathy; CSME clinically significant macular edema; DME diabetic macular edema; dbh dot blot hemorrhages; CWS cotton wool spot; POAG primary open angle glaucoma; C/D cup-to-disc ratio; HVF humphrey visual field; GVF goldmann visual field; OCT optical coherence tomography; IOP intraocular pressure; BRVO Branch retinal vein occlusion; CRVO central retinal vein occlusion; CRAO central retinal artery occlusion; BRAO branch retinal artery occlusion; RT retinal tear; SB scleral buckle; PPV pars plana  vitrectomy; VH Vitreous hemorrhage; PRP panretinal laser photocoagulation; IVK intravitreal kenalog; VMT vitreomacular traction; MH Macular hole;  NVD neovascularization of the disc; NVE neovascularization elsewhere; AREDS age related eye disease study; ARMD age related macular degeneration; POAG primary open angle glaucoma; EBMD epithelial/anterior basement membrane dystrophy; ACIOL anterior chamber intraocular lens; IOL intraocular lens; PCIOL posterior chamber intraocular lens; Phaco/IOL phacoemulsification with intraocular lens placement; [place] photorefractive keratectomy; LASIK laser assisted in situ keratomileusis; HTN hypertension; DM diabetes mellitus; COPD chronic obstructive pulmonary disease

## 2020-11-13 ENCOUNTER — Ambulatory Visit (INDEPENDENT_AMBULATORY_CARE_PROVIDER_SITE_OTHER): Payer: Medicare Other | Admitting: Ophthalmology

## 2020-11-13 ENCOUNTER — Encounter (INDEPENDENT_AMBULATORY_CARE_PROVIDER_SITE_OTHER): Payer: Self-pay | Admitting: Ophthalmology

## 2020-11-13 ENCOUNTER — Other Ambulatory Visit: Payer: Self-pay

## 2020-11-13 DIAGNOSIS — H401133 Primary open-angle glaucoma, bilateral, severe stage: Secondary | ICD-10-CM

## 2020-11-13 DIAGNOSIS — H35033 Hypertensive retinopathy, bilateral: Secondary | ICD-10-CM

## 2020-11-13 DIAGNOSIS — Z961 Presence of intraocular lens: Secondary | ICD-10-CM | POA: Diagnosis not present

## 2020-11-13 DIAGNOSIS — I10 Essential (primary) hypertension: Secondary | ICD-10-CM

## 2020-11-13 DIAGNOSIS — H35052 Retinal neovascularization, unspecified, left eye: Secondary | ICD-10-CM

## 2020-11-13 DIAGNOSIS — H3581 Retinal edema: Secondary | ICD-10-CM | POA: Diagnosis not present

## 2020-11-13 DIAGNOSIS — H353221 Exudative age-related macular degeneration, left eye, with active choroidal neovascularization: Secondary | ICD-10-CM

## 2020-11-13 MED ORDER — BEVACIZUMAB CHEMO INJECTION 1.25MG/0.05ML SYRINGE FOR KALEIDOSCOPE
1.2500 mg | INTRAVITREAL | Status: AC | PRN
  Administered 2020-11-13: 1.25 mg via INTRAVITREAL

## 2021-01-06 DIAGNOSIS — H524 Presbyopia: Secondary | ICD-10-CM | POA: Diagnosis not present

## 2021-01-06 DIAGNOSIS — H401133 Primary open-angle glaucoma, bilateral, severe stage: Secondary | ICD-10-CM | POA: Diagnosis not present

## 2021-01-06 DIAGNOSIS — Z961 Presence of intraocular lens: Secondary | ICD-10-CM | POA: Diagnosis not present

## 2021-01-06 DIAGNOSIS — H35033 Hypertensive retinopathy, bilateral: Secondary | ICD-10-CM | POA: Diagnosis not present

## 2021-01-06 DIAGNOSIS — H353221 Exudative age-related macular degeneration, left eye, with active choroidal neovascularization: Secondary | ICD-10-CM | POA: Diagnosis not present

## 2021-01-15 DIAGNOSIS — M1A0710 Idiopathic chronic gout, right ankle and foot, without tophus (tophi): Secondary | ICD-10-CM | POA: Diagnosis not present

## 2021-01-15 DIAGNOSIS — E871 Hypo-osmolality and hyponatremia: Secondary | ICD-10-CM | POA: Diagnosis not present

## 2021-01-15 DIAGNOSIS — N189 Chronic kidney disease, unspecified: Secondary | ICD-10-CM | POA: Diagnosis not present

## 2021-01-15 DIAGNOSIS — N2581 Secondary hyperparathyroidism of renal origin: Secondary | ICD-10-CM | POA: Diagnosis not present

## 2021-01-15 DIAGNOSIS — I129 Hypertensive chronic kidney disease with stage 1 through stage 4 chronic kidney disease, or unspecified chronic kidney disease: Secondary | ICD-10-CM | POA: Diagnosis not present

## 2021-01-15 DIAGNOSIS — N184 Chronic kidney disease, stage 4 (severe): Secondary | ICD-10-CM | POA: Diagnosis not present

## 2021-01-15 DIAGNOSIS — D631 Anemia in chronic kidney disease: Secondary | ICD-10-CM | POA: Diagnosis not present

## 2021-01-27 ENCOUNTER — Other Ambulatory Visit (HOSPITAL_COMMUNITY): Payer: Self-pay | Admitting: *Deleted

## 2021-01-27 DIAGNOSIS — Z0000 Encounter for general adult medical examination without abnormal findings: Secondary | ICD-10-CM | POA: Diagnosis not present

## 2021-01-27 DIAGNOSIS — I10 Essential (primary) hypertension: Secondary | ICD-10-CM | POA: Diagnosis not present

## 2021-01-27 DIAGNOSIS — E782 Mixed hyperlipidemia: Secondary | ICD-10-CM | POA: Diagnosis not present

## 2021-01-28 ENCOUNTER — Other Ambulatory Visit: Payer: Self-pay

## 2021-01-28 ENCOUNTER — Encounter (HOSPITAL_COMMUNITY)
Admission: RE | Admit: 2021-01-28 | Discharge: 2021-01-28 | Disposition: A | Discharge status: Home / Self Care | Payer: Medicare Other | Source: Ambulatory Visit | Attending: Nephrology | Admitting: Nephrology

## 2021-01-28 DIAGNOSIS — N189 Chronic kidney disease, unspecified: Secondary | ICD-10-CM | POA: Diagnosis not present

## 2021-01-28 DIAGNOSIS — D631 Anemia in chronic kidney disease: Secondary | ICD-10-CM | POA: Insufficient documentation

## 2021-01-28 MED ORDER — SODIUM CHLORIDE 0.9 % IV SOLN
510.0000 mg | INTRAVENOUS | Status: DC
  Administered 2021-01-28: 510 mg via INTRAVENOUS
  Filled 2021-01-28: qty 510

## 2021-01-28 MED ORDER — EPOETIN ALFA-EPBX 10000 UNIT/ML IJ SOLN
20000.0000 [IU] | Freq: Once | INTRAMUSCULAR | Status: AC
  Administered 2021-01-28: 20000 [IU] via SUBCUTANEOUS

## 2021-01-29 DIAGNOSIS — I129 Hypertensive chronic kidney disease with stage 1 through stage 4 chronic kidney disease, or unspecified chronic kidney disease: Secondary | ICD-10-CM | POA: Diagnosis not present

## 2021-01-29 LAB — POCT HEMOGLOBIN-HEMACUE: Hemoglobin: 8.6 g/dL — ABNORMAL LOW (ref 13.0–17.0)

## 2021-02-02 NOTE — Progress Notes (Addendum)
[facility] Note  02/05/2021     CHIEF COMPLAINT Patient presents for Retina Follow Up   HISTORY OF PRESENT ILLNESS: Danny Rhodes is a 87 y.o. male who presents to the clinic today for:  HPI     Retina Follow Up   Patient presents with  Wet AMD.  In left eye.  This started 12 weeks ago.  I, the attending physician,  performed the HPI with the patient and updated documentation appropriately.        Comments   Patient her for 12 weeks retina follow up for exu ARMD OS. Patient states vision doing good. No eye pain.       Last edited by Bernarda Caffey, MD on 02/05/2021 12:53 PM.    Pt states no change in vision   Referring physician: Marshall Cork, MD [address] [place] [address]  HISTORICAL INFORMATION:   Selected notes from the MEDICAL RECORD NUMBER Referred by Dr. Kathlen Mody LEE: 07.30.21 BCVA OD 20/40, OS 20/50 Ocular Hx- POAG OU severe, Cataracts OU, HTN Retinopathy OU, NVAMD OS, Band Keratopathy OU   CURRENT MEDICATIONS: Current Outpatient Medications (Ophthalmic Drugs)  Medication Sig   dorzolamide (TRUSOPT) 2 % ophthalmic solution 1 drop into affected eye   latanoprost (XALATAN) 0.005 % ophthalmic solution 1 drop into affected eye in the evening   No current facility-administered medications for this visit. (Ophthalmic Drugs)   Current Outpatient Medications (Other)  Medication Sig   acetaminophen (TYLENOL) 325 MG tablet Take 2 tablets (650 mg total) by mouth every 6 (six) hours as needed for up to 30 doses.   acetaminophen (TYLENOL) 500 MG tablet Take 1,000 mg by mouth 2 (two) times daily.   amLODipine (NORVASC) 10 MG tablet Take 10 mg by mouth daily.   amLODipine (NORVASC) 5 MG tablet Take 5 mg by mouth daily.   aspirin EC 81 MG tablet Take 81 mg by mouth daily.   atorvastatin (LIPITOR) 20 MG tablet Take 20 mg by mouth daily at 6 PM.   furosemide (LASIX) 20 MG tablet Take 20 mg by mouth daily.   ibuprofen  (ADVIL) 400 MG tablet Take 1 tablet (400 mg total) by mouth every 6 (six) hours as needed for up to 10 doses.   metoprolol tartrate (LOPRESSOR) 50 MG tablet Take 50 mg by mouth 2 (two) times daily.   olmesartan-hydrochlorothiazide (BENICAR HCT) 40-25 MG tablet Take 1 tablet by mouth daily.   potassium chloride SA (KLOR-CON) 20 MEQ tablet potassium chloride ER 20 mEq tablet,extended release(part/cryst)   traMADol (ULTRAM) 50 MG tablet Take 1 tablet (50 mg total) by mouth at bedtime as needed for up to 6 doses for severe pain.   valsartan-hydrochlorothiazide (DIOVAN-HCT) 320-25 MG tablet Take 1 tablet by mouth daily.   vitamin C (ASCORBIC ACID) 500 MG tablet Take 500 mg by mouth daily.   No current facility-administered medications for this visit. (Other)    REVIEW OF SYSTEMS: ROS   Positive for: Musculoskeletal, Eyes Negative for: Constitutional, Gastrointestinal, Neurological, Skin, Genitourinary, HENT, Endocrine, Cardiovascular, Respiratory, Psychiatric, Allergic/Imm, Heme/Lymph Last edited by Theodore Demark, COA on 02/05/2021  9:02 AM.      ALLERGIES No Known Allergies  PAST MEDICAL HISTORY Past Medical History:  Diagnosis Date   Glaucoma    POAG OU   Hypertensive retinopathy    OU   Past Surgical History:  Procedure Laterality Date   CATARACT EXTRACTION Bilateral    EYE SURGERY Bilateral  Cat Sx    FAMILY HISTORY History reviewed. No pertinent family history.  SOCIAL HISTORY         OPHTHALMIC EXAM:  Base Eye Exam     Visual Acuity (Snellen - Linear)       Right Left   Dist [place] 20/25 -2 20/30   Dist ph [place] 20/25         Tonometry (Tonopen, 8:59 AM)       Right Left   Pressure 11 10         Pupils       Dark Light Shape React APD   Right 2 1 Round Minimal None   Left 2 1 Round Minimal None         Visual Fields (Counting fingers)       Left Right    Full Full         Extraocular Movement       Right Left    Full Full          Neuro/Psych     Oriented x3: Yes   Mood/Affect: Normal         Dilation     Both eyes: 1.0% Mydriacyl, 2.5% Phenylephrine @ 8:58 AM           Slit Lamp and Fundus Exam     Slit Lamp Exam       Right Left   Lids/Lashes Dermatochalasis - upper lid Dermatochalasis - upper lid   Conjunctiva/Sclera mild Melanosis Mild melanosis, temporal pinguecula   Cornea arcus, 1-2+ Punctate epithelial erosions arcus, 1+ Punctate epithelial erosions, early band K, well healed temporal cataract wounds, mild central haze   Anterior Chamber deep and clear, narrow temporal angle deep and narrow temporal angle, 0.5+cell/pigment   Iris Round and moderately dilated  Round and moderately dilated    Lens PCIOL PC IOL in good position   Vitreous Vitreous syneresis Vitreous syneresis         Fundus Exam       Right Left   Disc 2+Pallor, +cupping, +PPA/PPP, thin superior rim 2+Pallor, +cupping, scattered PPP, thin inferior rim   C/D Ratio 0.6 0.7   Macula Flat, Blunted foveal reflex, RPE mottling, clumping and atrophy, Drusen, No heme or edema Blunted foveal reflex, focal pigment ring/CNV with stable improvement in shallow SRF IT to fovea, RPE mottling, clumping and atrophy, no heme   Vessels Vascular attenuation, mild tortuousity Vascular attenuation, mild tortuousity, focal perivascular pigment clumping temporally   Periphery Attached, No heme  Attached, focal perivascular pigment clumping temporally, No heme             IMAGING AND PROCEDURES  Imaging and Procedures for 02/05/2021  OCT, Retina - OU - Both Eyes       Right Eye Quality was good. Central Foveal Thickness: 220. Progression has been stable. Findings include normal foveal contour, no IRF, no SRF, retinal drusen (Trace ERM; partial PVD).   Left Eye Quality was good. Central Foveal Thickness: 205. Progression has been stable. Findings include pigment epithelial detachment, normal foveal contour, no IRF, no SRF, outer retinal  atrophy, retinal drusen (Stable focal PED, stable improvement in IRF/SRF overlying PED).   Notes *Images captured and stored on drive  Diagnosis / Impression:  OD: NFP, no IRF/SRF OS: Stable focal PED, stable improvement in IRF/SRF overlying PED -- remains resolved  Clinical management:  See below  Abbreviations: NFP - Normal foveal profile. CME - cystoid macular edema. PED - pigment epithelial  detachment. IRF - intraretinal fluid. SRF - subretinal fluid. EZ - ellipsoid zone. ERM - epiretinal membrane. ORA - outer retinal atrophy. ORT - outer retinal tubulation. SRHM - subretinal hyper-reflective material. IRHM - intraretinal hyper-reflective material           ASSESSMENT/PLAN:    ICD-10-CM   1. Choroidal neovascular membrane of left eye  H35.052     2. Exudative age-related macular degeneration of left eye with active choroidal neovascularization ([place])  H35.3221     3. Retinal edema  H35.81 OCT, Retina - OU - Both Eyes    4. Essential hypertension  I10     5. Hypertensive retinopathy of both eyes  H35.033     6. Pseudophakia, both eyes  Z96.1     7. Primary open angle glaucoma (POAG) of both eyes, severe stage  H40.1133       1-3. Choroidal neovascular membrane w/ +SRF  - idiopathic CNV vs Exudative age related macular degeneration, OS  - interestingly, OD without drusen and OS with minimal drusen, which we would see in typical ARMD   - s/p IVA OS #1 (08.04.21), #2 (09.02.21), #3 (10.05.21), #4 (11.11.21), #5 (12.23.21), #6 (2.3.22), #7 (03.31.22), #8 (06.09.22)  - BCVA OS 20/25  - OCT shows Stable focal PED, stable improvement in IRF/SRF at 12 wks  - recommend holding IVA OS today, 09.01.22 -- pt in agreement  - f/u in 3 months, sooner -- DFE/OCT  4,5. Hypertensive retinopathy OU - discussed importance of tight BP control - monitor   6. Pseudophakia OU  - s/p CE/IOL (Weaver, OD 8.11.21; OS: 09.22.21)  - IOLs in good position, doing well  7. POAG OU  - under  the expert management of Dr. Kathlen Mody  - IOP 10 OU today  - on dorzolamide BID and latanoprost QHS per Dr. Kathlen Mody   Ophthalmic Meds Ordered this visit:  No orders of the defined types were placed in this encounter.     Return in about 3 months (around 05/07/2021) for f/u CNV OS, DFE, OCT.  There are no Patient Instructions on file for this visit.   Explained the diagnoses, plan, and follow up with the patient and they expressed understanding.  Patient expressed understanding of the importance of proper follow up care.   This document serves as a record of services personally performed by Gardiner Sleeper, MD, PhD. It was created on their behalf by San Jetty. Owens Shark, OA an ophthalmic technician. The creation of this record is the provider's dictation and/or activities during the visit.    Electronically signed by: San Jetty. Owens Shark, [place] 08.29.2022 12:58 PM  Gardiner Sleeper, M.D., Ph.D. Diseases & Surgery of the Retina and Vitreous [facility]  I have reviewed the above documentation for accuracy and completeness, and I agree with the above. Gardiner Sleeper, M.D., Ph.D. 02/05/21 12:58 PM  Abbreviations: M myopia (nearsighted); A astigmatism; H hyperopia (farsighted); P presbyopia; Mrx spectacle prescription;  CTL contact lenses; OD right eye; OS left eye; OU both eyes  XT exotropia; ET esotropia; PEK punctate epithelial keratitis; PEE punctate epithelial erosions; DES dry eye syndrome; MGD meibomian gland dysfunction; ATs artificial tears; PFAT's preservative free artificial tears; [place] nuclear sclerotic cataract; PSC posterior subcapsular cataract; ERM epi-retinal membrane; PVD posterior vitreous detachment; RD retinal detachment; DM diabetes mellitus; DR diabetic retinopathy; NPDR non-proliferative diabetic retinopathy; PDR proliferative diabetic retinopathy; CSME clinically significant macular edema; DME diabetic macular edema; dbh dot blot hemorrhages; CWS cotton wool spot;  POAG primary open angle glaucoma; C/D cup-to-disc ratio; HVF humphrey visual field; GVF goldmann visual field; OCT optical coherence tomography; IOP intraocular pressure; BRVO Branch retinal vein occlusion; CRVO central retinal vein occlusion; CRAO central retinal artery occlusion; BRAO branch retinal artery occlusion; RT retinal tear; SB scleral buckle; PPV pars plana vitrectomy; VH Vitreous hemorrhage; PRP panretinal laser photocoagulation; IVK intravitreal kenalog; VMT vitreomacular traction; MH Macular hole;  NVD neovascularization of the disc; NVE neovascularization elsewhere; AREDS age related eye disease study; ARMD age related macular degeneration; POAG primary open angle glaucoma; EBMD epithelial/anterior basement membrane dystrophy; ACIOL anterior chamber intraocular lens; IOL intraocular lens; PCIOL posterior chamber intraocular lens; Phaco/IOL phacoemulsification with intraocular lens placement; [place] photorefractive keratectomy; LASIK laser assisted in situ keratomileusis; HTN hypertension; DM diabetes mellitus; COPD chronic obstructive pulmonary disease

## 2021-02-03 DIAGNOSIS — I129 Hypertensive chronic kidney disease with stage 1 through stage 4 chronic kidney disease, or unspecified chronic kidney disease: Secondary | ICD-10-CM | POA: Diagnosis not present

## 2021-02-03 DIAGNOSIS — D638 Anemia in other chronic diseases classified elsewhere: Secondary | ICD-10-CM | POA: Diagnosis not present

## 2021-02-03 DIAGNOSIS — M25551 Pain in right hip: Secondary | ICD-10-CM | POA: Diagnosis not present

## 2021-02-03 DIAGNOSIS — N184 Chronic kidney disease, stage 4 (severe): Secondary | ICD-10-CM | POA: Diagnosis not present

## 2021-02-03 DIAGNOSIS — E7800 Pure hypercholesterolemia, unspecified: Secondary | ICD-10-CM | POA: Diagnosis not present

## 2021-02-03 DIAGNOSIS — Z0000 Encounter for general adult medical examination without abnormal findings: Secondary | ICD-10-CM | POA: Diagnosis not present

## 2021-02-05 ENCOUNTER — Encounter (INDEPENDENT_AMBULATORY_CARE_PROVIDER_SITE_OTHER): Payer: Self-pay | Admitting: Ophthalmology

## 2021-02-05 ENCOUNTER — Other Ambulatory Visit (HOSPITAL_COMMUNITY): Payer: Self-pay | Admitting: *Deleted

## 2021-02-05 ENCOUNTER — Other Ambulatory Visit: Payer: Self-pay

## 2021-02-05 ENCOUNTER — Ambulatory Visit (INDEPENDENT_AMBULATORY_CARE_PROVIDER_SITE_OTHER): Payer: Medicare Other | Admitting: Ophthalmology

## 2021-02-05 DIAGNOSIS — H353221 Exudative age-related macular degeneration, left eye, with active choroidal neovascularization: Secondary | ICD-10-CM | POA: Diagnosis not present

## 2021-02-05 DIAGNOSIS — H35052 Retinal neovascularization, unspecified, left eye: Secondary | ICD-10-CM

## 2021-02-05 DIAGNOSIS — H35033 Hypertensive retinopathy, bilateral: Secondary | ICD-10-CM | POA: Diagnosis not present

## 2021-02-05 DIAGNOSIS — I10 Essential (primary) hypertension: Secondary | ICD-10-CM

## 2021-02-05 DIAGNOSIS — H3581 Retinal edema: Secondary | ICD-10-CM

## 2021-02-05 DIAGNOSIS — Z961 Presence of intraocular lens: Secondary | ICD-10-CM | POA: Diagnosis not present

## 2021-02-05 DIAGNOSIS — H401133 Primary open-angle glaucoma, bilateral, severe stage: Secondary | ICD-10-CM | POA: Diagnosis not present

## 2021-02-06 ENCOUNTER — Encounter (HOSPITAL_COMMUNITY)
Admission: RE | Admit: 2021-02-06 | Discharge: 2021-02-06 | Disposition: A | Discharge status: Home / Self Care | Payer: Medicare Other | Source: Ambulatory Visit | Attending: Nephrology | Admitting: Nephrology

## 2021-02-06 DIAGNOSIS — D631 Anemia in chronic kidney disease: Secondary | ICD-10-CM | POA: Insufficient documentation

## 2021-02-06 DIAGNOSIS — N189 Chronic kidney disease, unspecified: Secondary | ICD-10-CM | POA: Insufficient documentation

## 2021-02-06 MED ORDER — SODIUM CHLORIDE 0.9 % IV SOLN
510.0000 mg | INTRAVENOUS | Status: DC
  Administered 2021-02-06: 510 mg via INTRAVENOUS
  Filled 2021-02-06: qty 510

## 2021-02-25 ENCOUNTER — Encounter (HOSPITAL_COMMUNITY)
Admission: RE | Admit: 2021-02-25 | Discharge: 2021-02-25 | Disposition: A | Discharge status: Home / Self Care | Payer: Medicare Other | Source: Ambulatory Visit | Attending: Nephrology | Admitting: Nephrology

## 2021-02-25 DIAGNOSIS — D631 Anemia in chronic kidney disease: Secondary | ICD-10-CM | POA: Insufficient documentation

## 2021-02-25 LAB — POCT HEMOGLOBIN-HEMACUE: Hemoglobin: 10.1 g/dL — ABNORMAL LOW (ref 13.0–17.0)

## 2021-02-25 MED ORDER — EPOETIN ALFA-EPBX 10000 UNIT/ML IJ SOLN
INTRAMUSCULAR | Status: AC
  Administered 2021-02-25: 20000 [IU] via SUBCUTANEOUS
  Filled 2021-02-25: qty 2

## 2021-02-25 MED ORDER — EPOETIN ALFA-EPBX 10000 UNIT/ML IJ SOLN: 20000.0000 [IU] | INTRAMUSCULAR | Status: DC

## 2021-03-13 DIAGNOSIS — Z23 Encounter for immunization: Secondary | ICD-10-CM | POA: Diagnosis not present

## 2021-03-25 ENCOUNTER — Encounter (HOSPITAL_COMMUNITY)
Admission: RE | Admit: 2021-03-25 | Discharge: 2021-03-25 | Disposition: A | Discharge status: Home / Self Care | Payer: Medicare Other | Source: Ambulatory Visit | Attending: Nephrology | Admitting: Nephrology

## 2021-03-25 DIAGNOSIS — D631 Anemia in chronic kidney disease: Secondary | ICD-10-CM | POA: Insufficient documentation

## 2021-03-25 LAB — RENAL FUNCTION PANEL
Albumin: 4.1 g/dL (ref 3.5–5.0)
Anion gap: 8 (ref 5–15)
BUN: 41 mg/dL — ABNORMAL HIGH (ref 8–23)
CO2: 25 mmol/L (ref 22–32)
Calcium: 10.3 mg/dL (ref 8.9–10.3)
Chloride: 102 mmol/L (ref 98–111)
Creatinine, Ser: 2.23 mg/dL — ABNORMAL HIGH (ref 0.61–1.24)
GFR, Estimated: 27 mL/min — ABNORMAL LOW (ref >60)
Glucose, Bld: 100 mg/dL — ABNORMAL HIGH (ref 70–99)
Phosphorus: 3.6 mg/dL (ref 2.5–4.6)
Potassium: 3.2 mmol/L — ABNORMAL LOW (ref 3.5–5.1)
Sodium: 135 mmol/L (ref 135–145)

## 2021-03-25 LAB — IRON AND TIBC
Iron: 57 ug/dL (ref 45–182)
Saturation Ratios: 25 % (ref 17.9–39.5)
TIBC: 230 ug/dL — ABNORMAL LOW (ref 250–450)
UIBC: 173 ug/dL

## 2021-03-25 LAB — POCT HEMOGLOBIN-HEMACUE: Hemoglobin: 11.5 g/dL — ABNORMAL LOW (ref 13.0–17.0)

## 2021-03-25 LAB — FERRITIN: Ferritin: 1017 ng/mL — ABNORMAL HIGH (ref 24–336)

## 2021-03-25 MED ORDER — EPOETIN ALFA-EPBX 10000 UNIT/ML IJ SOLN
20000.0000 [IU] | INTRAMUSCULAR | Status: DC
  Administered 2021-03-25: 20000 [IU] via SUBCUTANEOUS

## 2021-03-25 MED ORDER — EPOETIN ALFA-EPBX 10000 UNIT/ML IJ SOLN
INTRAMUSCULAR | Status: AC
  Filled 2021-03-25: qty 2

## 2021-04-03 DIAGNOSIS — I129 Hypertensive chronic kidney disease with stage 1 through stage 4 chronic kidney disease, or unspecified chronic kidney disease: Secondary | ICD-10-CM | POA: Diagnosis not present

## 2021-04-06 DIAGNOSIS — E7800 Pure hypercholesterolemia, unspecified: Secondary | ICD-10-CM | POA: Diagnosis not present

## 2021-04-06 DIAGNOSIS — N184 Chronic kidney disease, stage 4 (severe): Secondary | ICD-10-CM | POA: Diagnosis not present

## 2021-04-06 DIAGNOSIS — I129 Hypertensive chronic kidney disease with stage 1 through stage 4 chronic kidney disease, or unspecified chronic kidney disease: Secondary | ICD-10-CM | POA: Diagnosis not present

## 2021-04-22 ENCOUNTER — Encounter (HOSPITAL_COMMUNITY): Payer: Medicare Other

## 2021-04-23 ENCOUNTER — Encounter: Payer: Self-pay | Admitting: Nephrology

## 2021-04-23 ENCOUNTER — Ambulatory Visit (HOSPITAL_COMMUNITY)
Admission: RE | Admit: 2021-04-23 | Discharge: 2021-04-23 | Disposition: A | Discharge status: Home / Self Care | Payer: Medicare Other | Source: Ambulatory Visit | Attending: Nephrology | Admitting: Nephrology

## 2021-04-23 DIAGNOSIS — N189 Chronic kidney disease, unspecified: Secondary | ICD-10-CM | POA: Diagnosis not present

## 2021-04-23 DIAGNOSIS — D631 Anemia in chronic kidney disease: Secondary | ICD-10-CM | POA: Insufficient documentation

## 2021-04-23 DIAGNOSIS — Z862 Personal history of diseases of the blood and blood-forming organs and certain disorders involving the immune mechanism: Secondary | ICD-10-CM | POA: Insufficient documentation

## 2021-04-23 DIAGNOSIS — N184 Chronic kidney disease, stage 4 (severe): Secondary | ICD-10-CM | POA: Insufficient documentation

## 2021-04-23 LAB — POCT HEMOGLOBIN-HEMACUE: Hemoglobin: 10.2 g/dL — ABNORMAL LOW (ref 13.0–17.0)

## 2021-04-23 MED ORDER — EPOETIN ALFA-EPBX 40000 UNIT/ML IJ SOLN
INTRAMUSCULAR | Status: AC
  Filled 2021-04-23: qty 1

## 2021-04-23 MED ORDER — EPOETIN ALFA-EPBX 10000 UNIT/ML IJ SOLN
20000.0000 [IU] | INTRAMUSCULAR | Status: DC
  Administered 2021-04-23: 10:00:00 20000 [IU] via SUBCUTANEOUS

## 2021-05-04 NOTE — Progress Notes (Addendum)
[facility] Note  05/07/2021     CHIEF COMPLAINT Patient presents for Retina Follow Up   HISTORY OF PRESENT ILLNESS: Danny Rhodes is a 87 y.o. male who presents to the clinic today for:  HPI     Retina Follow Up   Patient presents with  Other.  In left eye.  This started years ago.  Severity is moderate.  Duration of 3 months.  Since onset it is stable.  I, the attending physician,  performed the HPI with the patient and updated documentation appropriately.        Comments   87 y/o male pt here for 3 mo f/u for CNV OS.  No change in [place] OU.  Denies pain, FOL, floaters.  Dorzolamide BID OU, Latanoprost QHS OU.      Last edited by Bernarda Caffey, MD on 05/07/2021  9:51 AM.    Pt states no change in vision, no new health concerns  Referring physician: Marshall Cork, MD [place] [address]  HISTORICAL INFORMATION:   Selected notes from the MEDICAL RECORD NUMBER Referred by Dr. Kathlen Mody LEE: 07.30.21 BCVA OD 20/40, OS 20/50 Ocular Hx- POAG OU severe, Cataracts OU, HTN Retinopathy OU, NVAMD OS, Band Keratopathy OU   CURRENT MEDICATIONS: Current Outpatient Medications (Ophthalmic Drugs)  Medication Sig   dorzolamide (TRUSOPT) 2 % ophthalmic solution 1 drop into affected eye   latanoprost (XALATAN) 0.005 % ophthalmic solution 1 drop into affected eye in the evening   No current facility-administered medications for this visit. (Ophthalmic Drugs)   Current Outpatient Medications (Other)  Medication Sig   acetaminophen (TYLENOL) 325 MG tablet Take 2 tablets (650 mg total) by mouth every 6 (six) hours as needed for up to 30 doses.   acetaminophen (TYLENOL) 500 MG tablet Take 1,000 mg by mouth 2 (two) times daily.   allopurinol (ZYLOPRIM) 100 MG tablet 1 tablet   amLODipine (NORVASC) 10 MG tablet Take 10 mg by mouth daily.   amLODipine (NORVASC) 5 MG tablet Take 5 mg by mouth daily.   aspirin EC 81 MG tablet Take 81  mg by mouth daily.   atorvastatin (LIPITOR) 20 MG tablet Take 20 mg by mouth daily at 6 PM.   carvedilol (COREG) 3.125 MG tablet 1 tablet with food   furosemide (LASIX) 20 MG tablet Take 20 mg by mouth daily.   ibuprofen (ADVIL) 400 MG tablet Take 1 tablet (400 mg total) by mouth every 6 (six) hours as needed for up to 10 doses.   metoprolol tartrate (LOPRESSOR) 50 MG tablet Take 50 mg by mouth 2 (two) times daily.   olmesartan-hydrochlorothiazide (BENICAR HCT) 40-25 MG tablet Take 1 tablet by mouth daily.   potassium chloride SA (KLOR-CON) 20 MEQ tablet potassium chloride ER 20 mEq tablet,extended release(part/cryst)   traMADol (ULTRAM) 50 MG tablet Take 1 tablet (50 mg total) by mouth at bedtime as needed for up to 6 doses for severe pain.   valsartan-hydrochlorothiazide (DIOVAN-HCT) 320-25 MG tablet Take 1 tablet by mouth daily.   vitamin C (ASCORBIC ACID) 500 MG tablet Take 500 mg by mouth daily.   atorvastatin (LIPITOR) 20 MG tablet Take 1 tablet by mouth daily. (Patient not taking: Reported on 05/07/2021)   No current facility-administered medications for this visit. (Other)   REVIEW OF SYSTEMS: ROS   Positive for: Genitourinary, Eyes Negative for: Constitutional, Gastrointestinal, Neurological, Skin, Musculoskeletal, HENT, Endocrine, Cardiovascular, Respiratory, Psychiatric, Allergic/Imm, Heme/Lymph Last edited by Renold Genta,  Judithann Graves, COA on 05/07/2021  8:56 AM.      ALLERGIES No Known Allergies  PAST MEDICAL HISTORY Past Medical History:  Diagnosis Date   CKD (chronic kidney disease), symptom management only, stage 4 (severe) (HCC)    Glaucoma    POAG OU   History of anemia due to chronic kidney disease    Hypertensive retinopathy    OU   Past Surgical History:  Procedure Laterality Date   CATARACT EXTRACTION Bilateral    EYE SURGERY Bilateral    Cat Sx   FAMILY HISTORY History reviewed. No pertinent family history.  SOCIAL HISTORY Social History   Tobacco Use    Smoking status: Never   Smokeless tobacco: Never       OPHTHALMIC EXAM:  Base Eye Exam     Visual Acuity (Snellen - Linear)       Right Left   Dist [place] 20/25 -2 20/30   Dist ph [place] NI 20/25         Tonometry (Tonopen, 9:03 AM)       Right Left   Pressure 10 11         Pupils       Dark Light Shape React APD   Right 2 1 Round Minimal None   Left 2 1 Round Minimal None         Visual Fields (Counting fingers)       Left Right    Full Full         Extraocular Movement       Right Left    Full Full         Neuro/Psych     Oriented x3: Yes   Mood/Affect: Normal         Dilation     Both eyes: 1.0% Mydriacyl, 2.5% Phenylephrine @ 9:00 AM           Slit Lamp and Fundus Exam     Slit Lamp Exam       Right Left   Lids/Lashes Dermatochalasis - upper lid Dermatochalasis - upper lid   Conjunctiva/Sclera mild Melanosis Mild melanosis, temporal pinguecula   Cornea arcus, 1-2+ Punctate epithelial erosions, well healed cataract wound, early bank K, mild central haze arcus, 1+ Punctate epithelial erosions, early band K, well healed temporal cataract wounds, mild central haze   Anterior Chamber deep and clear, narrow temporal angle deep and narrow temporal angle, 0.5+cell/pigment   Iris Irregularly dilated, poor dilation of superior and temporal segments Irregularly dilated, poor dilation of temporal iris   Lens PCIOL PC IOL in good position   Anterior Vitreous Vitreous syneresis Vitreous syneresis         Fundus Exam       Right Left   Disc 2+Pallor, +cupping, +PPA/PPP, thin superior rim 2+Pallor, +cupping, scattered PPP, thin inferior rim   C/D Ratio 0.7 0.7   Macula Flat, Blunted foveal reflex, RPE mottling, clumping and atrophy, Drusen, No heme or edema Blunted foveal reflex, focal pigment ring/CNV with stable improvement in shallow SRF IT to fovea, RPE mottling, clumping and atrophy, no heme   Vessels Vascular attenuation, mild tortuousity,  Copper wiring, mild AV crossing changes Vascular attenuation, mild tortuousity, focal perivascular pigment clumping temporally, Copper wiring   Periphery Attached, No heme  Attached, focal perivascular pigment clumping temporally, No heme           IMAGING AND PROCEDURES  Imaging and Procedures for 05/07/2021  OCT, Retina - OU - Both Eyes  Right Eye Quality was good. Central Foveal Thickness: 220. Progression has been stable. Findings include normal foveal contour, no IRF, no SRF, retinal drusen (Trace ERM; partial PVD).   Left Eye Quality was good. Central Foveal Thickness: 207. Progression has been stable. Findings include pigment epithelial detachment, normal foveal contour, no IRF, no SRF, outer retinal atrophy, retinal drusen , subretinal hyper-reflective material (Interval conversion of PED to [facility], stable resolution of IRF/SRF overlying PED).   Notes *Images captured and stored on drive  Diagnosis / Impression:  OD: NFP, no IRF/SRF OS: Interval conversion of PED to [facility], stable resolution of IRF/SRF overlying PED  Clinical management:  See below  Abbreviations: NFP - Normal foveal profile. CME - cystoid macular edema. PED - pigment epithelial detachment. IRF - intraretinal fluid. SRF - subretinal fluid. EZ - ellipsoid zone. ERM - epiretinal membrane. ORA - outer retinal atrophy. ORT - outer retinal tubulation. SRHM - subretinal hyper-reflective material. IRHM - intraretinal hyper-reflective material            ASSESSMENT/PLAN:    ICD-10-CM   1. Choroidal neovascular membrane of left eye  H35.052     2. Exudative age-related macular degeneration of left eye with active choroidal neovascularization ([place])  H35.3221     3. Retinal edema  H35.81 OCT, Retina - OU - Both Eyes    4. Essential hypertension  I10     5. Hypertensive retinopathy of both eyes  H35.033     6. Pseudophakia, both eyes  Z96.1     7. Primary open angle glaucoma (POAG) of both eyes,  severe stage  H40.1133       1-3. Choroidal neovascular membrane w/ +SRF  - idiopathic CNV vs Exudative age related macular degeneration, OS  - interestingly, OD without drusen and OS with minimal drusen, which we would see in typical ARMD   - s/p IVA OS #1 (08.04.21), #2 (09.02.21), #3 (10.05.21), #4 (11.11.21), #5 (12.23.21), #6 (2.3.22), #7 (03.31.22), #8 (06.09.22)  - BCVA OS 20/25  - OCT shows interval conversion of PED to [facility], stable resolution of IRF/SRF overlying PED at 12 wks  - recommend holding IVA OS again today -- pt in agreement  - f/u in 3 months, sooner -- DFE/OCT  4,5. Hypertensive retinopathy OU - discussed importance of tight BP control - monitor   6. Pseudophakia OU  - s/p CE/IOL (Weaver, OD 8.11.21; OS: 09.22.21)  - IOLs in good position, doing well  7. POAG OU  - under the expert management of Dr. Kathlen Mody  - IOP 10 OU today  - on dorzolamide BID and latanoprost QHS per Dr. Kathlen Mody   Ophthalmic Meds Ordered this visit:  No orders of the defined types were placed in this encounter.     Return in about 6 months (around 11/05/2021) for f/u CNVM OS, DFE, OCT.  There are no Patient Instructions on file for this visit.   Explained the diagnoses, plan, and follow up with the patient and they expressed understanding.  Patient expressed understanding of the importance of proper follow up care.   This document serves as a record of services personally performed by Gardiner Sleeper, MD, PhD. It was created on their behalf by San Jetty. Owens Shark, OA an ophthalmic technician. The creation of this record is the provider's dictation and/or activities during the visit.    Electronically signed by: San Jetty. Owens Shark, [place] 11.28.2022 9:55 AM   Gardiner Sleeper, M.D., Ph.D. Diseases & Surgery of the Retina and Vitreous [facility] [place]  I have reviewed the above documentation for accuracy and completeness, and I agree with the above. Gardiner Sleeper, M.D., Ph.D.  05/07/21 9:55 AM   Abbreviations: M myopia (nearsighted); A astigmatism; H hyperopia (farsighted); P presbyopia; Mrx spectacle prescription;  CTL contact lenses; OD right eye; OS left eye; OU both eyes  XT exotropia; ET esotropia; PEK punctate epithelial keratitis; PEE punctate epithelial erosions; DES dry eye syndrome; MGD meibomian gland dysfunction; ATs artificial tears; PFAT's preservative free artificial tears; [place] nuclear sclerotic cataract; PSC posterior subcapsular cataract; ERM epi-retinal membrane; PVD posterior vitreous detachment; RD retinal detachment; DM diabetes mellitus; DR diabetic retinopathy; NPDR non-proliferative diabetic retinopathy; PDR proliferative diabetic retinopathy; CSME clinically significant macular edema; DME diabetic macular edema; dbh dot blot hemorrhages; CWS cotton wool spot; POAG primary open angle glaucoma; C/D cup-to-disc ratio; HVF humphrey visual field; GVF goldmann visual field; OCT optical coherence tomography; IOP intraocular pressure; BRVO Branch retinal vein occlusion; CRVO central retinal vein occlusion; CRAO central retinal artery occlusion; BRAO branch retinal artery occlusion; RT retinal tear; SB scleral buckle; PPV pars plana vitrectomy; VH Vitreous hemorrhage; PRP panretinal laser photocoagulation; IVK intravitreal kenalog; VMT vitreomacular traction; MH Macular hole;  NVD neovascularization of the disc; NVE neovascularization elsewhere; AREDS age related eye disease study; ARMD age related macular degeneration; POAG primary open angle glaucoma; EBMD epithelial/anterior basement membrane dystrophy; ACIOL anterior chamber intraocular lens; IOL intraocular lens; PCIOL posterior chamber intraocular lens; Phaco/IOL phacoemulsification with intraocular lens placement; [place] photorefractive keratectomy; LASIK laser assisted in situ keratomileusis; HTN hypertension; DM diabetes mellitus; COPD chronic obstructive pulmonary disease

## 2021-05-06 DIAGNOSIS — E7800 Pure hypercholesterolemia, unspecified: Secondary | ICD-10-CM | POA: Diagnosis not present

## 2021-05-06 DIAGNOSIS — N184 Chronic kidney disease, stage 4 (severe): Secondary | ICD-10-CM | POA: Diagnosis not present

## 2021-05-06 DIAGNOSIS — I129 Hypertensive chronic kidney disease with stage 1 through stage 4 chronic kidney disease, or unspecified chronic kidney disease: Secondary | ICD-10-CM | POA: Diagnosis not present

## 2021-05-07 ENCOUNTER — Ambulatory Visit (INDEPENDENT_AMBULATORY_CARE_PROVIDER_SITE_OTHER): Payer: Medicare Other | Admitting: Ophthalmology

## 2021-05-07 ENCOUNTER — Other Ambulatory Visit: Payer: Self-pay

## 2021-05-07 ENCOUNTER — Encounter (INDEPENDENT_AMBULATORY_CARE_PROVIDER_SITE_OTHER): Payer: Self-pay | Admitting: Ophthalmology

## 2021-05-07 DIAGNOSIS — H3581 Retinal edema: Secondary | ICD-10-CM

## 2021-05-07 DIAGNOSIS — H353221 Exudative age-related macular degeneration, left eye, with active choroidal neovascularization: Secondary | ICD-10-CM

## 2021-05-07 DIAGNOSIS — H401133 Primary open-angle glaucoma, bilateral, severe stage: Secondary | ICD-10-CM

## 2021-05-07 DIAGNOSIS — I10 Essential (primary) hypertension: Secondary | ICD-10-CM

## 2021-05-07 DIAGNOSIS — H35052 Retinal neovascularization, unspecified, left eye: Secondary | ICD-10-CM

## 2021-05-07 DIAGNOSIS — H35033 Hypertensive retinopathy, bilateral: Secondary | ICD-10-CM

## 2021-05-07 DIAGNOSIS — Z961 Presence of intraocular lens: Secondary | ICD-10-CM

## 2021-05-14 DIAGNOSIS — I129 Hypertensive chronic kidney disease with stage 1 through stage 4 chronic kidney disease, or unspecified chronic kidney disease: Secondary | ICD-10-CM | POA: Diagnosis not present

## 2021-05-14 DIAGNOSIS — N2581 Secondary hyperparathyroidism of renal origin: Secondary | ICD-10-CM | POA: Diagnosis not present

## 2021-05-14 DIAGNOSIS — D631 Anemia in chronic kidney disease: Secondary | ICD-10-CM | POA: Diagnosis not present

## 2021-05-14 DIAGNOSIS — M1A0710 Idiopathic chronic gout, right ankle and foot, without tophus (tophi): Secondary | ICD-10-CM | POA: Diagnosis not present

## 2021-05-14 DIAGNOSIS — E871 Hypo-osmolality and hyponatremia: Secondary | ICD-10-CM | POA: Diagnosis not present

## 2021-05-14 DIAGNOSIS — N184 Chronic kidney disease, stage 4 (severe): Secondary | ICD-10-CM | POA: Diagnosis not present

## 2021-05-21 ENCOUNTER — Ambulatory Visit (HOSPITAL_COMMUNITY)
Admission: RE | Admit: 2021-05-21 | Discharge: 2021-05-21 | Disposition: A | Discharge status: Home / Self Care | Payer: Medicare Other | Source: Ambulatory Visit | Attending: Nephrology | Admitting: Nephrology

## 2021-05-21 ENCOUNTER — Other Ambulatory Visit: Payer: Self-pay

## 2021-05-21 VITALS — BP 150/50 | HR 61 | Temp 97.4°F | Resp 20

## 2021-05-21 DIAGNOSIS — N189 Chronic kidney disease, unspecified: Secondary | ICD-10-CM | POA: Insufficient documentation

## 2021-05-21 DIAGNOSIS — Z862 Personal history of diseases of the blood and blood-forming organs and certain disorders involving the immune mechanism: Secondary | ICD-10-CM | POA: Insufficient documentation

## 2021-05-21 DIAGNOSIS — N184 Chronic kidney disease, stage 4 (severe): Secondary | ICD-10-CM | POA: Diagnosis not present

## 2021-05-21 LAB — RENAL FUNCTION PANEL
Albumin: 3.7 g/dL (ref 3.5–5.0)
Anion gap: 9 (ref 5–15)
BUN: 46 mg/dL — ABNORMAL HIGH (ref 8–23)
CO2: 25 mmol/L (ref 22–32)
Calcium: 9.8 mg/dL (ref 8.9–10.3)
Chloride: 102 mmol/L (ref 98–111)
Creatinine, Ser: 2.47 mg/dL — ABNORMAL HIGH (ref 0.61–1.24)
GFR, Estimated: 24 mL/min — ABNORMAL LOW (ref >60)
Glucose, Bld: 113 mg/dL — ABNORMAL HIGH (ref 70–99)
Phosphorus: 3.7 mg/dL (ref 2.5–4.6)
Potassium: 3.7 mmol/L (ref 3.5–5.1)
Sodium: 136 mmol/L (ref 135–145)

## 2021-05-21 LAB — POCT HEMOGLOBIN-HEMACUE: Hemoglobin: 9.6 g/dL — ABNORMAL LOW (ref 13.0–17.0)

## 2021-05-21 LAB — FERRITIN: Ferritin: 672 ng/mL — ABNORMAL HIGH (ref 24–336)

## 2021-05-21 LAB — IRON AND TIBC
Iron: 38 ug/dL — ABNORMAL LOW (ref 45–182)
Saturation Ratios: 19 % (ref 17.9–39.5)
TIBC: 204 ug/dL — ABNORMAL LOW (ref 250–450)
UIBC: 166 ug/dL

## 2021-05-21 MED ORDER — EPOETIN ALFA-EPBX 10000 UNIT/ML IJ SOLN
20000.0000 [IU] | INTRAMUSCULAR | Status: DC
  Administered 2021-05-21: 20000 [IU] via SUBCUTANEOUS

## 2021-05-21 MED ORDER — EPOETIN ALFA-EPBX 10000 UNIT/ML IJ SOLN
INTRAMUSCULAR | Status: AC
  Filled 2021-05-21: qty 2

## 2021-05-22 LAB — PTH, INTACT AND CALCIUM
Calcium, Total (PTH): 10 mg/dL (ref 8.6–10.2)
PTH: 163 pg/mL — ABNORMAL HIGH (ref 15–65)

## 2021-06-05 DIAGNOSIS — N184 Chronic kidney disease, stage 4 (severe): Secondary | ICD-10-CM | POA: Diagnosis not present

## 2021-06-05 DIAGNOSIS — I129 Hypertensive chronic kidney disease with stage 1 through stage 4 chronic kidney disease, or unspecified chronic kidney disease: Secondary | ICD-10-CM | POA: Diagnosis not present

## 2021-06-05 DIAGNOSIS — E7800 Pure hypercholesterolemia, unspecified: Secondary | ICD-10-CM | POA: Diagnosis not present

## 2021-06-18 ENCOUNTER — Ambulatory Visit (HOSPITAL_COMMUNITY)
Admission: RE | Admit: 2021-06-18 | Discharge: 2021-06-18 | Disposition: A | Discharge status: Home / Self Care | Payer: Medicare Other | Source: Ambulatory Visit | Attending: Nephrology | Admitting: Nephrology

## 2021-06-18 VITALS — BP 138/47 | HR 61 | Temp 97.3°F | Resp 18

## 2021-06-18 DIAGNOSIS — N184 Chronic kidney disease, stage 4 (severe): Secondary | ICD-10-CM | POA: Insufficient documentation

## 2021-06-18 DIAGNOSIS — N189 Chronic kidney disease, unspecified: Secondary | ICD-10-CM | POA: Insufficient documentation

## 2021-06-18 DIAGNOSIS — Z862 Personal history of diseases of the blood and blood-forming organs and certain disorders involving the immune mechanism: Secondary | ICD-10-CM | POA: Insufficient documentation

## 2021-06-18 LAB — POCT HEMOGLOBIN-HEMACUE: Hemoglobin: 9.8 g/dL — ABNORMAL LOW (ref 13.0–17.0)

## 2021-06-18 MED ORDER — EPOETIN ALFA-EPBX 10000 UNIT/ML IJ SOLN
20000.0000 [IU] | INTRAMUSCULAR | Status: DC
  Administered 2021-06-18: 20000 [IU] via SUBCUTANEOUS

## 2021-06-18 MED ORDER — EPOETIN ALFA-EPBX 10000 UNIT/ML IJ SOLN
INTRAMUSCULAR | Status: AC
  Filled 2021-06-18: qty 2

## 2021-07-09 DIAGNOSIS — H18423 Band keratopathy, bilateral: Secondary | ICD-10-CM | POA: Diagnosis not present

## 2021-07-09 DIAGNOSIS — H401133 Primary open-angle glaucoma, bilateral, severe stage: Secondary | ICD-10-CM | POA: Diagnosis not present

## 2021-07-16 ENCOUNTER — Ambulatory Visit (HOSPITAL_COMMUNITY)
Admission: RE | Admit: 2021-07-16 | Discharge: 2021-07-16 | Disposition: A | Discharge status: Home / Self Care | Payer: Medicare Other | Source: Ambulatory Visit | Attending: Nephrology | Admitting: Nephrology

## 2021-07-16 ENCOUNTER — Other Ambulatory Visit: Payer: Self-pay

## 2021-07-16 VITALS — BP 141/45 | HR 57

## 2021-07-16 DIAGNOSIS — N189 Chronic kidney disease, unspecified: Secondary | ICD-10-CM | POA: Insufficient documentation

## 2021-07-16 DIAGNOSIS — Z862 Personal history of diseases of the blood and blood-forming organs and certain disorders involving the immune mechanism: Secondary | ICD-10-CM | POA: Insufficient documentation

## 2021-07-16 DIAGNOSIS — N184 Chronic kidney disease, stage 4 (severe): Secondary | ICD-10-CM | POA: Insufficient documentation

## 2021-07-16 LAB — RENAL FUNCTION PANEL
Albumin: 3.7 g/dL (ref 3.5–5.0)
Anion gap: 9 (ref 5–15)
BUN: 55 mg/dL — ABNORMAL HIGH (ref 8–23)
CO2: 26 mmol/L (ref 22–32)
Calcium: 9.9 mg/dL (ref 8.9–10.3)
Chloride: 101 mmol/L (ref 98–111)
Creatinine, Ser: 2.4 mg/dL — ABNORMAL HIGH (ref 0.61–1.24)
GFR, Estimated: 25 mL/min — ABNORMAL LOW (ref >60)
Glucose, Bld: 100 mg/dL — ABNORMAL HIGH (ref 70–99)
Phosphorus: 4.3 mg/dL (ref 2.5–4.6)
Potassium: 4.1 mmol/L (ref 3.5–5.1)
Sodium: 136 mmol/L (ref 135–145)

## 2021-07-16 LAB — FERRITIN: Ferritin: 562 ng/mL — ABNORMAL HIGH (ref 24–336)

## 2021-07-16 LAB — IRON AND TIBC
Iron: 46 ug/dL (ref 45–182)
Saturation Ratios: 20 % (ref 17.9–39.5)
TIBC: 227 ug/dL — ABNORMAL LOW (ref 250–450)
UIBC: 181 ug/dL

## 2021-07-16 LAB — POCT HEMOGLOBIN-HEMACUE: Hemoglobin: 10 g/dL — ABNORMAL LOW (ref 13.0–17.0)

## 2021-07-16 MED ORDER — EPOETIN ALFA-EPBX 10000 UNIT/ML IJ SOLN
20000.0000 [IU] | INTRAMUSCULAR | Status: DC
  Administered 2021-07-16: 20000 [IU] via SUBCUTANEOUS

## 2021-07-16 MED ORDER — EPOETIN ALFA-EPBX 10000 UNIT/ML IJ SOLN
INTRAMUSCULAR | Status: AC
  Filled 2021-07-16: qty 2

## 2021-07-23 DIAGNOSIS — I129 Hypertensive chronic kidney disease with stage 1 through stage 4 chronic kidney disease, or unspecified chronic kidney disease: Secondary | ICD-10-CM | POA: Diagnosis not present

## 2021-07-23 DIAGNOSIS — E7800 Pure hypercholesterolemia, unspecified: Secondary | ICD-10-CM | POA: Diagnosis not present

## 2021-07-23 DIAGNOSIS — N184 Chronic kidney disease, stage 4 (severe): Secondary | ICD-10-CM | POA: Diagnosis not present

## 2021-07-23 DIAGNOSIS — D638 Anemia in other chronic diseases classified elsewhere: Secondary | ICD-10-CM | POA: Diagnosis not present

## 2021-07-23 DIAGNOSIS — S73001D Unspecified subluxation of right hip, subsequent encounter: Secondary | ICD-10-CM | POA: Diagnosis not present

## 2021-08-04 DIAGNOSIS — E7800 Pure hypercholesterolemia, unspecified: Secondary | ICD-10-CM | POA: Diagnosis not present

## 2021-08-04 DIAGNOSIS — N184 Chronic kidney disease, stage 4 (severe): Secondary | ICD-10-CM | POA: Diagnosis not present

## 2021-08-04 DIAGNOSIS — I129 Hypertensive chronic kidney disease with stage 1 through stage 4 chronic kidney disease, or unspecified chronic kidney disease: Secondary | ICD-10-CM | POA: Diagnosis not present

## 2021-08-13 ENCOUNTER — Ambulatory Visit (HOSPITAL_COMMUNITY)
Admission: RE | Admit: 2021-08-13 | Discharge: 2021-08-13 | Disposition: A | Discharge status: Home / Self Care | Payer: Medicare Other | Source: Ambulatory Visit | Attending: Nephrology | Admitting: Nephrology

## 2021-08-13 ENCOUNTER — Other Ambulatory Visit: Payer: Self-pay

## 2021-08-13 VITALS — BP 163/53 | HR 63 | Temp 97.7°F | Resp 18

## 2021-08-13 DIAGNOSIS — N189 Chronic kidney disease, unspecified: Secondary | ICD-10-CM | POA: Insufficient documentation

## 2021-08-13 DIAGNOSIS — N184 Chronic kidney disease, stage 4 (severe): Secondary | ICD-10-CM | POA: Diagnosis not present

## 2021-08-13 DIAGNOSIS — Z862 Personal history of diseases of the blood and blood-forming organs and certain disorders involving the immune mechanism: Secondary | ICD-10-CM | POA: Diagnosis not present

## 2021-08-13 MED ORDER — EPOETIN ALFA-EPBX 10000 UNIT/ML IJ SOLN
INTRAMUSCULAR | Status: AC
  Filled 2021-08-13: qty 2

## 2021-08-13 MED ORDER — EPOETIN ALFA-EPBX 10000 UNIT/ML IJ SOLN
20000.0000 [IU] | INTRAMUSCULAR | Status: DC
  Administered 2021-08-13: 10:00:00 20000 [IU] via SUBCUTANEOUS

## 2021-08-14 LAB — POCT HEMOGLOBIN-HEMACUE: Hemoglobin: 10.6 g/dL — ABNORMAL LOW (ref 13.0–17.0)

## 2021-09-04 DIAGNOSIS — N184 Chronic kidney disease, stage 4 (severe): Secondary | ICD-10-CM | POA: Diagnosis not present

## 2021-09-04 DIAGNOSIS — E7800 Pure hypercholesterolemia, unspecified: Secondary | ICD-10-CM | POA: Diagnosis not present

## 2021-09-04 DIAGNOSIS — I129 Hypertensive chronic kidney disease with stage 1 through stage 4 chronic kidney disease, or unspecified chronic kidney disease: Secondary | ICD-10-CM | POA: Diagnosis not present

## 2021-09-07 DIAGNOSIS — N184 Chronic kidney disease, stage 4 (severe): Secondary | ICD-10-CM | POA: Diagnosis not present

## 2021-09-07 DIAGNOSIS — N2581 Secondary hyperparathyroidism of renal origin: Secondary | ICD-10-CM | POA: Diagnosis not present

## 2021-09-07 DIAGNOSIS — E871 Hypo-osmolality and hyponatremia: Secondary | ICD-10-CM | POA: Diagnosis not present

## 2021-09-07 DIAGNOSIS — I129 Hypertensive chronic kidney disease with stage 1 through stage 4 chronic kidney disease, or unspecified chronic kidney disease: Secondary | ICD-10-CM | POA: Diagnosis not present

## 2021-09-07 DIAGNOSIS — M1A0710 Idiopathic chronic gout, right ankle and foot, without tophus (tophi): Secondary | ICD-10-CM | POA: Diagnosis not present

## 2021-09-07 DIAGNOSIS — D631 Anemia in chronic kidney disease: Secondary | ICD-10-CM | POA: Diagnosis not present

## 2021-09-10 ENCOUNTER — Ambulatory Visit (HOSPITAL_COMMUNITY)
Admission: RE | Admit: 2021-09-10 | Discharge: 2021-09-10 | Disposition: A | Discharge status: Home / Self Care | Payer: Medicare Other | Source: Ambulatory Visit | Attending: Nephrology | Admitting: Nephrology

## 2021-09-10 VITALS — BP 171/43 | HR 57 | Temp 97.3°F | Resp 18

## 2021-09-10 DIAGNOSIS — Z862 Personal history of diseases of the blood and blood-forming organs and certain disorders involving the immune mechanism: Secondary | ICD-10-CM | POA: Diagnosis not present

## 2021-09-10 DIAGNOSIS — N189 Chronic kidney disease, unspecified: Secondary | ICD-10-CM | POA: Insufficient documentation

## 2021-09-10 DIAGNOSIS — N184 Chronic kidney disease, stage 4 (severe): Secondary | ICD-10-CM | POA: Diagnosis not present

## 2021-09-10 LAB — IRON AND TIBC
Iron: 38 ug/dL — ABNORMAL LOW (ref 45–182)
Saturation Ratios: 17 % — ABNORMAL LOW (ref 17.9–39.5)
TIBC: 223 ug/dL — ABNORMAL LOW (ref 250–450)
UIBC: 185 ug/dL

## 2021-09-10 LAB — RENAL FUNCTION PANEL
Albumin: 3.8 g/dL (ref 3.5–5.0)
Anion gap: 9 (ref 5–15)
BUN: 45 mg/dL — ABNORMAL HIGH (ref 8–23)
CO2: 25 mmol/L (ref 22–32)
Calcium: 10.1 mg/dL (ref 8.9–10.3)
Chloride: 101 mmol/L (ref 98–111)
Creatinine, Ser: 2.56 mg/dL — ABNORMAL HIGH (ref 0.61–1.24)
GFR, Estimated: 23 mL/min — ABNORMAL LOW (ref >60)
Glucose, Bld: 97 mg/dL (ref 70–99)
Phosphorus: 3.8 mg/dL (ref 2.5–4.6)
Potassium: 3.6 mmol/L (ref 3.5–5.1)
Sodium: 135 mmol/L (ref 135–145)

## 2021-09-10 LAB — FERRITIN: Ferritin: 574 ng/mL — ABNORMAL HIGH (ref 24–336)

## 2021-09-10 LAB — POCT HEMOGLOBIN-HEMACUE: Hemoglobin: 10.3 g/dL — ABNORMAL LOW (ref 13.0–17.0)

## 2021-09-10 MED ORDER — EPOETIN ALFA-EPBX 10000 UNIT/ML IJ SOLN
INTRAMUSCULAR | Status: AC
  Filled 2021-09-10: qty 2

## 2021-09-10 MED ORDER — EPOETIN ALFA-EPBX 10000 UNIT/ML IJ SOLN
20000.0000 [IU] | INTRAMUSCULAR | Status: DC
  Administered 2021-09-10: 20000 [IU] via SUBCUTANEOUS

## 2021-09-11 LAB — PTH, INTACT AND CALCIUM
Calcium, Total (PTH): 10.1 mg/dL (ref 8.6–10.2)
PTH: 138 pg/mL — ABNORMAL HIGH (ref 15–65)

## 2021-10-04 DIAGNOSIS — N184 Chronic kidney disease, stage 4 (severe): Secondary | ICD-10-CM | POA: Diagnosis not present

## 2021-10-04 DIAGNOSIS — I129 Hypertensive chronic kidney disease with stage 1 through stage 4 chronic kidney disease, or unspecified chronic kidney disease: Secondary | ICD-10-CM | POA: Diagnosis not present

## 2021-10-04 DIAGNOSIS — E7800 Pure hypercholesterolemia, unspecified: Secondary | ICD-10-CM | POA: Diagnosis not present

## 2021-10-08 ENCOUNTER — Ambulatory Visit (HOSPITAL_COMMUNITY)
Admission: RE | Admit: 2021-10-08 | Discharge: 2021-10-08 | Disposition: A | Discharge status: Home / Self Care | Payer: Medicare Other | Source: Ambulatory Visit | Attending: Nephrology | Admitting: Nephrology

## 2021-10-08 VITALS — BP 170/51 | HR 61 | Temp 97.3°F | Resp 18

## 2021-10-08 DIAGNOSIS — Z862 Personal history of diseases of the blood and blood-forming organs and certain disorders involving the immune mechanism: Secondary | ICD-10-CM | POA: Insufficient documentation

## 2021-10-08 DIAGNOSIS — N184 Chronic kidney disease, stage 4 (severe): Secondary | ICD-10-CM | POA: Diagnosis not present

## 2021-10-08 DIAGNOSIS — N189 Chronic kidney disease, unspecified: Secondary | ICD-10-CM | POA: Insufficient documentation

## 2021-10-08 LAB — POCT HEMOGLOBIN-HEMACUE: Hemoglobin: 10.7 g/dL — ABNORMAL LOW (ref 13.0–17.0)

## 2021-10-08 MED ORDER — EPOETIN ALFA-EPBX 10000 UNIT/ML IJ SOLN
INTRAMUSCULAR | Status: AC
  Administered 2021-10-08: 20000 [IU] via SUBCUTANEOUS
  Filled 2021-10-08: qty 2

## 2021-10-08 MED ORDER — EPOETIN ALFA-EPBX 10000 UNIT/ML IJ SOLN: 20000.0000 [IU] | INTRAMUSCULAR | Status: DC

## 2021-11-04 DIAGNOSIS — I129 Hypertensive chronic kidney disease with stage 1 through stage 4 chronic kidney disease, or unspecified chronic kidney disease: Secondary | ICD-10-CM | POA: Diagnosis not present

## 2021-11-04 DIAGNOSIS — N184 Chronic kidney disease, stage 4 (severe): Secondary | ICD-10-CM | POA: Diagnosis not present

## 2021-11-04 DIAGNOSIS — E7800 Pure hypercholesterolemia, unspecified: Secondary | ICD-10-CM | POA: Diagnosis not present

## 2021-11-04 NOTE — Progress Notes (Shared)
[facility] Note  11/09/2021     CHIEF COMPLAINT Patient presents for Retina Follow Up    HISTORY OF PRESENT ILLNESS: Danny Rhodes is a 88 y.o. male who presents to the clinic today for:  HPI     Retina Follow Up   Patient presents with  Other.  In left eye.  Duration of 6 months.  Since onset it is stable.  I, the attending physician,  performed the HPI with the patient and updated documentation appropriately.        Comments   6 month follow up CNV OS-  Doing well, no new problems. Using Dorzolamide BID OU and Latanoprost qhs OU.      Last edited by Bernarda Caffey, MD on 11/11/2021  1:15 PM.    Pt states vision is doing well, no new health concerns, still using Dorzolamide BID OU and latanoprost QHS OU per Dr. Lucianne Lei  Referring physician: Lisabeth Pick, Md [place],  [place] [id number]  HISTORICAL INFORMATION:   Selected notes from the MEDICAL RECORD NUMBER Referred by Dr. Kathlen Mody LEE: 07.30.21 BCVA OD 20/40, OS 20/50 Ocular Hx- POAG OU severe, Cataracts OU, HTN Retinopathy OU, NVAMD OS, Band Keratopathy OU   CURRENT MEDICATIONS: Current Outpatient Medications (Ophthalmic Drugs)  Medication Sig   dorzolamide (TRUSOPT) 2 % ophthalmic solution 1 drop into affected eye   latanoprost (XALATAN) 0.005 % ophthalmic solution 1 drop into affected eye in the evening   No current facility-administered medications for this visit. (Ophthalmic Drugs)   Current Outpatient Medications (Other)  Medication Sig   acetaminophen (TYLENOL) 325 MG tablet Take 2 tablets (650 mg total) by mouth every 6 (six) hours as needed for up to 30 doses.   acetaminophen (TYLENOL) 500 MG tablet Take 1,000 mg by mouth 2 (two) times daily.   allopurinol (ZYLOPRIM) 100 MG tablet 1 tablet   amLODipine (NORVASC) 10 MG tablet Take 10 mg by mouth daily.   atorvastatin (LIPITOR) 20 MG tablet Take 20 mg by mouth daily at 6 PM.   carvedilol (COREG) 3.125 MG tablet 1  tablet with food   furosemide (LASIX) 20 MG tablet Take 20 mg by mouth daily.   ibuprofen (ADVIL) 400 MG tablet Take 1 tablet (400 mg total) by mouth every 6 (six) hours as needed for up to 10 doses.   metoprolol tartrate (LOPRESSOR) 50 MG tablet Take 50 mg by mouth 2 (two) times daily.   olmesartan-hydrochlorothiazide (BENICAR HCT) 40-25 MG tablet Take 1 tablet by mouth daily.   potassium chloride SA (KLOR-CON) 20 MEQ tablet potassium chloride ER 20 mEq tablet,extended release(part/cryst)   traMADol (ULTRAM) 50 MG tablet Take 1 tablet (50 mg total) by mouth at bedtime as needed for up to 6 doses for severe pain.   valsartan-hydrochlorothiazide (DIOVAN-HCT) 320-25 MG tablet Take 1 tablet by mouth daily.   vitamin C (ASCORBIC ACID) 500 MG tablet Take 500 mg by mouth daily.   amLODipine (NORVASC) 5 MG tablet Take 5 mg by mouth daily.   aspirin EC 81 MG tablet Take 81 mg by mouth daily. (Patient not taking: Reported on 11/09/2021)   atorvastatin (LIPITOR) 20 MG tablet Take 1 tablet by mouth daily. (Patient not taking: Reported on 05/07/2021)   No current facility-administered medications for this visit. (Other)   REVIEW OF SYSTEMS: ROS   Positive for: Genitourinary, Eyes Negative for: Constitutional, Gastrointestinal, Neurological, Skin, Musculoskeletal, HENT, Endocrine, Cardiovascular, Respiratory, Psychiatric, Allergic/Imm, Heme/Lymph Last edited by Nyra Capes,  [place], COA on 11/09/2021  8:57 AM.     ALLERGIES No Known Allergies  PAST MEDICAL HISTORY Past Medical History:  Diagnosis Date   CKD (chronic kidney disease), symptom management only, stage 4 (severe) (HCC)    Glaucoma    POAG OU   History of anemia due to chronic kidney disease    Hypertensive retinopathy    OU   Past Surgical History:  Procedure Laterality Date   CATARACT EXTRACTION Bilateral    EYE SURGERY Bilateral    Cat Sx   FAMILY HISTORY History reviewed. No pertinent family history.  SOCIAL HISTORY Social History    Tobacco Use   Smoking status: Never   Smokeless tobacco: Never       OPHTHALMIC EXAM:  Base Eye Exam     Visual Acuity (Snellen - Linear)       Right Left   Dist [place] 20/25 -1 20/30 -2   Dist ph [place] NI NI         Tonometry (Tonopen, 9:05 AM)       Right Left   Pressure 11 8         Pupils       Dark Light Shape React APD   Right 2 1 Round Minimal None   Left 2 1 Round Minimal None         Visual Fields (Counting fingers)       Left Right    Full Full         Extraocular Movement       Right Left    Full Full         Neuro/Psych     Oriented x3: Yes   Mood/Affect: Normal         Dilation     Both eyes: 1.0% Mydriacyl, 2.5% Phenylephrine @ 9:05 AM           Slit Lamp and Fundus Exam     Slit Lamp Exam       Right Left   Lids/Lashes Dermatochalasis - upper lid Dermatochalasis - upper lid   Conjunctiva/Sclera mild Melanosis Mild melanosis, temporal pinguecula   Cornea arcus, 1-2+ Punctate epithelial erosions, well healed cataract wound, early bank K, mild central haze arcus, trace Punctate epithelial erosions, early band K nasally, well healed temporal cataract wounds, mild central haze, tear film debris   Anterior Chamber deep and clear, narrow temporal angle deep and narrow temporal angle   Iris Round and dilated Round and dilated   Lens PCIOL PC IOL in good position   Anterior Vitreous Vitreous syneresis Vitreous syneresis         Fundus Exam       Right Left   Disc 2+Pallor, +cupping, +PPA/PPP, thin superior rim, Sharp rim 2+Pallor, +cupping, scattered PPP, thin inferior rim   C/D Ratio 0.7 0.75   Macula Flat, Blunted foveal reflex, RPE mottling, clumping and atrophy, Drusen, No heme or edema Blunted foveal reflex, focal pigment ring/CNV with stable improvement in shallow SRF IT to fovea, RPE mottling, clumping and atrophy, no heme   Vessels attenuated, Tortuous, AV crossing changes focal perivascular pigment clumping  temporally, attenuated, Tortuous   Periphery Attached, No heme, reticular degeneration Attached, focal perivascular pigment clumping temporally, No heme           IMAGING AND PROCEDURES  Imaging and Procedures for 11/09/2021  OCT, Retina - OU - Both Eyes       Right Eye Quality was good. Central Foveal Thickness: 222.  Progression has been stable. Findings include normal foveal contour, no IRF, no SRF, retinal drusen (Trace ERM; partial PVD).   Left Eye Quality was good. Central Foveal Thickness: 208. Progression has been stable. Findings include pigment epithelial detachment, normal foveal contour, no IRF, no SRF, outer retinal atrophy, retinal drusen , subretinal hyper-reflective material (stable resolution of IRF/SRF overlying PED).   Notes *Images captured and stored on drive  Diagnosis / Impression:  OD: NFP, no IRF/SRF OS: stable resolution of IRF/SRF overlying PED  Clinical management:  See below  Abbreviations: NFP - Normal foveal profile. CME - cystoid macular edema. PED - pigment epithelial detachment. IRF - intraretinal fluid. SRF - subretinal fluid. EZ - ellipsoid zone. ERM - epiretinal membrane. ORA - outer retinal atrophy. ORT - outer retinal tubulation. SRHM - subretinal hyper-reflective material. IRHM - intraretinal hyper-reflective material           ASSESSMENT/PLAN:    ICD-10-CM   1. Choroidal neovascular membrane of left eye  H35.052 OCT, Retina - OU - Both Eyes    2. Exudative age-related macular degeneration of left eye with active choroidal neovascularization ([place])  H35.3221     3. Essential hypertension  I10     4. Hypertensive retinopathy of both eyes  H35.033     5. Pseudophakia, both eyes  Z96.1     6. Primary open angle glaucoma (POAG) of both eyes, severe stage  H40.1133      1,2. Choroidal neovascular membrane w/ +SRF  - idiopathic CNV vs Exudative age related macular degeneration, OS  - interestingly, OD without drusen and OS with  minimal drusen, which we would see in typical ARMD   - s/p IVA OS #1 (08.04.21), #2 (09.02.21), #3 (10.05.21), #4 (11.11.21), #5 (12.23.21), #6 (2.3.22), #7 (03.31.22), #8 (06.09.22)  - BCVA OS 20/25  - OCT shows stable resolution of IRF/SRF overlying PED   - recommend holding IVA OS again today -- pt in agreement  - f/u in 9-12 months, sooner -- DFE/OCT  3,4. Hypertensive retinopathy OU - discussed importance of tight BP control - monitor  5. Pseudophakia OU  - s/p CE/IOL (Weaver, OD 8.11.21; OS: 09.22.21)  - IOL's in good position, doing well  6. POAG OU  - under the expert management of Dr. Lucianne Lei  - IOP 11,08 today  - on dorzolamide bid OU and latanoprost qhs OU per Dr. Lucianne Lei  Ophthalmic Meds Ordered this visit:  No orders of the defined types were placed in this encounter.    Return for f/u 9-12 months, CNV OS, DFE, OCT.  There are no Patient Instructions on file for this visit.   Explained the diagnoses, plan, and follow up with the patient and they expressed understanding.  Patient expressed understanding of the importance of proper follow up care.   This document serves as a record of services personally performed by Gardiner Sleeper, MD, PhD. It was created on their behalf by Roselee Nova, COM[place]. The creation of this record is the provider's dictation and/or activities during the visit.  Electronically signed by: Roselee Nova, COM[place] 11/11/21 1:18 PM  This document serves as a record of services personally performed by Gardiner Sleeper, MD, PhD. It was created on their behalf by San Jetty. Owens Shark, OA an ophthalmic technician. The creation of this record is the provider's dictation and/or activities during the visit.    Electronically signed by: San Jetty. Owens Shark, [place] 06.05.2023 1:18 PM  Gardiner Sleeper, M.D., Ph.D. Diseases & Surgery of the  Retina and Vitreous [facility] [place]  I have reviewed the above documentation for accuracy and completeness, and I agree  with the above. Gardiner Sleeper, M.D., Ph.D. 11/11/21 1:18 PM  Abbreviations: M myopia (nearsighted); A astigmatism; H hyperopia (farsighted); P presbyopia; Mrx spectacle prescription;  CTL contact lenses; OD right eye; OS left eye; OU both eyes  XT exotropia; ET esotropia; PEK punctate epithelial keratitis; PEE punctate epithelial erosions; DES dry eye syndrome; MGD meibomian gland dysfunction; ATs artificial tears; PFAT's preservative free artificial tears; [place] nuclear sclerotic cataract; PSC posterior subcapsular cataract; ERM epi-retinal membrane; PVD posterior vitreous detachment; RD retinal detachment; DM diabetes mellitus; DR diabetic retinopathy; NPDR non-proliferative diabetic retinopathy; PDR proliferative diabetic retinopathy; CSME clinically significant macular edema; DME diabetic macular edema; dbh dot blot hemorrhages; CWS cotton wool spot; POAG primary open angle glaucoma; C/D cup-to-disc ratio; HVF humphrey visual field; GVF goldmann visual field; OCT optical coherence tomography; IOP intraocular pressure; BRVO Branch retinal vein occlusion; CRVO central retinal vein occlusion; CRAO central retinal artery occlusion; BRAO branch retinal artery occlusion; RT retinal tear; SB scleral buckle; PPV pars plana vitrectomy; VH Vitreous hemorrhage; PRP panretinal laser photocoagulation; IVK intravitreal kenalog; VMT vitreomacular traction; MH Macular hole;  NVD neovascularization of the disc; NVE neovascularization elsewhere; AREDS age related eye disease study; ARMD age related macular degeneration; POAG primary open angle glaucoma; EBMD epithelial/anterior basement membrane dystrophy; ACIOL anterior chamber intraocular lens; IOL intraocular lens; PCIOL posterior chamber intraocular lens; Phaco/IOL phacoemulsification with intraocular lens placement; [place] photorefractive keratectomy; LASIK laser assisted in situ keratomileusis; HTN hypertension; DM diabetes mellitus; COPD chronic obstructive pulmonary  disease

## 2021-11-05 ENCOUNTER — Encounter (HOSPITAL_COMMUNITY)
Admission: RE | Admit: 2021-11-05 | Discharge: 2021-11-05 | Disposition: A | Discharge status: Home / Self Care | Payer: Medicare Other | Source: Ambulatory Visit | Attending: Nephrology | Admitting: Nephrology

## 2021-11-05 ENCOUNTER — Encounter (INDEPENDENT_AMBULATORY_CARE_PROVIDER_SITE_OTHER): Payer: Medicare Other | Admitting: Ophthalmology

## 2021-11-05 VITALS — BP 180/59 | HR 65 | Temp 97.6°F | Resp 18

## 2021-11-05 DIAGNOSIS — N189 Chronic kidney disease, unspecified: Secondary | ICD-10-CM | POA: Insufficient documentation

## 2021-11-05 DIAGNOSIS — Z862 Personal history of diseases of the blood and blood-forming organs and certain disorders involving the immune mechanism: Secondary | ICD-10-CM | POA: Insufficient documentation

## 2021-11-05 DIAGNOSIS — N184 Chronic kidney disease, stage 4 (severe): Secondary | ICD-10-CM | POA: Diagnosis not present

## 2021-11-05 LAB — IRON AND TIBC
Iron: 50 ug/dL (ref 45–182)
Saturation Ratios: 24 % (ref 17.9–39.5)
TIBC: 211 ug/dL — ABNORMAL LOW (ref 250–450)
UIBC: 161 ug/dL

## 2021-11-05 LAB — RENAL FUNCTION PANEL
Albumin: 3.8 g/dL (ref 3.5–5.0)
Anion gap: 7 (ref 5–15)
BUN: 44 mg/dL — ABNORMAL HIGH (ref 8–23)
CO2: 24 mmol/L (ref 22–32)
Calcium: 10 mg/dL (ref 8.9–10.3)
Chloride: 107 mmol/L (ref 98–111)
Creatinine, Ser: 2.49 mg/dL — ABNORMAL HIGH (ref 0.61–1.24)
GFR, Estimated: 24 mL/min — ABNORMAL LOW (ref >60)
Glucose, Bld: 97 mg/dL (ref 70–99)
Phosphorus: 3.8 mg/dL (ref 2.5–4.6)
Potassium: 3.4 mmol/L — ABNORMAL LOW (ref 3.5–5.1)
Sodium: 138 mmol/L (ref 135–145)

## 2021-11-05 LAB — POCT HEMOGLOBIN-HEMACUE: Hemoglobin: 10.7 g/dL — ABNORMAL LOW (ref 13.0–17.0)

## 2021-11-05 LAB — FERRITIN: Ferritin: 580 ng/mL — ABNORMAL HIGH (ref 24–336)

## 2021-11-05 MED ORDER — EPOETIN ALFA-EPBX 10000 UNIT/ML IJ SOLN
INTRAMUSCULAR | Status: AC
  Administered 2021-11-05: 20000 [IU] via SUBCUTANEOUS
  Filled 2021-11-05: qty 2

## 2021-11-05 MED ORDER — EPOETIN ALFA-EPBX 10000 UNIT/ML IJ SOLN: 20000.0000 [IU] | INTRAMUSCULAR | Status: DC

## 2021-11-09 ENCOUNTER — Ambulatory Visit (INDEPENDENT_AMBULATORY_CARE_PROVIDER_SITE_OTHER): Payer: Medicare Other | Admitting: Ophthalmology

## 2021-11-09 ENCOUNTER — Encounter (INDEPENDENT_AMBULATORY_CARE_PROVIDER_SITE_OTHER): Payer: Self-pay | Admitting: Ophthalmology

## 2021-11-09 DIAGNOSIS — H401133 Primary open-angle glaucoma, bilateral, severe stage: Secondary | ICD-10-CM | POA: Diagnosis not present

## 2021-11-09 DIAGNOSIS — H35052 Retinal neovascularization, unspecified, left eye: Secondary | ICD-10-CM

## 2021-11-09 DIAGNOSIS — H35033 Hypertensive retinopathy, bilateral: Secondary | ICD-10-CM

## 2021-11-09 DIAGNOSIS — I10 Essential (primary) hypertension: Secondary | ICD-10-CM

## 2021-11-09 DIAGNOSIS — H353221 Exudative age-related macular degeneration, left eye, with active choroidal neovascularization: Secondary | ICD-10-CM | POA: Diagnosis not present

## 2021-11-09 DIAGNOSIS — Z961 Presence of intraocular lens: Secondary | ICD-10-CM

## 2021-11-11 ENCOUNTER — Encounter (INDEPENDENT_AMBULATORY_CARE_PROVIDER_SITE_OTHER): Payer: Self-pay | Admitting: Ophthalmology

## 2021-11-20 DIAGNOSIS — M25551 Pain in right hip: Secondary | ICD-10-CM | POA: Diagnosis not present

## 2021-11-25 ENCOUNTER — Inpatient Hospital Stay (HOSPITAL_COMMUNITY)
Admission: EM | Admit: 2021-11-25 | Discharge: 2021-12-05 | DRG: 463 | Disposition: A | Discharge status: Home Health | Payer: Medicare Other | Attending: Internal Medicine | Admitting: Internal Medicine

## 2021-11-25 ENCOUNTER — Emergency Department (HOSPITAL_COMMUNITY): Payer: Medicare Other

## 2021-11-25 ENCOUNTER — Encounter (HOSPITAL_COMMUNITY): Payer: Self-pay

## 2021-11-25 ENCOUNTER — Other Ambulatory Visit: Payer: Self-pay

## 2021-11-25 DIAGNOSIS — R1900 Intra-abdominal and pelvic swelling, mass and lump, unspecified site: Secondary | ICD-10-CM | POA: Diagnosis not present

## 2021-11-25 DIAGNOSIS — D638 Anemia in other chronic diseases classified elsewhere: Secondary | ICD-10-CM | POA: Diagnosis not present

## 2021-11-25 DIAGNOSIS — I129 Hypertensive chronic kidney disease with stage 1 through stage 4 chronic kidney disease, or unspecified chronic kidney disease: Secondary | ICD-10-CM | POA: Diagnosis present

## 2021-11-25 DIAGNOSIS — I10 Essential (primary) hypertension: Secondary | ICD-10-CM

## 2021-11-25 DIAGNOSIS — N189 Chronic kidney disease, unspecified: Secondary | ICD-10-CM | POA: Diagnosis not present

## 2021-11-25 DIAGNOSIS — Z23 Encounter for immunization: Secondary | ICD-10-CM

## 2021-11-25 DIAGNOSIS — T8451XA Infection and inflammatory reaction due to internal right hip prosthesis, initial encounter: Secondary | ICD-10-CM | POA: Diagnosis not present

## 2021-11-25 DIAGNOSIS — T84090A Other mechanical complication of internal right hip prosthesis, initial encounter: Secondary | ICD-10-CM | POA: Diagnosis not present

## 2021-11-25 DIAGNOSIS — E785 Hyperlipidemia, unspecified: Secondary | ICD-10-CM | POA: Diagnosis present

## 2021-11-25 DIAGNOSIS — B955 Unspecified streptococcus as the cause of diseases classified elsewhere: Secondary | ICD-10-CM | POA: Diagnosis not present

## 2021-11-25 DIAGNOSIS — E7800 Pure hypercholesterolemia, unspecified: Secondary | ICD-10-CM | POA: Diagnosis not present

## 2021-11-25 DIAGNOSIS — B954 Other streptococcus as the cause of diseases classified elsewhere: Secondary | ICD-10-CM | POA: Diagnosis present

## 2021-11-25 DIAGNOSIS — T8452XA Infection and inflammatory reaction due to internal left hip prosthesis, initial encounter: Secondary | ICD-10-CM | POA: Diagnosis not present

## 2021-11-25 DIAGNOSIS — Z79899 Other long term (current) drug therapy: Secondary | ICD-10-CM

## 2021-11-25 DIAGNOSIS — Y831 Surgical operation with implant of artificial internal device as the cause of abnormal reaction of the patient, or of later complication, without mention of misadventure at the time of the procedure: Secondary | ICD-10-CM | POA: Diagnosis present

## 2021-11-25 DIAGNOSIS — R627 Adult failure to thrive: Secondary | ICD-10-CM | POA: Diagnosis not present

## 2021-11-25 DIAGNOSIS — D72829 Elevated white blood cell count, unspecified: Secondary | ICD-10-CM

## 2021-11-25 DIAGNOSIS — G8929 Other chronic pain: Secondary | ICD-10-CM | POA: Diagnosis present

## 2021-11-25 DIAGNOSIS — Z6825 Body mass index (BMI) 25.0-25.9, adult: Secondary | ICD-10-CM

## 2021-11-25 DIAGNOSIS — K224 Dyskinesia of esophagus: Secondary | ICD-10-CM | POA: Diagnosis not present

## 2021-11-25 DIAGNOSIS — N184 Chronic kidney disease, stage 4 (severe): Secondary | ICD-10-CM | POA: Diagnosis not present

## 2021-11-25 DIAGNOSIS — K4090 Unilateral inguinal hernia, without obstruction or gangrene, not specified as recurrent: Secondary | ICD-10-CM | POA: Diagnosis not present

## 2021-11-25 DIAGNOSIS — R000 Tachycardia, unspecified: Secondary | ICD-10-CM | POA: Diagnosis not present

## 2021-11-25 DIAGNOSIS — K219 Gastro-esophageal reflux disease without esophagitis: Secondary | ICD-10-CM | POA: Diagnosis not present

## 2021-11-25 DIAGNOSIS — S73004A Unspecified dislocation of right hip, initial encounter: Secondary | ICD-10-CM | POA: Diagnosis not present

## 2021-11-25 DIAGNOSIS — Z515 Encounter for palliative care: Secondary | ICD-10-CM | POA: Diagnosis not present

## 2021-11-25 DIAGNOSIS — R7401 Elevation of levels of liver transaminase levels: Secondary | ICD-10-CM | POA: Diagnosis not present

## 2021-11-25 DIAGNOSIS — T8484XA Pain due to internal orthopedic prosthetic devices, implants and grafts, initial encounter: Secondary | ICD-10-CM | POA: Diagnosis not present

## 2021-11-25 DIAGNOSIS — Z66 Do not resuscitate: Secondary | ICD-10-CM | POA: Diagnosis present

## 2021-11-25 DIAGNOSIS — E663 Overweight: Secondary | ICD-10-CM | POA: Diagnosis present

## 2021-11-25 DIAGNOSIS — T84020A Dislocation of internal right hip prosthesis, initial encounter: Secondary | ICD-10-CM | POA: Diagnosis not present

## 2021-11-25 DIAGNOSIS — D631 Anemia in chronic kidney disease: Secondary | ICD-10-CM | POA: Diagnosis present

## 2021-11-25 DIAGNOSIS — M109 Gout, unspecified: Secondary | ICD-10-CM

## 2021-11-25 DIAGNOSIS — Z96651 Presence of right artificial knee joint: Secondary | ICD-10-CM | POA: Diagnosis not present

## 2021-11-25 DIAGNOSIS — K449 Diaphragmatic hernia without obstruction or gangrene: Secondary | ICD-10-CM | POA: Diagnosis not present

## 2021-11-25 DIAGNOSIS — Z89621 Acquired absence of right hip joint: Secondary | ICD-10-CM

## 2021-11-25 DIAGNOSIS — N289 Disorder of kidney and ureter, unspecified: Secondary | ICD-10-CM | POA: Diagnosis not present

## 2021-11-25 DIAGNOSIS — H401130 Primary open-angle glaucoma, bilateral, stage unspecified: Secondary | ICD-10-CM | POA: Diagnosis present

## 2021-11-25 DIAGNOSIS — Z79891 Long term (current) use of opiate analgesic: Secondary | ICD-10-CM

## 2021-11-25 DIAGNOSIS — E43 Unspecified severe protein-calorie malnutrition: Secondary | ICD-10-CM | POA: Diagnosis not present

## 2021-11-25 DIAGNOSIS — Y792 Prosthetic and other implants, materials and accessory orthopedic devices associated with adverse incidents: Secondary | ICD-10-CM | POA: Diagnosis present

## 2021-11-25 DIAGNOSIS — R1310 Dysphagia, unspecified: Secondary | ICD-10-CM

## 2021-11-25 DIAGNOSIS — Z20822 Contact with and (suspected) exposure to covid-19: Secondary | ICD-10-CM | POA: Diagnosis present

## 2021-11-25 DIAGNOSIS — N179 Acute kidney failure, unspecified: Secondary | ICD-10-CM

## 2021-11-25 DIAGNOSIS — Z471 Aftercare following joint replacement surgery: Secondary | ICD-10-CM | POA: Diagnosis not present

## 2021-11-25 DIAGNOSIS — Z96641 Presence of right artificial hip joint: Secondary | ICD-10-CM | POA: Diagnosis not present

## 2021-11-25 DIAGNOSIS — Z862 Personal history of diseases of the blood and blood-forming organs and certain disorders involving the immune mechanism: Secondary | ICD-10-CM

## 2021-11-25 DIAGNOSIS — M009 Pyogenic arthritis, unspecified: Secondary | ICD-10-CM | POA: Diagnosis not present

## 2021-11-25 DIAGNOSIS — R6889 Other general symptoms and signs: Secondary | ICD-10-CM | POA: Diagnosis not present

## 2021-11-25 DIAGNOSIS — Z01818 Encounter for other preprocedural examination: Secondary | ICD-10-CM | POA: Diagnosis not present

## 2021-11-25 DIAGNOSIS — B999 Unspecified infectious disease: Secondary | ICD-10-CM

## 2021-11-25 LAB — URINALYSIS, ROUTINE W REFLEX MICROSCOPIC
Bilirubin Urine: NEGATIVE
Glucose, UA: NEGATIVE mg/dL
Hgb urine dipstick: NEGATIVE
Ketones, ur: NEGATIVE mg/dL
Leukocytes,Ua: NEGATIVE
Nitrite: NEGATIVE
Protein, ur: NEGATIVE mg/dL
Specific Gravity, Urine: 1.009 (ref 1.005–1.030)
pH: 6 (ref 5.0–8.0)

## 2021-11-25 LAB — COMPREHENSIVE METABOLIC PANEL
ALT: 81 U/L — ABNORMAL HIGH (ref 0–44)
AST: 73 U/L — ABNORMAL HIGH (ref 15–41)
Albumin: 2.7 g/dL — ABNORMAL LOW (ref 3.5–5.0)
Alkaline Phosphatase: 185 U/L — ABNORMAL HIGH (ref 38–126)
Anion gap: 12 (ref 5–15)
BUN: 94 mg/dL — ABNORMAL HIGH (ref 8–23)
CO2: 22 mmol/L (ref 22–32)
Calcium: 10.3 mg/dL (ref 8.9–10.3)
Chloride: 101 mmol/L (ref 98–111)
Creatinine, Ser: 4.06 mg/dL — ABNORMAL HIGH (ref 0.61–1.24)
GFR, Estimated: 13 mL/min — ABNORMAL LOW (ref >60)
Glucose, Bld: 124 mg/dL — ABNORMAL HIGH (ref 70–99)
Potassium: 4.2 mmol/L (ref 3.5–5.1)
Sodium: 135 mmol/L (ref 135–145)
Total Bilirubin: 0.8 mg/dL (ref 0.3–1.2)
Total Protein: 7.9 g/dL (ref 6.5–8.1)

## 2021-11-25 LAB — CBC WITH DIFFERENTIAL/PLATELET
Abs Immature Granulocytes: 0.12 10*3/uL — ABNORMAL HIGH (ref 0.00–0.07)
Basophils Absolute: 0 10*3/uL (ref 0.0–0.1)
Basophils Relative: 0 %
Eosinophils Absolute: 0 10*3/uL (ref 0.0–0.5)
Eosinophils Relative: 0 %
HCT: 33.4 % — ABNORMAL LOW (ref 39.0–52.0)
Hemoglobin: 10.2 g/dL — ABNORMAL LOW (ref 13.0–17.0)
Immature Granulocytes: 1 %
Lymphocytes Relative: 4 %
Lymphs Abs: 0.7 10*3/uL (ref 0.7–4.0)
MCH: 25.7 pg — ABNORMAL LOW (ref 26.0–34.0)
MCHC: 30.5 g/dL (ref 30.0–36.0)
MCV: 84.1 fL (ref 80.0–100.0)
Monocytes Absolute: 1.2 10*3/uL — ABNORMAL HIGH (ref 0.1–1.0)
Monocytes Relative: 7 %
Neutro Abs: 15 10*3/uL — ABNORMAL HIGH (ref 1.7–7.7)
Neutrophils Relative %: 88 %
Platelets: 705 10*3/uL — ABNORMAL HIGH (ref 150–400)
RBC: 3.97 MIL/uL — ABNORMAL LOW (ref 4.22–5.81)
RDW: 17.1 % — ABNORMAL HIGH (ref 11.5–15.5)
WBC: 17 10*3/uL — ABNORMAL HIGH (ref 4.0–10.5)
nRBC: 0 % (ref 0.0–0.2)

## 2021-11-25 LAB — PROTIME-INR
INR: 1.3 — ABNORMAL HIGH (ref 0.8–1.2)
Prothrombin Time: 16 seconds — ABNORMAL HIGH (ref 11.4–15.2)

## 2021-11-25 LAB — SODIUM, URINE, RANDOM: Sodium, Ur: 63 mmol/L

## 2021-11-25 LAB — SARS CORONAVIRUS 2 BY RT PCR: SARS Coronavirus 2 by RT PCR: NEGATIVE

## 2021-11-25 LAB — CREATININE, URINE, RANDOM: Creatinine, Urine: 46.09 mg/dL

## 2021-11-25 MED ORDER — ACETAMINOPHEN 500 MG PO TABS
1000.0000 mg | ORAL_TABLET | Freq: Once | ORAL | Status: AC
  Administered 2021-11-25: 1000 mg via ORAL
  Filled 2021-11-25: qty 2

## 2021-11-25 MED ORDER — ALLOPURINOL 100 MG PO TABS
100.0000 mg | ORAL_TABLET | Freq: Every morning | ORAL | Status: DC
  Administered 2021-11-27 – 2021-12-05 (×9): 100 mg via ORAL
  Filled 2021-11-25 (×9): qty 1

## 2021-11-25 MED ORDER — SODIUM CHLORIDE 0.9 % IV BOLUS
1000.0000 mL | Freq: Once | INTRAVENOUS | Status: AC
  Administered 2021-11-25: 1000 mL via INTRAVENOUS

## 2021-11-25 MED ORDER — HYDROCODONE-ACETAMINOPHEN 5-325 MG PO TABS: 1.0000 | ORAL_TABLET | Freq: Four times a day (QID) | ORAL | Status: DC | PRN

## 2021-11-25 MED ORDER — CARVEDILOL 3.125 MG PO TABS
3.1250 mg | ORAL_TABLET | Freq: Two times a day (BID) | ORAL | Status: DC
  Administered 2021-11-25 – 2021-12-05 (×17): 3.125 mg via ORAL
  Filled 2021-11-25 (×17): qty 1

## 2021-11-25 MED ORDER — AMLODIPINE BESYLATE 10 MG PO TABS
10.0000 mg | ORAL_TABLET | Freq: Every morning | ORAL | Status: DC
  Administered 2021-11-27 – 2021-12-02 (×6): 10 mg via ORAL
  Filled 2021-11-25 (×6): qty 1

## 2021-11-25 MED ORDER — MORPHINE SULFATE (PF) 2 MG/ML IV SOLN: 0.5000 mg | INTRAVENOUS | Status: DC | PRN

## 2021-11-25 MED ORDER — SODIUM CHLORIDE 0.9 % IV SOLN: INTRAVENOUS | Status: AC

## 2021-11-25 MED ORDER — HYDROMORPHONE HCL 1 MG/ML IJ SOLN
0.5000 mg | INTRAMUSCULAR | Status: DC | PRN
  Administered 2021-11-25 – 2021-11-26 (×2): 0.5 mg via INTRAVENOUS
  Filled 2021-11-25: qty 1
  Filled 2021-11-25: qty 0.5

## 2021-11-25 MED ORDER — LATANOPROST 0.005 % OP SOLN
1.0000 [drp] | Freq: Every day | OPHTHALMIC | Status: DC
Start: 2021-11-25 — End: 2021-12-05
  Administered 2021-11-26 – 2021-12-04 (×10): 1 [drp] via OPHTHALMIC
  Filled 2021-11-25 (×2): qty 2.5

## 2021-11-25 MED ORDER — OXYCODONE HCL 5 MG PO TABS
5.0000 mg | ORAL_TABLET | Freq: Once | ORAL | Status: AC
  Administered 2021-11-25: 5 mg via ORAL
  Filled 2021-11-25: qty 1

## 2021-11-25 MED ORDER — DORZOLAMIDE HCL 2 % OP SOLN
1.0000 [drp] | Freq: Two times a day (BID) | OPHTHALMIC | Status: DC
Start: 2021-11-25 — End: 2021-12-05
  Administered 2021-11-26 – 2021-12-05 (×19): 1 [drp] via OPHTHALMIC
  Filled 2021-11-25 (×2): qty 10

## 2021-11-25 NOTE — Assessment & Plan Note (Signed)
Well controlled Continue daily norvasc and coreg

## 2021-11-25 NOTE — Assessment & Plan Note (Addendum)
Likely due to prerenal azotemia secondary to FTT with poor PO intake over the past 2 weeks, no hx of exogenous losses or urinary complaints Strict I/O Urinalysis unremarkable. -Urine sodium 63, urine creatinine 46.09 Bladder scan with no urine  -Baseline creatinine approximately 2.3-2.5. -Renal function slowly trending down with creatinine currently at baseline at 2.48 from 2.50 from 2.42 from 2.71 from 2.95 from 4.06 on admission. -Urine output recorded of 800 cc over the past 24 hours. -IV fluids have been saline locked. -Preop IV fluids ordered per orthopedics. Hold/avoid nephrotoxic drugs. Palliative care consulted and following.

## 2021-11-25 NOTE — ED Triage Notes (Signed)
Pt BIB [facility] for loss off appetite and chronic hip pain.  Per family pt has had anything to eat in 2 weeks.

## 2021-11-25 NOTE — H&P (Signed)
History and Physical    Patient: Danny Rhodes MQK:[id number] DOB: 1926-08-29 DOA: 11/25/2021 DOS: the patient was seen and examined on 11/25/2021 PCP: [name], MD  Patient coming from: Home - lives with his wife and a daughter. Uses walker   Chief Complaint: right hip pain   HPI: Danny Rhodes is a 88 y.o. male with medical history significant of CKD stage 4, ACD, HTN, HLD who presented to ED with complaints of right hip pain and FTT over the past few weeks.   He started to have pain in his right hip about 5 years ago. He saw his orthopedic, Dr. Alvan Dame, who stated pin in hip was loose, but did not recommend surgery. He started to use a cane about 1.5 years ago and then a walker about one year ago. On June 8th he woke up in severe pain with chills and not feeling well with no appetite. He states pain was in right hip, no radiation. Rated as a 9/10 and desribed as sharp. He denies any falls or trauma. Tylenol didn't help the pain. Tramadol eased it, but didn't take the pain away. He has not really ate or drank much over the past 2 weeks and continued to have the pain in his right hip. Family decided he was so weak and the pain was so bad they brought him to the ED.   He also has complaints of dysphagia of solid foods on 6/8 as well. No issues with liquids, grapefruit or oranges, but states other food just won't go down. Feels like it gets stuck. He has no problems with pills. NO coughing or choking post swallowing.   He has not had any NSAIDs. Denies any difficulty urinating or change in color of urine.   Denies any fever/chills, vision changes/headaches, chest pain or palpitations, shortness of breath or cough, abdominal pain, N/V/D, dysuria or leg swelling from baseline. He has lost weight over the past few weeks.     He does not smoke or drink alcohol.   ER Course:  vitals: afebrile, bp: 148/51, HR: 64, RR: 15, oxygen: 97% RA Pertinent labs: wbc: 17, hgb: 10.2, platelets: 705, BUN: 94,  creatinine: 4.06, AST: 73, ALT: 81, alk phos: 185, albumin: 2.7,  CXR: no acute process  Right hip/pelvis xray: . Prior right total hip replacement with superolateral dislocation of the femoral hardware component 2. Periimplant lucency involving the in the greater trochanter, finding could be due to age-indeterminate hardware loosening. 3. Acetabular cup position appears medially rotated, although evaluation is limited due to lack of prior imaging.  In ED: ortho consult, SW consult. Given 1L NS bolus, oxycodone and tylenol in ED.    Review of Systems: As mentioned in the history of present illness. All other systems reviewed and are negative. Past Medical History:  Diagnosis Date   CKD (chronic kidney disease), symptom management only, stage 4 (severe) (HCC)    Glaucoma    POAG OU   History of anemia due to chronic kidney disease    Hypertensive retinopathy    OU   Past Surgical History:  Procedure Laterality Date   CATARACT EXTRACTION Bilateral    EYE SURGERY Bilateral    Cat Sx   Social History:  reports that he has never smoked. He has never used smokeless tobacco. No history on file for alcohol use and drug use.  No Known Allergies  History reviewed. No pertinent family history.  Prior to Admission medications   Medication Sig Start Date End  Date Taking? Authorizing Provider  acetaminophen (TYLENOL) 500 MG tablet Take 1,000 mg by mouth 2 (two) times daily as needed for headache (pain).   Yes [provider]  allopurinol (ZYLOPRIM) 100 MG tablet Take 100 mg by mouth every morning.   Yes [provider]  amLODipine (NORVASC) 10 MG tablet Take 10 mg by mouth every morning.   Yes [provider]  atorvastatin (LIPITOR) 20 MG tablet Take 20 mg by mouth every morning. 09/26/17  Yes [provider]  carvedilol (COREG) 3.125 MG tablet Take 3.125 mg by mouth 2 (two) times daily with a meal.   Yes [provider]  dorzolamide (TRUSOPT) 2  % ophthalmic solution Place 1 drop into both eyes 2 (two) times daily.   Yes [provider]  furosemide (LASIX) 40 MG tablet Take 40-60 mg by mouth See admin instructions. Take 1 1/2 tablets (60 mg) by mouth every morning and 1 tablet (40 mg) every evening 09/24/21  Yes [provider]  latanoprost (XALATAN) 0.005 % ophthalmic solution Place 1 drop into both eyes at bedtime.   Yes [provider]  traMADol (ULTRAM) 50 MG tablet Take 1 tablet (50 mg total) by mouth at bedtime as needed for up to 6 doses for severe pain. Patient taking differently: Take 100 mg by mouth 2 (two) times daily. 11/16/19  Yes Wyvonnia Dusky, MD    Physical Exam: Vitals:   11/25/21 0948 11/25/21 0953 11/25/21 1345 11/25/21 1450  BP:  (!) 148/51 (!) 145/56 129/60  Pulse:  64 67 70  Resp:  '15 16 15  ' Temp:  98.5 F (36.9 C)    SpO2: 97% 97% 100% 100%  Weight:      Height:       General:  Appears calm and comfortable and is in NAD Eyes:  PERRL, EOMI, normal lids, iris ENT:  grossly normal hearing, lips & tongue, mmm; appropriate dentition Neck:  no LAD, masses or thyromegaly; no carotid bruits Cardiovascular:  RRR, no m/r/g. +pitting edema bilateral LE  Respiratory:   CTA bilaterally with no wheezes/rales/rhonchi.  Normal respiratory effort. Abdomen:  soft, NT, ND, NABS Back:   normal alignment, no CVAT Skin:  no rash or induration seen on limited exam Musculoskeletal:  grossly normal tone BUE. RLE: shortened and slightly abducted. Sensation intact. Good movement in foot. good ROM, no bony abnormality. TTP over right hip. Pedal pulses faint, but intact.  Lower extremity:    Limited foot exam with no ulcerations.  2+ distal pulses. Psychiatric:  grossly normal mood and affect, speech fluent and appropriate, AOx3 Neurologic:  CN 2-12 grossly intact, moves all extremities in coordinated fashion, sensation intact   Radiological Exams on Admission: Independently reviewed - see discussion  in A/P where applicable  No results found.  EKG: Independently reviewed.  NSR with rate 64. 1st degree AV block; nonspecific ST changes with no evidence of acute ischemia   Labs on Admission: I have personally reviewed the available labs and imaging studies at the time of the admission.  Pertinent labs:   wbc: 17,  hgb: 10.2,  platelets: 705,  BUN: 94,  creatinine: 4.06,  AST: 73,  ALT: 81,  alk phos: 185,  albumin: 2.7,   Assessment and Plan: Principal Problem:   Dislocation of internal right hip prosthesis, initial encounter ([place]) Active Problems:   Acute renal failure superimposed on stage 4 chronic kidney disease (HCC)   FTT (failure to thrive) in adult   Dysphagia  Leukocytosis   Transaminitis   Essential hypertension   History of anemia due to chronic kidney disease   Gout    Assessment and Plan: * Dislocation of internal right hip prosthesis, initial encounter [facility]) 88 year old male with history of right hip prosthetic with worsening pain over the past 5 years with increased pain over the past 2 weeks found to have dislocation of his prosthesis and has likely outlived it -place on med surg -ortho consulted and Dr. Alvan Dame will visit with patient about surgery -ortho has ordered a nerve block -femur fracture order set utilized -ice prn -norco and dilaudid prn for pain with CKD -SCDs -NPO at midnight in case surgery is planned   Acute renal failure superimposed on stage 4 chronic kidney disease ([place]) Likely due to FTT with poor PO intake over the past 2 weeks, no hx of exogenous losses or urinary complaints Strict I/O UA pending Urine studies  Bladder scan with no urine  Hold/avoid nephrotoxic drugs Gentle, time limited IVF Trend Palliative consult   FTT (failure to thrive) in adult History of weight loss, poor PO intake and overall FTT Nutrition consult, palliative care consult, SLP eval for dysphagia sW consult   Dysphagia New complaint since June  8th. Can not differentiate if oral vs. Pharyngeal/esophageal Good laryngeal elevation with no s/s of aspiration Can swallow liquids fine and grapefruit ? If uremia contributing to not wanting to eat Will have SLP eval Soft renal diet with aspiration precautions   Leukocytosis No signs of symptoms of infection No fever/chills ? Reactive vs. hemoconcentrated Trend fever curve and monitor   Transaminitis Mildly elevated History of not feeling well, fatigue, will check a covid test  Gentle IVF overnight and trend If continues to trend upward, more complete work up   Essential hypertension Well controlled Continue daily norvasc and coreg   History of anemia due to chronic kidney disease Baseline around 10, stable Continue to monitor   Gout No evidence of flair, continue allopurinol     Advance Care Planning:   Code Status: DNR  Consults: ortho: Dr. Alvan Dame,  palliative care/nutrition/SW/SLP   DVT Prophylaxis: SCDs  Family Communication: wife at bedside: betsy Mckeag and daughter: Theodore Demark ([phone])  Severity of Illness: The appropriate patient status for this patient is INPATIENT. Inpatient status is judged to be reasonable and necessary in order to provide the required intensity of service to ensure the patient's safety. The patient's presenting symptoms, physical exam findings, and initial radiographic and laboratory data in the context of their chronic comorbidities is felt to place them at high risk for further clinical deterioration. Furthermore, it is not anticipated that the patient will be medically stable for discharge from the hospital within 2 midnights of admission.   * I certify that at the point of admission it is my clinical judgment that the patient will require inpatient hospital care spanning beyond 2 midnights from the point of admission due to high intensity of service, high risk for further deterioration and high frequency of surveillance  required.*  Author: Orma Flaming, MD 11/25/2021 4:44 PM  For on call review [email or web address].

## 2021-11-25 NOTE — Consult Note (Cosign Needed)
Reason for Consult:Right hip dislocation Referring Physician: Deno Etienne Time called: 0867 Time at bedside: [place] is an 88 y.o. male.  HPI: Danny Rhodes was in his usual state of health until 6/8 when he woke from sleep with severe hip pain. Prior to this he was able to independently ambulate with a RW around the house and do chores for himself. He also has been having problems swallowing though this has been going on longer. He's had progressive worsening pain in that hip for >5y.   Past Medical History:  Diagnosis Date   CKD (chronic kidney disease), symptom management only, stage 4 (severe) (HCC)    Glaucoma    POAG OU   History of anemia due to chronic kidney disease    Hypertensive retinopathy    OU    Past Surgical History:  Procedure Laterality Date   CATARACT EXTRACTION Bilateral    EYE SURGERY Bilateral    Cat Sx    History reviewed. No pertinent family history.  Social History:  reports that he has never smoked. He has never used smokeless tobacco. No history on file for alcohol use and drug use.  Allergies: No Known Allergies  Medications: I have reviewed the patient's current medications.  Results for orders placed or performed during the hospital encounter of 11/25/21 (from the past 48 hour(s))  CBC with Differential     Status: Abnormal   Collection Time: 11/25/21  2:08 PM  Result Value Ref Range   WBC 17.0 (H) 4.0 - 10.5 K/uL   RBC 3.97 (L) 4.22 - 5.81 MIL/uL   Hemoglobin 10.2 (L) 13.0 - 17.0 g/dL   HCT 33.4 (L) 39.0 - 52.0 %   MCV 84.1 80.0 - 100.0 fL   MCH 25.7 (L) 26.0 - 34.0 pg   MCHC 30.5 30.0 - 36.0 g/dL   RDW 17.1 (H) 11.5 - 15.5 %   Platelets 705 (H) 150 - 400 K/uL   nRBC 0.0 0.0 - 0.2 %   Neutrophils Relative % 88 %   Neutro Abs 15.0 (H) 1.7 - 7.7 K/uL   Lymphocytes Relative 4 %   Lymphs Abs 0.7 0.7 - 4.0 K/uL   Monocytes Relative 7 %   Monocytes Absolute 1.2 (H) 0.1 - 1.0 K/uL   Eosinophils Relative 0 %   Eosinophils Absolute  0.0 0.0 - 0.5 K/uL   Basophils Relative 0 %   Basophils Absolute 0.0 0.0 - 0.1 K/uL   Immature Granulocytes 1 %   Abs Immature Granulocytes 0.12 (H) 0.00 - 0.07 K/uL    Comment: Performed at [facility], [address], [place], [place] [address]    No results found.  Review of Systems  HENT:  Positive for trouble swallowing. Negative for ear discharge, ear pain, hearing loss and tinnitus.   Eyes:  Negative for photophobia and pain.  Respiratory:  Negative for cough and shortness of breath.   Cardiovascular:  Negative for chest pain.  Gastrointestinal:  Negative for abdominal pain, nausea and vomiting.  Genitourinary:  Negative for dysuria, flank pain, frequency and urgency.  Musculoskeletal:  Positive for arthralgias (Right hip radiating down leg). Negative for back pain, myalgias and neck pain.  Neurological:  Negative for dizziness and headaches.  Hematological:  Does not bruise/bleed easily.  Psychiatric/Behavioral:  The patient is not nervous/anxious.    Blood pressure (!) 145/56, pulse 67, temperature 98.5 F (36.9 C), resp. rate 16, height 5\' 6"  (1.676 m), weight 72.6 kg, SpO2 100 %.  Physical Exam Constitutional:      General: He is not in acute distress.    Appearance: He is well-developed. He is not diaphoretic.  HENT:     Head: Normocephalic and atraumatic.  Eyes:     General: No scleral icterus.       Right eye: No discharge.        Left eye: No discharge.     Conjunctiva/sclera: Conjunctivae normal.  Cardiovascular:     Rate and Rhythm: Normal rate and regular rhythm.  Pulmonary:     Effort: Pulmonary effort is normal. No respiratory distress.  Musculoskeletal:     Cervical back: Normal range of motion.     Comments: RLE No traumatic wounds, ecchymosis, or rash  Mod TTP hip down to lower leg  No knee or ankle effusion  Knee stable to varus/ valgus and anterior/posterior stress  Sens DPN, SPN, TN intact  Motor EHL, ext, flex, evers 5/5  DP 1+, PT 1+,  No significant edema  Skin:    General: Skin is warm and dry.  Neurological:     Mental Status: He is alert.  Psychiatric:        Mood and Affect: Mood normal.        Behavior: Behavior normal.     Assessment/Plan: Right hip pain -- It appears pt has outlived his implant. Would likely benefit from revision if could tolerate surgery. Will order nerve block for pain control. Dr. Alvan Dame to see pt/family to discuss risk/benefit of intervention. Will need medical admit for clearance should pt/family elect that option and to address his swallowing dysfunction.    Lisette Abu, PA-C Orthopedic Surgery [phone] 11/25/2021, 2:31 PM

## 2021-11-25 NOTE — Assessment & Plan Note (Signed)
No evidence of flair, continue allopurinol

## 2021-11-25 NOTE — Assessment & Plan Note (Addendum)
New complaint since June 8th. Can not differentiate if oral vs. Pharyngeal/esophageal Good laryngeal elevation with no s/s of aspiration Can swallow liquids fine and grapefruit ? If uremia contributing to not wanting to eat -States he feels the food balls up in his mouth and just does not go down. -SLP evaluation pending.

## 2021-11-25 NOTE — Assessment & Plan Note (Addendum)
88 year old male with history of right hip prosthetic with worsening pain over the past 5 years with increased pain over the past 2 weeks found to have dislocation of his prosthesis and has likely outlived it -ortho consulted and Dr. Alvan Dame will visit with patient about surgery -ortho has ordered a nerve block -femur fracture order set utilized -ice prn -norco and dilaudid prn for pain with CKD -Place on scheduled Tylenol. -SCDs -NPO pending evaluation by orthopedics, Dr. Alvan Dame today.

## 2021-11-25 NOTE — Assessment & Plan Note (Addendum)
No signs of symptoms of infection No fever/chills -Likely reactive versus hemoconcentrated. -Leukocytosis trending down. -Follow.

## 2021-11-25 NOTE — Assessment & Plan Note (Addendum)
Mildly elevated History of not feeling well, fatigue, COVID test negative.  Gentle IVF. -LFTs trending down. -Follow with hydration.

## 2021-11-25 NOTE — Assessment & Plan Note (Addendum)
Baseline around 10, -Hemoglobin at seems to be stabilizing at 9.6 today, likely dilutional component as patient with no overt bleeding. -Follow H&H. -Transfusion threshold hemoglobin < 7. Continue to monitor

## 2021-11-25 NOTE — Assessment & Plan Note (Addendum)
History of weight loss, poor PO intake and overall FTT Nutrition consult, palliative care consult, SLP eval and patient currently on a dysphagia 2 diet.

## 2021-11-25 NOTE — ED Provider Notes (Signed)
[facility] EMERGENCY DEPARTMENT Provider Note   CSN: [id number] Arrival date & time: 11/25/21  0945     History  Chief Complaint  Patient presents with   Failure To [place] is a 88 y.o. male.  88 yo M with a chief complaints of right hip pain inability to walk not really wanting to eat or drink.  This been going on for about 3 weeks now.  Has had some chronic pain to the right hip.  Had had it repaired about 20 years ago.  Had seen Dr. Alvan Dame in the office and there was some concern about hardware migration but it was thought that if he was still ambulatory that he would forego surgical intervention.  Patient feels that he has some difficulty swallowing.  Feels like it gets stuck as it goes down.  He thinks this also has gone on for a couple weeks but at 1 point, that he thinks it is due to the amount of pain in his hip.  He denies any obvious trauma to the hip.  Denies any pain in the back.  Denies abdominal pain denies nausea vomiting or diarrhea.        Home Medications Prior to Admission medications   Medication Sig Start Date End Date Taking? Authorizing Provider  acetaminophen (TYLENOL) 500 MG tablet Take 1,000 mg by mouth 2 (two) times daily as needed for headache (pain).   Yes [provider]  allopurinol (ZYLOPRIM) 100 MG tablet Take 100 mg by mouth every morning.   Yes [provider]  amLODipine (NORVASC) 10 MG tablet Take 10 mg by mouth every morning.   Yes [provider]  atorvastatin (LIPITOR) 20 MG tablet Take 20 mg by mouth every morning. 09/26/17  Yes [provider]  carvedilol (COREG) 3.125 MG tablet Take 3.125 mg by mouth 2 (two) times daily with a meal.   Yes [provider]  dorzolamide (TRUSOPT) 2 % ophthalmic solution Place 1 drop into both eyes 2 (two) times daily.   Yes [provider]  furosemide (LASIX) 40 MG tablet Take 40-60 mg by mouth See admin instructions. Take 1  1/2 tablets (60 mg) by mouth every morning and 1 tablet (40 mg) every evening 09/24/21  Yes [provider]  latanoprost (XALATAN) 0.005 % ophthalmic solution Place 1 drop into both eyes at bedtime.   Yes [provider]  traMADol (ULTRAM) 50 MG tablet Take 1 tablet (50 mg total) by mouth at bedtime as needed for up to 6 doses for severe pain. Patient taking differently: Take 100 mg by mouth 2 (two) times daily. 11/16/19  Yes Trifan, Carola Rhine, MD      Allergies    Patient has no known allergies.    Review of Systems   Review of Systems  Physical Exam Updated Vital Signs BP 129/60   Pulse 70   Temp 98.5 F (36.9 C)   Resp 15   Ht 5\' 6"  (1.676 m)   Wt 72.6 kg   SpO2 100%   BMI 25.82 kg/m  Physical Exam Vitals and nursing note reviewed.  Constitutional:      Appearance: He is well-developed.  HENT:     Head: Normocephalic and atraumatic.  Eyes:     Pupils: Pupils are equal, round, and reactive to light.  Neck:     Vascular: No JVD.  Cardiovascular:     Rate and Rhythm: Normal rate and regular rhythm.  Heart sounds: No murmur heard.    No friction rub. No gallop.  Pulmonary:     Effort: No respiratory distress.     Breath sounds: No wheezing.  Abdominal:     General: There is no distension.     Tenderness: There is no abdominal tenderness. There is no guarding or rebound.  Musculoskeletal:        General: Tenderness present. Normal range of motion.     Cervical back: Normal range of motion and neck supple.     Comments: Pulse motor and sensation intact to the right lower extremity.  Right lower extremity is shortened and externally rotated.  No obvious pain with compression of the pelvis.  Skin:    Coloration: Skin is not pale.     Findings: No rash.  Neurological:     Mental Status: He is alert and oriented to person, place, and time.  Psychiatric:        Behavior: Behavior normal.     ED Results / Procedures / Treatments   Labs (all labs  ordered are listed, but only abnormal results are displayed) Labs Reviewed  CBC WITH DIFFERENTIAL/PLATELET - Abnormal; Notable for the following components:      Result Value   WBC 17.0 (*)    RBC 3.97 (*)    Hemoglobin 10.2 (*)    HCT 33.4 (*)    MCH 25.7 (*)    RDW 17.1 (*)    Platelets 705 (*)    Neutro Abs 15.0 (*)    Monocytes Absolute 1.2 (*)    Abs Immature Granulocytes 0.12 (*)    All other components within normal limits  COMPREHENSIVE METABOLIC PANEL - Abnormal; Notable for the following components:   Glucose, Bld 124 (*)    BUN 94 (*)    Creatinine, Ser 4.06 (*)    Albumin 2.7 (*)    AST 73 (*)    ALT 81 (*)    Alkaline Phosphatase 185 (*)    GFR, Estimated 13 (*)    All other components within normal limits  URINALYSIS, ROUTINE W REFLEX MICROSCOPIC    EKG EKG Interpretation  Date/Time:  Wednesday November 25 2021 09:57:27 EDT Ventricular Rate:  64 PR Interval:  280 QRS Duration: 86 QT Interval:  400 QTC Calculation: 412 R Axis:   72 Text Interpretation: Sinus rhythm with 1st degree A-V block Otherwise normal ECG No significant change since last tracing Confirmed by Deno Etienne [phone]) on 11/25/2021 12:36:20 PM  Radiology No results found.  Procedures Procedures    Medications Ordered in ED Medications  acetaminophen (TYLENOL) tablet 1,000 mg (1,000 mg Oral Given 11/25/21 1346)  oxyCODONE (Oxy IR/ROXICODONE) immediate release tablet 5 mg (5 mg Oral Given 11/25/21 1346)  sodium chloride 0.9 % bolus 1,000 mL (1,000 mLs Intravenous New Bag/Given 11/25/21 1406)    ED Course/ Medical Decision Making/ A&P                           Medical Decision Making Amount and/or Complexity of Data Reviewed Labs: ordered. Radiology: ordered.  Risk OTC drugs. Prescription drug management. Decision regarding hospitalization.   88 yo M with a chief complaints of right hip pain.  Clinically the patient has either fracture or dislocation with right leg shortening.   Plain film independently interpreted by me with likely hardware malalignment of the acetabulum.  Dislocated.  Will discuss with Ortho.  Blood work.  Chest x-ray. CXR independently interpreted by  me without focal infiltrate or ptx.    Ortho evaluated at bedside.  Feels the patient likely needs a hip replacement revision.  Plan to talk this over at bedside at length to see if that something the patient and her family would want.  We will discuss with medicine for admission.  AKI, leukocytosis and mild anemia.    The patients results and plan were reviewed and discussed.   Any x-rays performed were independently reviewed by myself.   Differential diagnosis were considered with the presenting HPI.  Medications  acetaminophen (TYLENOL) tablet 1,000 mg (1,000 mg Oral Given 11/25/21 1346)  oxyCODONE (Oxy IR/ROXICODONE) immediate release tablet 5 mg (5 mg Oral Given 11/25/21 1346)  sodium chloride 0.9 % bolus 1,000 mL (1,000 mLs Intravenous New Bag/Given 11/25/21 1406)    Vitals:   11/25/21 0948 11/25/21 0953 11/25/21 1345 11/25/21 1450  BP:  (!) 148/51 (!) 145/56 129/60  Pulse:  64 67 70  Resp:  15 16 15   Temp:  98.5 F (36.9 C)    SpO2: 97% 97% 100% 100%  Weight:      Height:        Final diagnoses:  Dislocation of right hip, initial encounter ([place])  AKI (acute kidney injury) ([place])  Failure to thrive in adult    Admission/ observation were discussed with the admitting physician, patient and/or family and they are comfortable with the plan.          Final Clinical Impression(s) / ED Diagnoses Final diagnoses:  Dislocation of right hip, initial encounter ([place])  AKI (acute kidney injury) ([place])  Failure to thrive in adult    Rx / DC Orders ED Discharge Orders     None         Deno Etienne, DO 11/25/21 1518

## 2021-11-26 DIAGNOSIS — S73004A Unspecified dislocation of right hip, initial encounter: Secondary | ICD-10-CM

## 2021-11-26 DIAGNOSIS — Z66 Do not resuscitate: Secondary | ICD-10-CM

## 2021-11-26 DIAGNOSIS — T84020A Dislocation of internal right hip prosthesis, initial encounter: Secondary | ICD-10-CM | POA: Diagnosis not present

## 2021-11-26 DIAGNOSIS — R1310 Dysphagia, unspecified: Secondary | ICD-10-CM

## 2021-11-26 DIAGNOSIS — Z515 Encounter for palliative care: Secondary | ICD-10-CM

## 2021-11-26 DIAGNOSIS — Z862 Personal history of diseases of the blood and blood-forming organs and certain disorders involving the immune mechanism: Secondary | ICD-10-CM

## 2021-11-26 DIAGNOSIS — R627 Adult failure to thrive: Secondary | ICD-10-CM | POA: Diagnosis not present

## 2021-11-26 DIAGNOSIS — D638 Anemia in other chronic diseases classified elsewhere: Secondary | ICD-10-CM

## 2021-11-26 DIAGNOSIS — N179 Acute kidney failure, unspecified: Secondary | ICD-10-CM

## 2021-11-26 DIAGNOSIS — I10 Essential (primary) hypertension: Secondary | ICD-10-CM

## 2021-11-26 DIAGNOSIS — D72829 Elevated white blood cell count, unspecified: Secondary | ICD-10-CM

## 2021-11-26 DIAGNOSIS — N184 Chronic kidney disease, stage 4 (severe): Secondary | ICD-10-CM

## 2021-11-26 DIAGNOSIS — M109 Gout, unspecified: Secondary | ICD-10-CM

## 2021-11-26 DIAGNOSIS — R7401 Elevation of levels of liver transaminase levels: Secondary | ICD-10-CM

## 2021-11-26 DIAGNOSIS — N189 Chronic kidney disease, unspecified: Secondary | ICD-10-CM

## 2021-11-26 LAB — COMPREHENSIVE METABOLIC PANEL
ALT: 65 U/L — ABNORMAL HIGH (ref 0–44)
AST: 53 U/L — ABNORMAL HIGH (ref 15–41)
Albumin: 2.4 g/dL — ABNORMAL LOW (ref 3.5–5.0)
Alkaline Phosphatase: 161 U/L — ABNORMAL HIGH (ref 38–126)
Anion gap: 12 (ref 5–15)
BUN: 88 mg/dL — ABNORMAL HIGH (ref 8–23)
CO2: 23 mmol/L (ref 22–32)
Calcium: 9.6 mg/dL (ref 8.9–10.3)
Chloride: 102 mmol/L (ref 98–111)
Creatinine, Ser: 3.54 mg/dL — ABNORMAL HIGH (ref 0.61–1.24)
GFR, Estimated: 15 mL/min — ABNORMAL LOW (ref >60)
Glucose, Bld: 118 mg/dL — ABNORMAL HIGH (ref 70–99)
Potassium: 3.6 mmol/L (ref 3.5–5.1)
Sodium: 137 mmol/L (ref 135–145)
Total Bilirubin: 0.7 mg/dL (ref 0.3–1.2)
Total Protein: 7.3 g/dL (ref 6.5–8.1)

## 2021-11-26 LAB — CBC
HCT: 31.1 % — ABNORMAL LOW (ref 39.0–52.0)
Hemoglobin: 10.1 g/dL — ABNORMAL LOW (ref 13.0–17.0)
MCH: 26.9 pg (ref 26.0–34.0)
MCHC: 32.5 g/dL (ref 30.0–36.0)
MCV: 82.7 fL (ref 80.0–100.0)
Platelets: 623 10*3/uL — ABNORMAL HIGH (ref 150–400)
RBC: 3.76 MIL/uL — ABNORMAL LOW (ref 4.22–5.81)
RDW: 17.2 % — ABNORMAL HIGH (ref 11.5–15.5)
WBC: 14.5 10*3/uL — ABNORMAL HIGH (ref 4.0–10.5)
nRBC: 0 % (ref 0.0–0.2)

## 2021-11-26 LAB — SURGICAL PCR SCREEN
MRSA, PCR: NEGATIVE
Staphylococcus aureus: NEGATIVE

## 2021-11-26 MED ORDER — SODIUM CHLORIDE 0.9 % IV SOLN: INTRAVENOUS | Status: AC

## 2021-11-26 MED ORDER — PNEUMOCOCCAL 20-VAL CONJ VACC 0.5 ML IM SUSY
0.5000 mL | PREFILLED_SYRINGE | INTRAMUSCULAR | Status: DC
  Filled 2021-11-26: qty 0.5

## 2021-11-26 MED ORDER — OXYCODONE HCL 5 MG PO TABS
5.0000 mg | ORAL_TABLET | Freq: Four times a day (QID) | ORAL | Status: DC | PRN
  Administered 2021-11-28 – 2021-12-01 (×4): 5 mg via ORAL
  Filled 2021-11-26 (×4): qty 1

## 2021-11-26 MED ORDER — MUPIROCIN 2 % EX OINT
1.0000 | TOPICAL_OINTMENT | Freq: Two times a day (BID) | CUTANEOUS | Status: DC
  Administered 2021-11-26 – 2021-11-27 (×2): 1 via NASAL

## 2021-11-26 MED ORDER — ACETAMINOPHEN 500 MG PO TABS
500.0000 mg | ORAL_TABLET | Freq: Three times a day (TID) | ORAL | Status: DC
  Administered 2021-11-26 – 2021-12-01 (×14): 500 mg via ORAL
  Filled 2021-11-26 (×14): qty 1

## 2021-11-26 NOTE — Consult Note (Cosign Needed)
Consultation Note Date: 11/26/2021   Patient Name: Danny Rhodes  DOB: [birth year]  MRN: [id number]  Age / Sex: 88 y.o., male  PCP: [name], MD Referring Physician: Eugenie Filler, MD  Reason for Consultation: Establishing goals of care  HPI/Patient Profile: 88 y.o. male  with past medical history of glaucoma, HTN, gout, anemia due to CKD, and right hip prosthesis admitted on 11/25/2021 with right hip pain.  Patient has been diagnosed with dislocation of anterior right hip prosthesis.  Orthopedics is to evaluate but has initially recommended a nerve block.  Patient is being treated for right hip pain as well as failure to thrive, renal failure, leukocytosis, and transaminitis.  Palliative medicine team was consulted to discuss goals of care.  Clinical Assessment and Goals of Care: I have reviewed medical records including EPIC notes, labs and imaging, assessed the patient and then met with patient, his wife Danny Rhodes, and his daughter Danny Rhodes and Danny Rhodes to discuss diagnosis prognosis, GOC, EOL wishes, disposition and options.  I introduced Palliative Medicine as specialized medical care for people living with serious illness. It focuses on providing relief from the symptoms and stress of a serious illness. The goal is to improve quality of life for both the patient and the family.  Prior to hospitalization patient was active and able to complete ADLs almost independently with use of walker.  Family endorses poor appetite with low p.o. intake.  Patient and family endorse that food that needed to be chewed would get stuck like a ball in the back of his throat.  However, patient Dors he has no problem swallowing grapefruit or grapes.  Family endorses no cognitive/memory deficits.   We discussed patient's current illness and what it means in the larger context of patient's on-going co-morbidities. I attempted to  elicit values and goals of care important to the patient.  Family shares that the goal is to have patient return home and to ensure the highest level of quality of life for him.  Family appreciates that patient will have a new baseline of physical/functional ability.  Family is facing anticipatory care needs.Patient's family asked appropriate questions regarding outpatient palliative care, hospice, and [facility] services.    Family is facing treatment options.  Family endorses they do not want orthopedic surgery but would like to further evaluate if patient is candidate for nerve block.  Discussed that depending on the type of nerve block patient could get relief of pain for up to 24 hours or 1 to 2 weeks.  Orthopedics will be able to further discuss options.  The difference between aggressive medical intervention and comfort care was considered in light of the patient's goals of care.  Family reports they are not ready to focus strongly on comfort.  Family endorses they want patient to continue to see his regular doctors.  I shared my concern that patient's functional ability may limit his ability to go to these doctors appointments.  However, family would like to wait to evaluate patient's baseline after  nerve block and pain better controlled.  Also shared my concerns regarding patient's nutritional status.  Reviewed that once orthopedics has weighed in and patient is given a diet then SLP will be able to better evaluate patient's swallowing abilities and make diet recommendations.  Education offered regarding concept specific to human mortality and the limitations of medical interventions to prolong life when the body begins to fail to thrive.  Therapeutic sounds and active listening provided for patient and family to share their thoughts and emotions regarding patient's current health status.  Discussed with patient/family the importance of continued conversation with family and the medical  providers regarding overall plan of care and treatment options, ensuring decisions are within the context of the patient's values and GOCs.    Questions and concerns were addressed. Family is awaiting input from ortho and SLP. The family was encouraged to call with questions or concerns.   Primary Decision Maker PATIENT  Code Status/Advance Care Planning: DNR  Prognosis:   Unable to determine  Discharge Planning: Home with Palliative Services  Primary Diagnoses: Present on Admission:  (Resolved) Anemia of chronic disease   Physical Exam Vitals reviewed.  Constitutional:      General: He is not in acute distress.    Appearance: Normal appearance. He is normal weight.  HENT:     Head: Normocephalic.     Mouth/Throat:     Mouth: Mucous membranes are moist.  Eyes:     Pupils: Pupils are equal, round, and reactive to light.  Cardiovascular:     Pulses: Normal pulses.  Pulmonary:     Effort: Pulmonary effort is normal.  Abdominal:     Palpations: Abdomen is soft.  Musculoskeletal:     Comments: Generalized weakness - significant pain with touch or mild movement of RLE  Skin:    General: Skin is warm and dry.  Neurological:     Mental Status: He is alert and oriented to person, place, and time.  Psychiatric:        Mood and Affect: Mood normal.        Behavior: Behavior normal.        Thought Content: Thought content normal.        Judgment: Judgment normal.     Palliative Assessment/Data: 30%     I discussed this patient's plan of care with patient, patient's wife, patient's two daughters, RN Latoya.  Thank you for this consult. Palliative medicine will continue to follow and assist holistically.   Time Total: 75 minutes Greater than 50%  of this time was spent counseling and coordinating care related to the above assessment and plan.  Signed by: Jordan Hawks, DNP, FNP-BC Palliative Medicine    Please contact Palliative Medicine Team phone at  [phone] for questions and concerns.  For individual provider: See Shea Evans

## 2021-11-26 NOTE — Progress Notes (Signed)
SLP Cancellation Note  Patient Details Name: Danny Rhodes MRN: [id number] DOB: 08/05/1926   Cancelled treatment:        Attempted to see pt for clinical swallow evaluation.  Pt is NPO for possible surgery.  SLP will reattempt as schedule permits when pt is appropriate for PO trials.   Celedonio Savage , MA, [place] Office: [phone]; Pager (6/22): [phone] 11/26/2021, 9:49 AM

## 2021-11-26 NOTE — TOC Initial Note (Addendum)
Transition of Care [facility]) - Initial/Assessment Note    Patient Details  Name: Danny Rhodes MRN: [id number] Date of Birth: 05/12/1927  [facility] CM/SW Contact:    Joanne Chars, LCSW Phone Number: 11/26/2021, 1:07 PM  Clinical Narrative:  Family requested to speak with CSW, initial assess also completed.  Pt in room with wife  Gwinda Passe and daughters Freda Munro and Kendrick Fries.  Permission given to speak with all of them.  Pt lives at home with Kendrick Fries and his wife, no current services.  Per Freda Munro, they are waiting for ortho MD to visit but are not planning to pursue surgery.  She asked about referral for palliative care.  Choice discussed, they would like referral to [facility] and Freda Munro would like to know how quickly [facility] would call.  Further DC plans pending meeting with ortho.  CSW spoke with [facility] and made referral to [facility]. TOC will continue to follow for other DC needs.                 Expected Discharge Plan:  (TBD) Barriers to Discharge: Continued Medical Work up   Patient Goals and CMS Choice Patient states their goals for this hospitalization and ongoing recovery are:: walking      Expected Discharge Plan and Services Expected Discharge Plan:  (TBD) In-house Referral: Clinical Social Work   Post Acute Care Choice:  (TBD) Living arrangements for the past 2 months: Single Family Home                                      Prior Living Arrangements/Services Living arrangements for the past 2 months: Single Family Home Lives with:: Spouse, Adult Children (with wife and daughter Kendrick Fries) Patient language and need for interpreter reviewed:: Yes Do you feel safe going back to the place where you live?: Yes      Need for Family Participation in Patient Care: Yes (Comment) Care giver support system in place?: Yes (comment) Current home services: Other (comment) (none) Criminal Activity/Legal Involvement Pertinent to Current  Situation/Hospitalization: No - Comment as needed  Activities of Daily Living Home Assistive Devices/Equipment: Walker (specify type) (front wheel) ADL Screening (condition at time of admission) Patient's cognitive ability adequate to safely complete daily activities?: Yes Is the patient deaf or have difficulty hearing?: No Does the patient have difficulty seeing, even when wearing glasses/contacts?: No Does the patient have difficulty concentrating, remembering, or making decisions?: No Patient able to express need for assistance with ADLs?: Yes Does the patient have difficulty dressing or bathing?: Yes Independently performs ADLs?: No Communication: Independent Dressing (OT): Needs assistance Is this a change from baseline?: Pre-admission baseline Grooming: Needs assistance Is this a change from baseline?: Pre-admission baseline Feeding: Independent Bathing: Needs assistance Is this a change from baseline?: Pre-admission baseline Toileting: Needs assistance Is this a change from baseline?: Pre-admission baseline In/Out Bed: Needs assistance Is this a change from baseline?: Pre-admission baseline Walks in Home: Needs assistance Is this a change from baseline?: Pre-admission baseline Does the patient have difficulty walking or climbing stairs?: Yes Weakness of Legs: Right Weakness of Arms/Hands: None  Permission Sought/Granted Permission sought to share information with : Family Supports Permission granted to share information with : Yes, Verbal Permission Granted  Share Information with NAME: wife Gwinda Passe, both daughters: Freda Munro and Kendrick Fries  Permission granted to share info w AGENCY: [facility]        Emotional  Assessment Appearance:: Appears stated age Attitude/Demeanor/Rapport: Engaged Affect (typically observed): Pleasant, Appropriate Orientation: :  (not recorded, appears oriented)      Admission diagnosis:  Failure to thrive in adult [R62.7] AKI (acute  kidney injury) ([place]) [N17.9] Dislocation of right hip, initial encounter ([place]) [G54.982M] Dislocation of internal right hip prosthesis, initial encounter [facility]) [T84.020A] Patient Active Problem List   Diagnosis Date Noted   Failure to thrive in adult    Leukocytosis 11/25/2021   Acute renal failure superimposed on stage 4 chronic kidney disease ([place]) 11/25/2021   FTT (failure to thrive) in adult 11/25/2021   Dislocation of internal right hip prosthesis, initial encounter ([place]) 11/25/2021   Transaminitis 11/25/2021   Dysphagia 11/25/2021   Essential hypertension 11/25/2021   Gout 11/25/2021   CKD (chronic kidney disease), symptom management only, stage 4 (severe) ([place]) 04/23/2021   History of anemia due to chronic kidney disease 04/23/2021   PCP:  [name], MD Pharmacy:   [facility] [address], [address] [place] [address] Phone: [phone] Fax: [phone]  [facility] ([facility] ) - [place], [address] [address] Phone: [phone] Fax: [phone]     Social Determinants of Health ([place]) Interventions    Readmission Risk Interventions     No data to display

## 2021-11-26 NOTE — Plan of Care (Signed)

## 2021-11-26 NOTE — Plan of Care (Signed)
No acute events overnight. Problem: Education: Goal: Knowledge of General Education information will improve Description: Including pain rating scale, medication(s)/side effects and non-pharmacologic comfort measures Outcome: Progressing   Problem: Health Behavior/Discharge Planning: Goal: Ability to manage health-related needs will improve Outcome: Progressing   Problem: Clinical Measurements: Goal: Ability to maintain clinical measurements within normal limits will improve Outcome: Progressing Goal: Will remain free from infection Outcome: Progressing Goal: Diagnostic test results will improve Outcome: Progressing Goal: Respiratory complications will improve Outcome: Progressing Goal: Cardiovascular complication will be avoided Outcome: Progressing   Problem: Activity: Goal: Risk for activity intolerance will decrease Outcome: Progressing   Problem: Nutrition: Goal: Adequate nutrition will be maintained Outcome: Progressing   Problem: Coping: Goal: Level of anxiety will decrease Outcome: Progressing   Problem: Elimination: Goal: Will not experience complications related to bowel motility Outcome: Progressing Goal: Will not experience complications related to urinary retention Outcome: Progressing   Problem: Pain Managment: Goal: General experience of comfort will improve Outcome: Progressing   Problem: Safety: Goal: Ability to remain free from injury will improve Outcome: Progressing   Problem: Skin Integrity: Goal: Risk for impaired skin integrity will decrease Outcome: Progressing   Problem: Education: Goal: Knowledge of the prescribed therapeutic regimen will improve Outcome: Progressing Goal: Understanding of discharge needs will improve Outcome: Progressing Goal: Individualized Educational Video(s) Outcome: Progressing   Problem: Activity: Goal: Ability to avoid complications of mobility impairment will improve Outcome: Progressing Goal: Ability  to tolerate increased activity will improve Outcome: Progressing   Problem: Clinical Measurements: Goal: Postoperative complications will be avoided or minimized Outcome: Progressing   Problem: Pain Management: Goal: Pain level will decrease with appropriate interventions Outcome: Progressing   Problem: Skin Integrity: Goal: Will show signs of wound healing Outcome: Progressing   

## 2021-11-26 NOTE — Progress Notes (Signed)
PROGRESS NOTE    Danny Rhodes  KVQ:[id number] DOB: 07-16-1926 DOA: 11/25/2021 PCP: [name], MD    Chief Complaint  Patient presents with   Failure To Thrive    Brief Narrative:  Patient is 88 year old gentleman history of CKD stage IV, HTN, hyperlipidemia, anemia of chronic disease, presented to the ED with complaints of right hip pain and failure to thrive over the past few weeks.  Patient noted to have had right hip pain for about the past 5 years that has progressively been getting worse and started to use a cane about 1.5 years ago and as well as a walker about a year ago.  Patient noted to have woken up on June 8 with severe pain and chills not feeling well and no appetite.  Patient denies any falls or trauma.  Tylenol did not help his pain.  Tramadol noted to have eased it but did not take the pain away.  Over the past 2 weeks patient has had decreased oral intake and due to significant weakness and pain family brought him to the ED.  Patient with some complaints of dysphagia to solid foods on 6 /8 but no on to liquids or pills.  Patient seen in the ED work-up concerning for dislocation of internal right hip prosthesis, acute kidney injury on CKD stage IV, failure to thrive, dysphagia.  Orthopedics consulted.  Speech therapy consulted.    Assessment & Plan:  Principal Problem:   Dislocation of internal right hip prosthesis, initial encounter ([place]) Active Problems:   Acute renal failure superimposed on stage 4 chronic kidney disease (HCC)   FTT (failure to thrive) in adult   Dysphagia   Leukocytosis   Transaminitis   Essential hypertension   History of anemia due to chronic kidney disease   Gout   Failure to thrive in adult    Assessment and Plan: * Dislocation of internal right hip prosthesis, initial encounter ([place]) 88 year old male with history of right hip prosthetic with worsening pain over the past 5 years with increased pain over the past 2 weeks found to have  dislocation of his prosthesis and has likely outlived it -ortho consulted and Dr. Alvan Dame will visit with patient about surgery -ortho has ordered a nerve block -femur fracture order set utilized -ice prn -norco and dilaudid prn for pain with CKD -Place on scheduled Tylenol. -SCDs -NPO pending evaluation by orthopedics, Dr. Alvan Dame today.   Acute renal failure superimposed on stage 4 chronic kidney disease ([place]) Likely due to prerenal azotemia secondary to FTT with poor PO intake over the past 2 weeks, no hx of exogenous losses or urinary complaints Strict I/O Urinalysis unremarkable. -Urine sodium 63, urine creatinine 46.09 Bladder scan with no urine  -Baseline creatinine approximately 2.3-2.5. -Urine output not recorded. -Creatinine slowly trending down with hydration. -Place on normal saline 75 cc an hour. Hold/avoid nephrotoxic drugs. Palliative consult placed.   FTT (failure to thrive) in adult History of weight loss, poor PO intake and overall FTT Nutrition consult, palliative care consult, SLP eval for dysphagia sW consult   Dysphagia New complaint since June 8th. Can not differentiate if oral vs. Pharyngeal/esophageal Good laryngeal elevation with no s/s of aspiration Can swallow liquids fine and grapefruit ? If uremia contributing to not wanting to eat -States he feels the food balls up in his mouth and just does not go down. -SLP evaluation pending.  Leukocytosis No signs of symptoms of infection No fever/chills -Likely reactive versus hemoconcentrated. -Leukocytosis trending  down. -Follow.  Transaminitis Mildly elevated History of not feeling well, fatigue, COVID test negative.  Gentle IVF. -LFTs trending down. -Follow with hydration.  Essential hypertension Well controlled Continue daily norvasc and coreg   History of anemia due to chronic kidney disease Baseline around 10, stable Continue to monitor   Gout No evidence of flair, continue allopurinol           DVT prophylaxis: DVT prophylaxis per orthopedics. Code Status: DNR Family Communication: Updated patient.  No family at bedside. Disposition: TBD  Status is: Inpatient Remains inpatient appropriate because: Severity of illness   Consultants:  Orthopedics: Hilbert Odor, PA 11/25/2021  Procedures:  Chest x-ray 11/25/2021 Plain films of the right hip and pelvis 11/25/2021   Antimicrobials:  None   Subjective: Patient laying in bed.  No chest pain.  No shortness of breath.  No abdominal pain.  Complain of right hip pain with minimal movement.  Objective: Vitals:   11/25/21 2300 11/26/21 0213 11/26/21 0420 11/26/21 0906  BP: (!) 146/65 (!) 173/65 (!) 153/58 (!) 144/52  Pulse: 66 81 77 77  Resp: 16 16 19 18   Temp:  97.7 F (36.5 C) 98 F (36.7 C) 98.2 F (36.8 C)  TempSrc:  Oral  Oral  SpO2: 100%  100% 98%  Weight:      Height:        Intake/Output Summary (Last 24 hours) at 11/26/2021 1029 Last data filed at 11/26/2021 8242 Gross per 24 hour  Intake 1000 ml  Output 600 ml  Net 400 ml   Filed Weights   11/25/21 0947  Weight: 72.6 kg    Examination:  General exam: Appears calm and comfortable  Respiratory system: Clear to auscultation anterior lung fields.  No wheezes, no crackles, no rhonchi. Respiratory effort normal. Cardiovascular system: S1 & S2 heard, RRR. No JVD, murmurs, rubs, gallops or clicks. No pedal edema. Gastrointestinal system: Abdomen is nondistended, soft and nontender. No organomegaly or masses felt. Normal bowel sounds heard. Central nervous system: Alert and oriented. No focal neurological deficits. Extremities: Right lower extremity externally rotated and shortened.  Significant tenderness to palpation right hip area.  No edema.  Skin: No rashes, lesions or ulcers Psychiatry: Judgement and insight appear normal. Mood & affect appropriate.     Data Reviewed:   CBC: Recent Labs  Lab 11/25/21 1408 11/26/21 0229  WBC  17.0* 14.5*  NEUTROABS 15.0*  --   HGB 10.2* 10.1*  HCT 33.4* 31.1*  MCV 84.1 82.7  PLT 705* 623*    Basic Metabolic Panel: Recent Labs  Lab 11/25/21 1408 11/26/21 0229  NA 135 137  K 4.2 3.6  CL 101 102  CO2 22 23  GLUCOSE 124* 118*  BUN 94* 88*  CREATININE 4.06* 3.54*  CALCIUM 10.3 9.6    GFR: Estimated Creatinine Clearance: 12 mL/min (A) (by C-G formula based on SCr of 3.54 mg/dL (H)).  Liver Function Tests: Recent Labs  Lab 11/25/21 1408 11/26/21 0229  AST 73* 53*  ALT 81* 65*  ALKPHOS 185* 161*  BILITOT 0.8 0.7  PROT 7.9 7.3  ALBUMIN 2.7* 2.4*    CBG: No results for input(s): "GLUCAP" in the last 168 hours.   Recent Results (from the past 240 hour(s))  SARS Coronavirus 2 by RT PCR (hospital order, performed in [facility] hospital lab) *cepheid single result test* Urine, Clean Catch     Status: None   Collection Time: 11/25/21  4:42 PM   Specimen: Urine, Clean Catch; Nasal  Swab  Result Value Ref Range Status   SARS Coronavirus 2 by RT PCR NEGATIVE NEGATIVE Final    Comment: (NOTE) SARS-CoV-2 target nucleic acids are NOT DETECTED.  The SARS-CoV-2 RNA is generally detectable in upper and lower respiratory specimens during the acute phase of infection. The lowest concentration of SARS-CoV-2 viral copies this assay can detect is 250 copies / mL. A negative result does not preclude SARS-CoV-2 infection and should not be used as the sole basis for treatment or other patient management decisions.  A negative result may occur with improper specimen collection / handling, submission of specimen other than nasopharyngeal swab, presence of viral mutation(s) within the areas targeted by this assay, and inadequate number of viral copies (<250 copies / mL). A negative result must be combined with clinical observations, patient history, and epidemiological information.  Fact Sheet for Patients:   https://www.patel.info/  Fact Sheet for  Healthcare Providers: https://hall.com/  This test is not yet approved or  cleared by the [place] FDA and has been authorized for detection and/or diagnosis of SARS-CoV-2 by FDA under an Emergency Use Authorization (EUA).  This EUA will remain in effect (meaning this test can be used) for the duration of the COVID-19 declaration under Section 564(b)(1) of the Act, 21 U.S.C. section 360bbb-3(b)(1), unless the authorization is terminated or revoked sooner.  Performed at [facility], [place] [address], [place], [place] [address]          Radiology Studies: DG Chest Port 1 View  Result Date: 11/25/2021 CLINICAL DATA:  Preop EXAM: PORTABLE CHEST 1 VIEW COMPARISON:  Chest x-ray dated December 25, 2018 FINDINGS: The heart size and mediastinal contours are within normal limits. Calcifications of the aortic arch. Both lungs are clear. The visualized skeletal structures are unremarkable. IMPRESSION: No active disease. Electronically Signed   By: Yetta Glassman M.D.   On: 11/25/2021 13:25   DG Hip Unilat W or Wo Pelvis 2-3 Views Right  Result Date: 11/25/2021 CLINICAL DATA:  Concern for dislocation. EXAM: DG HIP (WITH OR WITHOUT PELVIS) 3V RIGHT COMPARISON:  None. FINDINGS: Prior right total hip replacement with superolateral dislocation of the femoral hardware component. Lucency in the greater trochanter adjacent to the proximal lateral femoral stem. Cerclage wires surrounding the femoral hardware stem. Acetabular cup position appears medially rotated, although evaluation is limited due to lack of prior imaging. No evidence of fracture. Bowel gas extends below the pelvis, likely due to large abdominal wall hernia. IMPRESSION: 1. Prior right total hip replacement with superolateral dislocation of the femoral hardware component 2. Periimplant lucency involving the in the greater trochanter, finding could be due to age-indeterminate hardware loosening. 3. Acetabular cup  position appears medially rotated, although evaluation is limited due to lack of prior imaging. Electronically Signed   By: Yetta Glassman M.D.   On: 11/25/2021 13:17        Scheduled Meds:  allopurinol  100 mg Oral q morning   amLODipine  10 mg Oral q morning   carvedilol  3.125 mg Oral BID WC   dorzolamide  1 drop Both Eyes BID   latanoprost  1 drop Both Eyes QHS   mupirocin ointment  1 Application Nasal BID   [START ON 11/27/2021] pneumococcal 20-valent conjugate vaccine  0.5 mL Intramuscular Tomorrow-1000   Continuous Infusions:  sodium chloride       LOS: 1 day    Time spent: 40 minutes    Irine Seal, MD [facility]   To contact  the attending provider between 7A-7P or the covering provider during after hours 7P-7A, please log into the web site [email or web address] and access using universal [place] password for that web site. If you do not have the password, please call the hospital operator.  11/26/2021, 10:29 AM

## 2021-11-27 ENCOUNTER — Inpatient Hospital Stay (HOSPITAL_COMMUNITY): Payer: Medicare Other

## 2021-11-27 DIAGNOSIS — T84020A Dislocation of internal right hip prosthesis, initial encounter: Secondary | ICD-10-CM | POA: Diagnosis not present

## 2021-11-27 DIAGNOSIS — R1310 Dysphagia, unspecified: Secondary | ICD-10-CM | POA: Diagnosis not present

## 2021-11-27 DIAGNOSIS — D638 Anemia in other chronic diseases classified elsewhere: Secondary | ICD-10-CM | POA: Diagnosis not present

## 2021-11-27 DIAGNOSIS — E43 Unspecified severe protein-calorie malnutrition: Secondary | ICD-10-CM | POA: Insufficient documentation

## 2021-11-27 DIAGNOSIS — Z515 Encounter for palliative care: Secondary | ICD-10-CM | POA: Diagnosis not present

## 2021-11-27 DIAGNOSIS — N179 Acute kidney failure, unspecified: Secondary | ICD-10-CM | POA: Diagnosis not present

## 2021-11-27 LAB — PHOSPHORUS: Phosphorus: 4.5 mg/dL (ref 2.5–4.6)

## 2021-11-27 LAB — CBC
HCT: 30.6 % — ABNORMAL LOW (ref 39.0–52.0)
Hemoglobin: 10 g/dL — ABNORMAL LOW (ref 13.0–17.0)
MCH: 27 pg (ref 26.0–34.0)
MCHC: 32.7 g/dL (ref 30.0–36.0)
MCV: 82.7 fL (ref 80.0–100.0)
Platelets: 644 10*3/uL — ABNORMAL HIGH (ref 150–400)
RBC: 3.7 MIL/uL — ABNORMAL LOW (ref 4.22–5.81)
RDW: 17.2 % — ABNORMAL HIGH (ref 11.5–15.5)
WBC: 14.2 10*3/uL — ABNORMAL HIGH (ref 4.0–10.5)
nRBC: 0 % (ref 0.0–0.2)

## 2021-11-27 LAB — COMPREHENSIVE METABOLIC PANEL
ALT: 50 U/L — ABNORMAL HIGH (ref 0–44)
AST: 42 U/L — ABNORMAL HIGH (ref 15–41)
Albumin: 2.1 g/dL — ABNORMAL LOW (ref 3.5–5.0)
Alkaline Phosphatase: 139 U/L — ABNORMAL HIGH (ref 38–126)
Anion gap: 10 (ref 5–15)
BUN: 78 mg/dL — ABNORMAL HIGH (ref 8–23)
CO2: 20 mmol/L — ABNORMAL LOW (ref 22–32)
Calcium: 9.5 mg/dL (ref 8.9–10.3)
Chloride: 110 mmol/L (ref 98–111)
Creatinine, Ser: 2.95 mg/dL — ABNORMAL HIGH (ref 0.61–1.24)
GFR, Estimated: 19 mL/min — ABNORMAL LOW (ref >60)
Glucose, Bld: 111 mg/dL — ABNORMAL HIGH (ref 70–99)
Potassium: 3.7 mmol/L (ref 3.5–5.1)
Sodium: 140 mmol/L (ref 135–145)
Total Bilirubin: 0.8 mg/dL (ref 0.3–1.2)
Total Protein: 6.5 g/dL (ref 6.5–8.1)

## 2021-11-27 LAB — MAGNESIUM: Magnesium: 2.4 mg/dL (ref 1.7–2.4)

## 2021-11-27 MED ORDER — PANTOPRAZOLE SODIUM 40 MG PO TBEC
40.0000 mg | DELAYED_RELEASE_TABLET | Freq: Every day | ORAL | Status: DC
  Administered 2021-11-27 – 2021-12-05 (×8): 40 mg via ORAL
  Filled 2021-11-27 (×8): qty 1

## 2021-11-27 MED ORDER — SENNOSIDES-DOCUSATE SODIUM 8.6-50 MG PO TABS
1.0000 | ORAL_TABLET | Freq: Two times a day (BID) | ORAL | Status: DC
Start: 2021-11-27 — End: 2021-12-05
  Administered 2021-11-27 – 2021-12-05 (×15): 1 via ORAL
  Filled 2021-11-27 (×16): qty 1

## 2021-11-27 NOTE — Evaluation (Signed)
Clinical/Bedside Swallow Evaluation Patient Details  Name: Danny Rhodes MRN: [id number] Date of Birth: Apr 14, 1927  Today's Date: 11/27/2021 Time: SLP Start Time (ACUTE ONLY): 4098 SLP Stop Time (ACUTE ONLY): 0940 SLP Time Calculation (min) (ACUTE ONLY): 16 min  Past Medical History:  Past Medical History:  Diagnosis Date   CKD (chronic kidney disease), symptom management only, stage 4 (severe) (HCC)    Glaucoma    POAG OU   History of anemia due to chronic kidney disease    Hypertensive retinopathy    OU   Past Surgical History:  Past Surgical History:  Procedure Laterality Date   CATARACT EXTRACTION Bilateral    EYE SURGERY Bilateral    Cat Sx   HPI:  Patient is 88 year old gentleman who presented to the ED with complaints of right hip pain and failure to thrive over the past few weeks. ED work-up concerning for dislocation of internal right hip prosthesis, acute kidney injury on CKD stage IV, failure to thrive, dysphagia. Over the past 2 weeks patient has had decreased oral intake and due to significant weakness and pain family brought him to the ED.  Patient with some complaints of dysphagia to solid foods on 6/8 but no on to liquids or pills. CXR 6/21 with no active disease.  Pt with history of CKD stage IV, HTN, hyperlipidemia, anemia of chronic disease,    Assessment / Plan / Recommendation  Clinical Impression  Pt presents with functional oropharyngeal swallowing as assessed clinically. Pt tolerated all consistencies trialed with no clinical s/s of aspiration, including serial straw sips of thin liquid.  Pt exhibited excellent oral clearance of solids despite edentulism with slightly prolonged oral phase. Pt does not wear dentures at baseline.  Pt utilized liquid wash independently. Pt with significant R sided pain and could only tolerate HOB raised to 36 degrees, but this did not appear to interfere with swallow function.  Pt's description of symptoms are seemingly consistent  with esophageal dysphagia. Pt c/o stasis after swallow with food "not going down" and endorses that he sometimes regurgitates.  This only occurs with solid foods. There was no clinical evidence of pharyngeal residue (i.e. multiple swallows). Pt has no difficulty with liquid, and reports that he can take pills with water and does not c/o stasis.  Discussed diet preference with pt who would like food finely cut. He typically prefers fruits. He states he would not mind not having bread.  Will initiate ground diet per pt request, but he may advance all the way to regular texture per his preference.  Consider esophageal assessment. Pt does not have any further ST needs at this time.  Recommend dysphagia 2 diet with thin liquid.  SLP Visit Diagnosis: Dysphagia, unspecified (R13.10)    Aspiration Risk  No limitations    Diet Recommendation Dysphagia 2 (Fine chop);Thin liquid   Liquid Administration via: Cup;Straw Medication Administration: Whole meds with liquid Supervision: Patient able to self feed Compensations: Slow rate;Small sips/bites Postural Changes:  (Upright as tolerated)    Other  Recommendations Recommended Consults: Consider GI evaluation;Consider esophageal assessment Oral Care Recommendations: Oral care BID    Recommendations for follow up therapy are one component of a multi-disciplinary discharge planning process, led by the attending physician.  Recommendations may be updated based on patient status, additional functional criteria and insurance authorization.  Follow up Recommendations No SLP follow up      Assistance Recommended at Discharge None  Functional Status Assessment Patient has not had a recent decline in  their functional status  Frequency and Duration  (n/a)          Prognosis Prognosis for Safe Diet Advancement: Good      Swallow Study   General Date of Onset: 11/25/21 HPI: Patient 88 year old gentleman who presented to the ED with complaints of  right hip pain and failure to thrive over the past few weeks. ED work-up concerning for dislocation of internal right hip prosthesis, acute kidney injury on CKD stage IV, failure to thrive, dysphagia. Over the past 2 weeks patient has had decreased oral intake and due to significant weakness and pain family brought him to the ED.  Patient with some complaints of dysphagia to solid foods on 6/8 but no on to liquids or pills. CXR 6/21 with no active disease.  Pt with history of CKD stage IV, HTN, hyperlipidemia, anemia of chronic disease, Type of Study: Bedside Swallow Evaluation Previous Swallow Assessment: None Diet Prior to this Study: Dysphagia 3 (soft);Thin liquids Temperature Spikes Noted: No Respiratory Status: Room air History of Recent Intubation: No Behavior/Cognition: Alert;Cooperative;Pleasant mood Oral Cavity Assessment: Within Functional Limits Oral Care Completed by SLP: No Oral Cavity - Dentition: Edentulous Vision: Functional for self-feeding Self-Feeding Abilities: Able to feed self Patient Positioning: Partially reclined (Pt could not tolerate HOB over 36 degrees) Baseline Vocal Quality: Normal Volitional Cough: Strong Volitional Swallow: Able to elicit    Oral/Motor/Sensory Function Overall Oral Motor/Sensory Function: Within functional limits Facial ROM: Within Functional Limits Facial Symmetry: Within Functional Limits Facial Strength: Within Functional Limits Lingual ROM: Within Functional Limits Lingual Symmetry: Within Functional Limits Lingual Strength: Within Functional Limits Velum: Within Functional Limits Mandible: Within Functional Limits   Ice Chips Ice chips: Not tested   Thin Liquid Thin Liquid: Within functional limits Presentation: Straw    Nectar Thick   Not tested  Honey Thick   Not tested  Puree Puree: Within functional limits Presentation: Spoon   Solid     Solid: Within functional limits Presentation: Self Fed      Kerrie Pleasure, MA,  CCC-SLP Acute Rehabilitation Services Office: [phone] 11/27/2021,10:03 AM

## 2021-11-27 NOTE — Assessment & Plan Note (Addendum)
-   Patient currently tolerating a dysphagia 2 diet.  -Ensure supplementation. -Follow.

## 2021-11-28 DIAGNOSIS — R1310 Dysphagia, unspecified: Secondary | ICD-10-CM | POA: Diagnosis not present

## 2021-11-28 DIAGNOSIS — D638 Anemia in other chronic diseases classified elsewhere: Secondary | ICD-10-CM | POA: Diagnosis not present

## 2021-11-28 DIAGNOSIS — T84020A Dislocation of internal right hip prosthesis, initial encounter: Secondary | ICD-10-CM | POA: Diagnosis not present

## 2021-11-28 DIAGNOSIS — N179 Acute kidney failure, unspecified: Secondary | ICD-10-CM | POA: Diagnosis not present

## 2021-11-28 LAB — MAGNESIUM: Magnesium: 2.1 mg/dL (ref 1.7–2.4)

## 2021-11-28 LAB — COMPREHENSIVE METABOLIC PANEL
ALT: 48 U/L — ABNORMAL HIGH (ref 0–44)
AST: 51 U/L — ABNORMAL HIGH (ref 15–41)
Albumin: 1.8 g/dL — ABNORMAL LOW (ref 3.5–5.0)
Alkaline Phosphatase: 125 U/L (ref 38–126)
Anion gap: 9 (ref 5–15)
BUN: 67 mg/dL — ABNORMAL HIGH (ref 8–23)
CO2: 20 mmol/L — ABNORMAL LOW (ref 22–32)
Calcium: 9.1 mg/dL (ref 8.9–10.3)
Chloride: 113 mmol/L — ABNORMAL HIGH (ref 98–111)
Creatinine, Ser: 2.71 mg/dL — ABNORMAL HIGH (ref 0.61–1.24)
GFR, Estimated: 21 mL/min — ABNORMAL LOW (ref >60)
Glucose, Bld: 157 mg/dL — ABNORMAL HIGH (ref 70–99)
Potassium: 3.5 mmol/L (ref 3.5–5.1)
Sodium: 142 mmol/L (ref 135–145)
Total Bilirubin: 0.3 mg/dL (ref 0.3–1.2)
Total Protein: 5.7 g/dL — ABNORMAL LOW (ref 6.5–8.1)

## 2021-11-28 LAB — CBC WITH DIFFERENTIAL/PLATELET
Abs Immature Granulocytes: 0.09 10*3/uL — ABNORMAL HIGH (ref 0.00–0.07)
Basophils Absolute: 0.1 10*3/uL (ref 0.0–0.1)
Basophils Relative: 0 %
Eosinophils Absolute: 0.1 10*3/uL (ref 0.0–0.5)
Eosinophils Relative: 0 %
HCT: 28.2 % — ABNORMAL LOW (ref 39.0–52.0)
Hemoglobin: 8.7 g/dL — ABNORMAL LOW (ref 13.0–17.0)
Immature Granulocytes: 1 %
Lymphocytes Relative: 6 %
Lymphs Abs: 0.8 10*3/uL (ref 0.7–4.0)
MCH: 25.6 pg — ABNORMAL LOW (ref 26.0–34.0)
MCHC: 30.9 g/dL (ref 30.0–36.0)
MCV: 82.9 fL (ref 80.0–100.0)
Monocytes Absolute: 1.1 10*3/uL — ABNORMAL HIGH (ref 0.1–1.0)
Monocytes Relative: 9 %
Neutro Abs: 11.2 10*3/uL — ABNORMAL HIGH (ref 1.7–7.7)
Neutrophils Relative %: 84 %
Platelets: 617 10*3/uL — ABNORMAL HIGH (ref 150–400)
RBC: 3.4 MIL/uL — ABNORMAL LOW (ref 4.22–5.81)
RDW: 17.1 % — ABNORMAL HIGH (ref 11.5–15.5)
WBC: 13.3 10*3/uL — ABNORMAL HIGH (ref 4.0–10.5)
nRBC: 0 % (ref 0.0–0.2)

## 2021-11-28 MED ORDER — SODIUM CHLORIDE 0.45 % IV SOLN: INTRAVENOUS | Status: DC

## 2021-11-28 MED ORDER — ENSURE ENLIVE PO LIQD
237.0000 mL | Freq: Three times a day (TID) | ORAL | Status: DC
  Administered 2021-11-28 – 2021-12-05 (×18): 237 mL via ORAL

## 2021-11-28 MED ORDER — SORBITOL 70 % SOLN
30.0000 mL | Status: AC
  Administered 2021-11-28 (×2): 30 mL via ORAL
  Filled 2021-11-28 (×2): qty 30

## 2021-11-28 MED ORDER — POLYETHYLENE GLYCOL 3350 17 G PO PACK
17.0000 g | PACK | Freq: Every day | ORAL | Status: DC
  Administered 2021-11-28 – 2021-12-01 (×4): 17 g via ORAL
  Filled 2021-11-28 (×4): qty 1

## 2021-11-28 MED ORDER — RENA-VITE PO TABS
1.0000 | ORAL_TABLET | Freq: Every day | ORAL | Status: DC
  Administered 2021-11-28 – 2021-12-04 (×7): 1 via ORAL
  Filled 2021-11-28 (×7): qty 1

## 2021-11-29 DIAGNOSIS — R1310 Dysphagia, unspecified: Secondary | ICD-10-CM | POA: Diagnosis not present

## 2021-11-29 DIAGNOSIS — Z515 Encounter for palliative care: Secondary | ICD-10-CM | POA: Diagnosis not present

## 2021-11-29 DIAGNOSIS — N179 Acute kidney failure, unspecified: Secondary | ICD-10-CM | POA: Diagnosis not present

## 2021-11-29 DIAGNOSIS — D638 Anemia in other chronic diseases classified elsewhere: Secondary | ICD-10-CM | POA: Diagnosis not present

## 2021-11-29 DIAGNOSIS — T84020A Dislocation of internal right hip prosthesis, initial encounter: Secondary | ICD-10-CM | POA: Diagnosis not present

## 2021-11-29 LAB — CBC
HCT: 27.5 % — ABNORMAL LOW (ref 39.0–52.0)
Hemoglobin: 8.8 g/dL — ABNORMAL LOW (ref 13.0–17.0)
MCH: 26.5 pg (ref 26.0–34.0)
MCHC: 32 g/dL (ref 30.0–36.0)
MCV: 82.8 fL (ref 80.0–100.0)
Platelets: 558 10*3/uL — ABNORMAL HIGH (ref 150–400)
RBC: 3.32 MIL/uL — ABNORMAL LOW (ref 4.22–5.81)
RDW: 17.5 % — ABNORMAL HIGH (ref 11.5–15.5)
WBC: 15.6 10*3/uL — ABNORMAL HIGH (ref 4.0–10.5)
nRBC: 0 % (ref 0.0–0.2)

## 2021-11-29 LAB — BASIC METABOLIC PANEL
Anion gap: 9 (ref 5–15)
BUN: 65 mg/dL — ABNORMAL HIGH (ref 8–23)
CO2: 19 mmol/L — ABNORMAL LOW (ref 22–32)
Calcium: 9.1 mg/dL (ref 8.9–10.3)
Chloride: 111 mmol/L (ref 98–111)
Creatinine, Ser: 2.42 mg/dL — ABNORMAL HIGH (ref 0.61–1.24)
GFR, Estimated: 24 mL/min — ABNORMAL LOW (ref >60)
Glucose, Bld: 109 mg/dL — ABNORMAL HIGH (ref 70–99)
Potassium: 3.5 mmol/L (ref 3.5–5.1)
Sodium: 139 mmol/L (ref 135–145)

## 2021-11-29 LAB — GLUCOSE, CAPILLARY: Glucose-Capillary: 106 mg/dL — ABNORMAL HIGH (ref 70–99)

## 2021-11-29 MED ORDER — POTASSIUM CHLORIDE CRYS ER 10 MEQ PO TBCR
20.0000 meq | EXTENDED_RELEASE_TABLET | Freq: Once | ORAL | Status: AC
  Administered 2021-11-29: 20 meq via ORAL
  Filled 2021-11-29: qty 2

## 2021-11-29 NOTE — Progress Notes (Signed)
Palliative Medicine Inpatient Follow Up Note   HPI: 88 y.o. male  with past medical history of glaucoma, HTN, gout, anemia due to CKD, and right hip prosthesis admitted on 11/25/2021 with right hip pain.  Patient has been diagnosed with dislocation of anterior right hip prosthesis.  Orthopedics is to evaluate but has initially recommended a nerve block.Patient is being treated for right hip pain as well as failure to thrive, renal failure, leukocytosis, and transaminitis.Palliative medicine team was consulted to discuss goals of care.  Today's Discussion 11/29/2021  *Please note that this is a verbal dictation therefore any spelling or grammatical errors are due to the "[employer]agon [employer] One" system interpretation.  Chart reviewed inclusive of vital signs, progress notes, laboratory results, and diagnostic images.   I met with Danny Rhodes this morning. He was sitting up in bed preparing to eat his breakfast. Danny Rhodes was in good spirits and shares that he had just gotten off the phone with his family member. He states that the complete picture of the orthopedic procedure which will be pursued is unknown presently as the surgeon will need to review imaging for a formal plan. I asked Danny Rhodes if he could not walk and was wheelchair bound whether or not that would be an acceptable quality of life for him. We reviewed various ways he could still be active in society even is in a wheelchair. He states that if he can move by propulsion or be brought placed, like his doctors office then yes, it would be acceptable. We reviewed that it is likely the medical specialists will have to do a procedure that will relieve pain but may leave Danny Rhodes less mobile, he shares knowledge and acceptance of this risk.   Danny Rhodes remains nutritionally to be only drinking nutritional supplements. Reviewed the importance of PO intake for healing and strength.  Questions answered and palliative support provided.   Objective  Assessment: Vital Signs Vitals:   11/29/21 0717 11/29/21 0850  BP:  (!) 133/50  Pulse:  66  Resp:  15  Temp:  98 F (36.7 C)  SpO2: 100% 100%    Intake/Output Summary (Last 24 hours) at 11/29/2021 1104 Last data filed at 11/29/2021 0600 Gross per 24 hour  Intake 77.37 ml  Output 1050 ml  Net -972.63 ml    Last Weight  Most recent update: 11/25/2021  9:48 AM    Weight  72.6 kg (160 lb)            Gen: Frail elderly African-American male in no acute distress HEENT: moist mucous membranes CV: Regular rate and rhythm PULM: On room air breathing is even and nonlabored ABD: soft/nontender EXT: No edema Neuro: Alert and oriented x3  SUMMARY OF RECOMMENDATIONS   DNAR/DNI  Possible surgery early next week for hip repair in the alleviation of discomfort --> Patient expresses even if left wheelchair bound as long as he could "get around" and be taken to Dr. Algie Coffer then this would be an acceptable quality of life  Best case worst-case scenarios reviewed  Failure to thrive: Nutrition consult, one-to-one feeding, can consider appetite stimulants such as Marinol twice daily if needed  Ongoing palliative care support incrementally  Billing based on MDM: High  ______________________________________________________________________________________ Danny Rhodes  Palliative Medicine Team Team Cell Phone: [phone] Please utilize secure chat with additional questions, if there is no response within 30 minutes please call the above phone number  Palliative Medicine Team providers are available by phone from 7am to 7pm daily and  can be reached through the team cell phone.  Should this patient require assistance outside of these hours, please call the patient's attending physician.

## 2021-11-30 DIAGNOSIS — D638 Anemia in other chronic diseases classified elsewhere: Secondary | ICD-10-CM | POA: Diagnosis not present

## 2021-11-30 DIAGNOSIS — N179 Acute kidney failure, unspecified: Secondary | ICD-10-CM | POA: Diagnosis not present

## 2021-11-30 DIAGNOSIS — T84020A Dislocation of internal right hip prosthesis, initial encounter: Secondary | ICD-10-CM | POA: Diagnosis not present

## 2021-11-30 DIAGNOSIS — R1310 Dysphagia, unspecified: Secondary | ICD-10-CM | POA: Diagnosis not present

## 2021-11-30 LAB — CBC WITH DIFFERENTIAL/PLATELET
Abs Immature Granulocytes: 0.13 10*3/uL — ABNORMAL HIGH (ref 0.00–0.07)
Basophils Absolute: 0.1 10*3/uL (ref 0.0–0.1)
Basophils Relative: 1 %
Eosinophils Absolute: 0.2 10*3/uL (ref 0.0–0.5)
Eosinophils Relative: 1 %
HCT: 29.5 % — ABNORMAL LOW (ref 39.0–52.0)
Hemoglobin: 9.3 g/dL — ABNORMAL LOW (ref 13.0–17.0)
Immature Granulocytes: 1 %
Lymphocytes Relative: 6 %
Lymphs Abs: 0.9 10*3/uL (ref 0.7–4.0)
MCH: 26.6 pg (ref 26.0–34.0)
MCHC: 31.5 g/dL (ref 30.0–36.0)
MCV: 84.5 fL (ref 80.0–100.0)
Monocytes Absolute: 1.1 10*3/uL — ABNORMAL HIGH (ref 0.1–1.0)
Monocytes Relative: 8 %
Neutro Abs: 11.8 10*3/uL — ABNORMAL HIGH (ref 1.7–7.7)
Neutrophils Relative %: 83 %
Platelets: 539 10*3/uL — ABNORMAL HIGH (ref 150–400)
RBC: 3.49 MIL/uL — ABNORMAL LOW (ref 4.22–5.81)
RDW: 17.5 % — ABNORMAL HIGH (ref 11.5–15.5)
WBC: 14.2 10*3/uL — ABNORMAL HIGH (ref 4.0–10.5)
nRBC: 0 % (ref 0.0–0.2)

## 2021-11-30 LAB — COMPREHENSIVE METABOLIC PANEL
ALT: 52 U/L — ABNORMAL HIGH (ref 0–44)
AST: 51 U/L — ABNORMAL HIGH (ref 15–41)
Albumin: 1.8 g/dL — ABNORMAL LOW (ref 3.5–5.0)
Alkaline Phosphatase: 113 U/L (ref 38–126)
Anion gap: 6 (ref 5–15)
BUN: 59 mg/dL — ABNORMAL HIGH (ref 8–23)
CO2: 21 mmol/L — ABNORMAL LOW (ref 22–32)
Calcium: 9.1 mg/dL (ref 8.9–10.3)
Chloride: 112 mmol/L — ABNORMAL HIGH (ref 98–111)
Creatinine, Ser: 2.5 mg/dL — ABNORMAL HIGH (ref 0.61–1.24)
GFR, Estimated: 24 mL/min — ABNORMAL LOW (ref >60)
Glucose, Bld: 102 mg/dL — ABNORMAL HIGH (ref 70–99)
Potassium: 3.9 mmol/L (ref 3.5–5.1)
Sodium: 139 mmol/L (ref 135–145)
Total Bilirubin: 0.4 mg/dL (ref 0.3–1.2)
Total Protein: 5.8 g/dL — ABNORMAL LOW (ref 6.5–8.1)

## 2021-12-01 ENCOUNTER — Encounter (HOSPITAL_COMMUNITY)
Admission: EM | Disposition: A | Discharge status: Home Health | Payer: Self-pay | Source: Home / Self Care | Attending: Internal Medicine

## 2021-12-01 ENCOUNTER — Other Ambulatory Visit: Payer: Self-pay

## 2021-12-01 ENCOUNTER — Inpatient Hospital Stay (HOSPITAL_COMMUNITY): Payer: Medicare Other | Admitting: Anesthesiology

## 2021-12-01 ENCOUNTER — Encounter (HOSPITAL_COMMUNITY): Payer: Self-pay | Admitting: Family Medicine

## 2021-12-01 ENCOUNTER — Inpatient Hospital Stay (HOSPITAL_COMMUNITY): Payer: Medicare Other

## 2021-12-01 DIAGNOSIS — N179 Acute kidney failure, unspecified: Secondary | ICD-10-CM | POA: Diagnosis not present

## 2021-12-01 DIAGNOSIS — T8451XA Infection and inflammatory reaction due to internal right hip prosthesis, initial encounter: Secondary | ICD-10-CM | POA: Diagnosis not present

## 2021-12-01 DIAGNOSIS — T84090A Other mechanical complication of internal right hip prosthesis, initial encounter: Secondary | ICD-10-CM

## 2021-12-01 DIAGNOSIS — Z89621 Acquired absence of right hip joint: Secondary | ICD-10-CM

## 2021-12-01 DIAGNOSIS — N289 Disorder of kidney and ureter, unspecified: Secondary | ICD-10-CM

## 2021-12-01 DIAGNOSIS — I10 Essential (primary) hypertension: Secondary | ICD-10-CM | POA: Diagnosis not present

## 2021-12-01 DIAGNOSIS — D638 Anemia in other chronic diseases classified elsewhere: Secondary | ICD-10-CM | POA: Diagnosis not present

## 2021-12-01 DIAGNOSIS — R1310 Dysphagia, unspecified: Secondary | ICD-10-CM | POA: Diagnosis not present

## 2021-12-01 DIAGNOSIS — T84020A Dislocation of internal right hip prosthesis, initial encounter: Secondary | ICD-10-CM | POA: Diagnosis not present

## 2021-12-01 HISTORY — PX: TOTAL HIP ARTHROPLASTY: SHX124

## 2021-12-01 LAB — RENAL FUNCTION PANEL
Albumin: 1.9 g/dL — ABNORMAL LOW (ref 3.5–5.0)
Anion gap: 7 (ref 5–15)
BUN: 59 mg/dL — ABNORMAL HIGH (ref 8–23)
CO2: 20 mmol/L — ABNORMAL LOW (ref 22–32)
Calcium: 9.4 mg/dL (ref 8.9–10.3)
Chloride: 112 mmol/L — ABNORMAL HIGH (ref 98–111)
Creatinine, Ser: 2.48 mg/dL — ABNORMAL HIGH (ref 0.61–1.24)
GFR, Estimated: 24 mL/min — ABNORMAL LOW (ref >60)
Glucose, Bld: 114 mg/dL — ABNORMAL HIGH (ref 70–99)
Phosphorus: 3.5 mg/dL (ref 2.5–4.6)
Potassium: 4 mmol/L (ref 3.5–5.1)
Sodium: 139 mmol/L (ref 135–145)

## 2021-12-01 LAB — CBC
HCT: 31.5 % — ABNORMAL LOW (ref 39.0–52.0)
Hemoglobin: 9.6 g/dL — ABNORMAL LOW (ref 13.0–17.0)
MCH: 26.1 pg (ref 26.0–34.0)
MCHC: 30.5 g/dL (ref 30.0–36.0)
MCV: 85.6 fL (ref 80.0–100.0)
Platelets: 520 10*3/uL — ABNORMAL HIGH (ref 150–400)
RBC: 3.68 MIL/uL — ABNORMAL LOW (ref 4.22–5.81)
RDW: 17.4 % — ABNORMAL HIGH (ref 11.5–15.5)
WBC: 13.4 10*3/uL — ABNORMAL HIGH (ref 4.0–10.5)
nRBC: 0 % (ref 0.0–0.2)

## 2021-12-01 LAB — PREPARE RBC (CROSSMATCH)

## 2021-12-01 SURGERY — ARTHROPLASTY, HIP, TOTAL,POSTERIOR APPROACH
Anesthesia: General | Site: Hip | Laterality: Right

## 2021-12-01 MED ORDER — ROCURONIUM BROMIDE 10 MG/ML (PF) SYRINGE
PREFILLED_SYRINGE | INTRAVENOUS | Status: DC | PRN
  Administered 2021-12-01: 60 mg via INTRAVENOUS
  Administered 2021-12-01: 40 mg via INTRAVENOUS

## 2021-12-01 MED ORDER — FENTANYL CITRATE (PF) 250 MCG/5ML IJ SOLN
INTRAMUSCULAR | Status: DC | PRN
  Administered 2021-12-01 (×5): 50 ug via INTRAVENOUS

## 2021-12-01 MED ORDER — TRANEXAMIC ACID-NACL 1000-0.7 MG/100ML-% IV SOLN
INTRAVENOUS | Status: DC | PRN
  Administered 2021-12-01: 1000 mg via INTRAVENOUS

## 2021-12-01 MED ORDER — MENTHOL 3 MG MT LOZG: 1.0000 | LOZENGE | OROMUCOSAL | Status: DC | PRN

## 2021-12-01 MED ORDER — ONDANSETRON HCL 4 MG PO TABS: 4.0000 mg | ORAL_TABLET | Freq: Four times a day (QID) | ORAL | Status: DC | PRN

## 2021-12-01 MED ORDER — ORAL CARE MOUTH RINSE: 15.0000 mL | Freq: Once | OROMUCOSAL | Status: DC

## 2021-12-01 MED ORDER — PHENYLEPHRINE HCL-NACL 20-0.9 MG/250ML-% IV SOLN
INTRAVENOUS | Status: DC | PRN
  Administered 2021-12-01: 20 ug/min via INTRAVENOUS

## 2021-12-01 MED ORDER — OXYCODONE HCL 5 MG PO TABS
10.0000 mg | ORAL_TABLET | ORAL | Status: DC | PRN
  Administered 2021-12-02: 10 mg via ORAL

## 2021-12-01 MED ORDER — ACETAMINOPHEN 325 MG PO TABS
325.0000 mg | ORAL_TABLET | Freq: Four times a day (QID) | ORAL | Status: DC | PRN
  Administered 2021-12-03 – 2021-12-04 (×2): 650 mg via ORAL
  Filled 2021-12-01 (×2): qty 2

## 2021-12-01 MED ORDER — DOCUSATE SODIUM 100 MG PO CAPS
100.0000 mg | ORAL_CAPSULE | Freq: Two times a day (BID) | ORAL | Status: DC
  Administered 2021-12-01 – 2021-12-05 (×8): 100 mg via ORAL
  Filled 2021-12-01 (×8): qty 1

## 2021-12-01 MED ORDER — CEFAZOLIN SODIUM-DEXTROSE 2-4 GM/100ML-% IV SOLN
2.0000 g | Freq: Four times a day (QID) | INTRAVENOUS | Status: AC
  Administered 2021-12-02 (×2): 2 g via INTRAVENOUS
  Filled 2021-12-01 (×2): qty 100

## 2021-12-01 MED ORDER — CHLORHEXIDINE GLUCONATE 0.12 % MT SOLN
15.0000 mL | Freq: Once | OROMUCOSAL | Status: AC
Start: 2021-12-01 — End: 2021-12-01

## 2021-12-01 MED ORDER — ASPIRIN 81 MG PO CHEW
81.0000 mg | CHEWABLE_TABLET | Freq: Two times a day (BID) | ORAL | Status: DC
  Administered 2021-12-01 – 2021-12-05 (×8): 81 mg via ORAL
  Filled 2021-12-01 (×8): qty 1

## 2021-12-01 MED ORDER — CEFAZOLIN SODIUM-DEXTROSE 2-4 GM/100ML-% IV SOLN
2.0000 g | INTRAVENOUS | Status: DC
  Filled 2021-12-01 (×2): qty 100

## 2021-12-01 MED ORDER — DEXAMETHASONE SODIUM PHOSPHATE 10 MG/ML IJ SOLN
INTRAMUSCULAR | Status: AC
  Filled 2021-12-01: qty 1

## 2021-12-01 MED ORDER — SODIUM CHLORIDE 0.9 % IV SOLN: 10.0000 mL/h | Freq: Once | INTRAVENOUS | Status: DC

## 2021-12-01 MED ORDER — METHOCARBAMOL 500 MG PO TABS
500.0000 mg | ORAL_TABLET | Freq: Four times a day (QID) | ORAL | Status: DC | PRN
  Administered 2021-12-02 – 2021-12-05 (×5): 500 mg via ORAL
  Filled 2021-12-01 (×5): qty 1

## 2021-12-01 MED ORDER — ONDANSETRON HCL 4 MG/2ML IJ SOLN
INTRAMUSCULAR | Status: AC
  Filled 2021-12-01: qty 2

## 2021-12-01 MED ORDER — HYDROMORPHONE HCL 1 MG/ML IJ SOLN
0.5000 mg | INTRAMUSCULAR | Status: DC | PRN
  Administered 2021-12-01 – 2021-12-04 (×2): 1 mg via INTRAVENOUS
  Filled 2021-12-01 (×2): qty 1

## 2021-12-01 MED ORDER — LACTATED RINGERS IV SOLN: INTRAVENOUS | Status: DC

## 2021-12-01 MED ORDER — CEFAZOLIN SODIUM-DEXTROSE 2-3 GM-%(50ML) IV SOLR
INTRAVENOUS | Status: DC | PRN
  Administered 2021-12-01: 2 g via INTRAVENOUS

## 2021-12-01 MED ORDER — METOCLOPRAMIDE HCL 5 MG/ML IJ SOLN: 5.0000 mg | Freq: Three times a day (TID) | INTRAMUSCULAR | Status: DC | PRN

## 2021-12-01 MED ORDER — ONDANSETRON HCL 4 MG/2ML IJ SOLN: 4.0000 mg | Freq: Four times a day (QID) | INTRAMUSCULAR | Status: DC | PRN

## 2021-12-01 MED ORDER — OXYCODONE HCL 5 MG PO TABS
5.0000 mg | ORAL_TABLET | ORAL | Status: DC | PRN
  Filled 2021-12-01: qty 2

## 2021-12-01 MED ORDER — TRANEXAMIC ACID-NACL 1000-0.7 MG/100ML-% IV SOLN
INTRAVENOUS | Status: AC
  Filled 2021-12-01: qty 100

## 2021-12-01 MED ORDER — ROCURONIUM BROMIDE 10 MG/ML (PF) SYRINGE
PREFILLED_SYRINGE | INTRAVENOUS | Status: AC
  Filled 2021-12-01: qty 30

## 2021-12-01 MED ORDER — CEFAZOLIN SODIUM-DEXTROSE 2-4 GM/100ML-% IV SOLN
INTRAVENOUS | Status: AC
  Filled 2021-12-01: qty 100

## 2021-12-01 MED ORDER — DEXAMETHASONE SODIUM PHOSPHATE 10 MG/ML IJ SOLN
10.0000 mg | Freq: Once | INTRAMUSCULAR | Status: AC
  Administered 2021-12-02: 10 mg via INTRAVENOUS
  Filled 2021-12-01: qty 1

## 2021-12-01 MED ORDER — BISACODYL 10 MG RE SUPP: 10.0000 mg | Freq: Every day | RECTAL | Status: DC | PRN

## 2021-12-01 MED ORDER — CHLORHEXIDINE GLUCONATE 0.12 % MT SOLN: 15.0000 mL | Freq: Once | OROMUCOSAL | Status: DC

## 2021-12-01 MED ORDER — TRANEXAMIC ACID-NACL 1000-0.7 MG/100ML-% IV SOLN
1000.0000 mg | Freq: Once | INTRAVENOUS | Status: AC
  Administered 2021-12-01: 1000 mg via INTRAVENOUS
  Filled 2021-12-01: qty 100

## 2021-12-01 MED ORDER — SUGAMMADEX SODIUM 200 MG/2ML IV SOLN
INTRAVENOUS | Status: DC | PRN
  Administered 2021-12-01: 290.4 mg via INTRAVENOUS

## 2021-12-01 MED ORDER — PHENOL 1.4 % MT LIQD
1.0000 | OROMUCOSAL | Status: DC | PRN
Start: 2021-12-01 — End: 2021-12-05

## 2021-12-01 MED ORDER — LIDOCAINE 2% (20 MG/ML) 5 ML SYRINGE
INTRAMUSCULAR | Status: AC
  Filled 2021-12-01: qty 10

## 2021-12-01 MED ORDER — ACETAMINOPHEN 500 MG PO TABS
1000.0000 mg | ORAL_TABLET | Freq: Four times a day (QID) | ORAL | Status: AC
  Administered 2021-12-02 (×4): 1000 mg via ORAL
  Filled 2021-12-01 (×4): qty 2

## 2021-12-01 MED ORDER — ORAL CARE MOUTH RINSE
15.0000 mL | Freq: Once | OROMUCOSAL | Status: AC
Start: 2021-12-01 — End: 2021-12-01

## 2021-12-01 MED ORDER — DEXAMETHASONE SODIUM PHOSPHATE 10 MG/ML IJ SOLN
INTRAMUSCULAR | Status: DC | PRN
  Administered 2021-12-01: 10 mg via INTRAVENOUS

## 2021-12-01 MED ORDER — DEXAMETHASONE SODIUM PHOSPHATE 10 MG/ML IJ SOLN
8.0000 mg | Freq: Once | INTRAMUSCULAR | Status: AC
  Administered 2021-12-01: 8 mg via INTRAVENOUS
  Filled 2021-12-01: qty 1

## 2021-12-01 MED ORDER — ONDANSETRON HCL 4 MG/2ML IJ SOLN
INTRAMUSCULAR | Status: DC | PRN
  Administered 2021-12-01: 4 mg via INTRAVENOUS

## 2021-12-01 MED ORDER — FENTANYL CITRATE (PF) 100 MCG/2ML IJ SOLN
INTRAMUSCULAR | Status: AC
  Filled 2021-12-01: qty 2

## 2021-12-01 MED ORDER — DIPHENHYDRAMINE HCL 12.5 MG/5ML PO ELIX: 12.5000 mg | ORAL_SOLUTION | ORAL | Status: DC | PRN

## 2021-12-01 MED ORDER — FENTANYL CITRATE (PF) 250 MCG/5ML IJ SOLN
INTRAMUSCULAR | Status: AC
  Filled 2021-12-01: qty 5

## 2021-12-01 MED ORDER — SODIUM CHLORIDE 0.9 % IR SOLN
Status: DC | PRN
  Administered 2021-12-01 (×2): 3000 mL

## 2021-12-01 MED ORDER — METOCLOPRAMIDE HCL 5 MG PO TABS: 5.0000 mg | ORAL_TABLET | Freq: Three times a day (TID) | ORAL | Status: DC | PRN

## 2021-12-01 MED ORDER — FENTANYL CITRATE (PF) 100 MCG/2ML IJ SOLN
25.0000 ug | INTRAMUSCULAR | Status: DC | PRN
  Administered 2021-12-01: 25 ug via INTRAVENOUS

## 2021-12-01 MED ORDER — 0.9 % SODIUM CHLORIDE (POUR BTL) OPTIME
TOPICAL | Status: DC | PRN
  Administered 2021-12-01: 1000 mL

## 2021-12-01 MED ORDER — PROPOFOL 10 MG/ML IV BOLUS
INTRAVENOUS | Status: DC | PRN
  Administered 2021-12-01: 100 mg via INTRAVENOUS

## 2021-12-01 MED ORDER — POLYETHYLENE GLYCOL 3350 17 G PO PACK: 17.0000 g | PACK | Freq: Every day | ORAL | Status: DC | PRN

## 2021-12-01 MED ORDER — SODIUM CHLORIDE 0.9 % IV SOLN: INTRAVENOUS | Status: DC

## 2021-12-01 MED ORDER — METHOCARBAMOL 1000 MG/10ML IJ SOLN: 500.0000 mg | Freq: Four times a day (QID) | INTRAVENOUS | Status: DC | PRN

## 2021-12-01 MED ORDER — FERROUS SULFATE 325 (65 FE) MG PO TABS
325.0000 mg | ORAL_TABLET | Freq: Three times a day (TID) | ORAL | Status: DC
  Administered 2021-12-02 – 2021-12-05 (×11): 325 mg via ORAL
  Filled 2021-12-01 (×11): qty 1

## 2021-12-01 MED ORDER — CHLORHEXIDINE GLUCONATE 0.12 % MT SOLN
OROMUCOSAL | Status: AC
  Administered 2021-12-01: 15 mL via OROMUCOSAL
  Filled 2021-12-01: qty 15

## 2021-12-01 MED ORDER — PHENYLEPHRINE 80 MCG/ML (10ML) SYRINGE FOR IV PUSH (FOR BLOOD PRESSURE SUPPORT)
PREFILLED_SYRINGE | INTRAVENOUS | Status: AC
Start: 2021-12-01 — End: ?
  Filled 2021-12-01: qty 10

## 2021-12-01 MED ORDER — TRANEXAMIC ACID-NACL 1000-0.7 MG/100ML-% IV SOLN
1000.0000 mg | INTRAVENOUS | Status: AC
  Administered 2021-12-01: 1000 mg via INTRAVENOUS
  Filled 2021-12-01: qty 100

## 2021-12-01 MED ORDER — PROPOFOL 10 MG/ML IV BOLUS
INTRAVENOUS | Status: AC
  Filled 2021-12-01: qty 20

## 2021-12-01 MED ORDER — LIDOCAINE 2% (20 MG/ML) 5 ML SYRINGE
INTRAMUSCULAR | Status: DC | PRN
  Administered 2021-12-01: 25 mg via INTRAVENOUS

## 2021-12-01 SURGICAL SUPPLY — 76 items
ADH SKN CLS APL DERMABOND .7 (GAUZE/BANDAGES/DRESSINGS) ×1
AGENT HMST KT MTR STRL THRMB (HEMOSTASIS)
APL SKNCLS STERI-STRIP NONHPOA (GAUZE/BANDAGES/DRESSINGS)
BAG COUNTER SPONGE SURGICOUNT (BAG) ×2 IMPLANT
BAG SPNG CNTER NS LX DISP (BAG) ×1
BENZOIN TINCTURE PRP APPL 2/3 (GAUZE/BANDAGES/DRESSINGS) ×1 IMPLANT
BLADE SAW SGTL 73X25 THK (BLADE) ×2 IMPLANT
BRUSH FEMORAL CANAL (MISCELLANEOUS) IMPLANT
COVER BACK TABLE 24X17X13 BIG (DRAPES) IMPLANT
DERMABOND ADVANCED (GAUZE/BANDAGES/DRESSINGS) ×1
DERMABOND ADVANCED .7 DNX12 (GAUZE/BANDAGES/DRESSINGS) IMPLANT
DRAPE IMP U-DRAPE 54X76 (DRAPES) ×2 IMPLANT
DRAPE INCISE IOBAN 66X45 STRL (DRAPES) IMPLANT
DRAPE INCISE IOBAN 85X60 (DRAPES) ×2 IMPLANT
DRAPE ORTHO SPLIT 77X108 STRL (DRAPES) ×4
DRAPE SURG ORHT 6 SPLT 77X108 (DRAPES) ×2 IMPLANT
DRAPE U-SHAPE 47X51 STRL (DRAPES) ×2 IMPLANT
DRSG AQUACEL AG ADV 3.5X14 (GAUZE/BANDAGES/DRESSINGS) ×1 IMPLANT
DURAPREP 26ML APPLICATOR (WOUND CARE) ×2 IMPLANT
ELECT BLADE 4.0 EZ CLEAN MEGAD (MISCELLANEOUS) ×2
ELECT REM PT RETURN 9FT ADLT (ELECTROSURGICAL) ×2
ELECTRODE BLDE 4.0 EZ CLN MEGD (MISCELLANEOUS) ×1 IMPLANT
ELECTRODE REM PT RTRN 9FT ADLT (ELECTROSURGICAL) ×1 IMPLANT
EVACUATOR 1/8 PVC DRAIN (DRAIN) IMPLANT
FACESHIELD WRAPAROUND (MASK) ×4 IMPLANT
FACESHIELD WRAPAROUND OR TEAM (MASK) ×2 IMPLANT
GLOVE BIOGEL PI IND STRL 7.5 (GLOVE) ×1 IMPLANT
GLOVE BIOGEL PI IND STRL 8 (GLOVE) ×2 IMPLANT
GLOVE BIOGEL PI INDICATOR 7.5 (GLOVE) ×1
GLOVE BIOGEL PI INDICATOR 8 (GLOVE) ×2
GLOVE ECLIPSE 8.0 STRL XLNG CF (GLOVE) ×2 IMPLANT
GLOVE ORTHO TXT STRL SZ7.5 (GLOVE) ×2 IMPLANT
GLOVE SURG ENC MOIS LTX SZ6 (GLOVE) ×4 IMPLANT
GLOVE SURG ORTHO 8.0 STRL STRW (GLOVE) ×2 IMPLANT
GLOVE SURG UNDER POLY LF SZ6.5 (GLOVE) ×2 IMPLANT
GOWN STRL REIN 3XL XLG LVL4 (GOWN DISPOSABLE) ×2 IMPLANT
GOWN STRL REUS W/ TWL LRG LVL3 (GOWN DISPOSABLE) ×1 IMPLANT
GOWN STRL REUS W/TWL LRG LVL3 (GOWN DISPOSABLE) ×2
HANDPIECE INTERPULSE COAX TIP (DISPOSABLE) ×2
IMMOBILIZER KNEE 20 (SOFTGOODS) IMPLANT
IMMOBILIZER KNEE 20 THIGH 36 (SOFTGOODS) IMPLANT
IMMOBILIZER KNEE 22 UNIV (SOFTGOODS) IMPLANT
IMMOBILIZER KNEE 24 THIGH 36 (MISCELLANEOUS) IMPLANT
IMMOBILIZER KNEE 24 UNIV (MISCELLANEOUS) IMPLANT
KIT BASIN OR (CUSTOM PROCEDURE TRAY) ×2 IMPLANT
KIT TURNOVER KIT B (KITS) ×2 IMPLANT
MANIFOLD NEPTUNE II (INSTRUMENTS) ×2 IMPLANT
NS IRRIG 1000ML POUR BTL (IV SOLUTION) ×2 IMPLANT
PACK TOTAL JOINT (CUSTOM PROCEDURE TRAY) ×2 IMPLANT
PACK UNIVERSAL I (CUSTOM PROCEDURE TRAY) ×2 IMPLANT
PAD ARMBOARD 7.5X6 YLW CONV (MISCELLANEOUS) ×4 IMPLANT
PRESSURIZER FEMORAL UNIV (MISCELLANEOUS) IMPLANT
SET HNDPC FAN SPRY TIP SCT (DISPOSABLE) IMPLANT
SOLUTION PRONTOSAN WOUND 350ML (IRRIGATION / IRRIGATOR) ×1 IMPLANT
SPONGE T-LAP 18X18 ~~LOC~~+RFID (SPONGE) ×4 IMPLANT
SPONGE T-LAP 4X18 ~~LOC~~+RFID (SPONGE) ×1 IMPLANT
STAPLER VISISTAT 35W (STAPLE) ×1 IMPLANT
STRIP CLOSURE SKIN 1/2X4 (GAUZE/BANDAGES/DRESSINGS) ×2 IMPLANT
SUCTION FRAZIER HANDLE 10FR (MISCELLANEOUS)
SUCTION TUBE FRAZIER 10FR DISP (MISCELLANEOUS) ×1 IMPLANT
SURGIFLO W/THROMBIN 8M KIT (HEMOSTASIS) ×1 IMPLANT
SUT ETHIBOND NAB CT1 #1 30IN (SUTURE) ×2 IMPLANT
SUT MNCRL AB 3-0 PS2 18 (SUTURE) ×3 IMPLANT
SUT VIC AB 0 CT1 27 (SUTURE) ×2
SUT VIC AB 0 CT1 27XBRD ANBCTR (SUTURE) ×2 IMPLANT
SUT VIC AB 1 CT1 27 (SUTURE) ×4
SUT VIC AB 1 CT1 27XBRD ANBCTR (SUTURE) ×2 IMPLANT
SUT VIC AB 2-0 CT1 27 (SUTURE) ×6
SUT VIC AB 2-0 CT1 TAPERPNT 27 (SUTURE) ×1 IMPLANT
TOWEL GREEN STERILE (TOWEL DISPOSABLE) ×2 IMPLANT
TOWEL GREEN STERILE FF (TOWEL DISPOSABLE) ×2 IMPLANT
TOWER CARTRIDGE SMART MIX (DISPOSABLE) IMPLANT
TRAY CATH 16FR W/PLASTIC CATH (SET/KITS/TRAYS/PACK) IMPLANT
TRAY FOLEY MTR SLVR 16FR STAT (SET/KITS/TRAYS/PACK) IMPLANT
TUBE SUCT ARGYLE STRL (TUBING) ×1 IMPLANT
WATER STERILE IRR 1000ML POUR (IV SOLUTION) ×3 IMPLANT

## 2021-12-01 NOTE — Op Note (Signed)
Danny Rhodes, EDELSON MEDICAL RECORD NO: [id number] ACCOUNT NO: [id number] DATE OF BIRTH: [birth year] FACILITY: [facility] LOCATION: [facility] PHYSICIAN: Pietro Cassis. 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  POSTOPERATIVE DIAGNOSIS:  Combined septic and aseptic failure, right total hip arthroplasty.  PROCEDURE:   1.  Resection right total hip arthroplasty, Girdlestone procedure. 2.  Excisional and non-excisional debridement of right hip.  The excisional portion was carried out over an 8-inch incision excising skin, subcutaneous tissue and subsequent significant debridement of nonviable tissue around the right hip including  muscle, tendon and bone capsule.   3.  Non-excisional debridement was carried out with 6 liters of normal saline solution with pulse lavage for approximately 350 mL of the Prontosan antimicrobial solution.     SURGEON:  Pietro Cassis. Alvan Dame, MD  ASSISTANT:  Costella Hatcher, PA-C.  Note that Ms. Lu Duffel was present for the entirety of the case from preoperative positioning, perioperative management of the operative extremity, general facilitation of the case and primary wound closure.  ANESTHESIA:  General.  BLOOD LOSS:  About 800 mL.  DRAINS:  None.  SPECIMEN:  Upon entry into the joint identified purulence, 10 mL syringe was obtained of purulent fluid to send to pathology for examination, Gram stain and culture.  DRAINS:  None.  INDICATIONS FOR THE PROCEDURE:  The patient is a very pleasant 88 year old male with longstanding history of right hip arthroplasty.  He began with a patient of mine in 2008 where he necessitated a revision of his acetabular component.  He was last seen  in the office in 2019 with some concerns of pain.  Radiographic workup it indicated stable acetabular component with some concerns about bone scan of loosening of his  femoral component.  We opted at that point to manage it conservatively and observe as  opposed to going to surgery based on his age and medical comorbidities.  He had not been seen until he showed up in the Emergency Room with increasing pain in his right hip.  Radiographs revealed concerns for dislocation of his arthroplasty with  significant change in the position of his acetabular shell.  He had obvious acetabular protrusio.  CT scan revealed significant destruction to the acetabular pelvis with significant fluid collections noted and recognized to be consistent with either  particulate disease versus infection.  Indications for surgery were set.  Based on his age, based on the quality of his bone and the nature of protrusio and the destruction of the right hemipelvis I discussed with the patient and his family that his best  option at this point was to perform a resection of his hip without attempt to try and reconstruct his pelvis based on the lack of bone stock particularly in the potential study of infection.  We reviewed the risks of the surgery, which probably were  related to the comorbidities associated with not having a hip replacement in place.  Risks of neurovascular injury and DVT discussed.  Consent was obtained for benefit of management of this complex condition predominantly for the management of pain  control and addressing any other findings.  DESCRIPTION OF PROCEDURE:  The patient was brought to the operative theater.  Once adequate anesthesia, preoperative antibiotics, Ancef administered, he was positioned into the left lateral decubitus position with the right hip up.  The right lower  extremity was then prepped and draped in sterile  fashion.  A timeout was performed identifying the patient, planned procedure, and extremity.  His old incision was marked in its entirety from the posterior aspect all the way along the lateral aspect of  his femur from prior procedures.  I used a  portion of the posterior approach with the anticipation that I would be able to do all the procedure through this area.  Soft tissue dissection was carried through the skin excising the skin.  We identified the  edematous soft subcutaneous fat.  I then opened up the iliotibial band to expose the posterior aspect of the hip.  As we dissected out the posterior aspect of the trochanter as a landmark identified #1 the greater trochanter was disassociated from the  proximal femur.  We also as we went through this posterior capsular layer identified and encountered a significant amount of purulent fluid.  At this point, I took a 10 mL syringe and aspirated this fluid and sent it off to pathology for Gram stain and  culture.  At this point, a significant portion of the procedure was carried out excising nonviable tissue capsule, muscle, bone, all of the posterior aspect of the hip.  Once I had the posterior two-thirds of the hip exposed, we did identify the  acetabular shell was completely loose.  We then flexed the hip and internally rotated it.  I was able to pass some osteotomes along the proximal aspect of the femoral component, identified that it was loose.  We used the S-ROM loop extractor and removed  the femoral stem.  There was significant particulate debris around the proximal aspect of the femur with significant destruction of the proximal femur, particularly on the medial aspect of the femur proximally.  With the femoral component out, this  allowed for further exposure of the acetabular cup.  Further debridement around the acetabulum including some bone superiorly as well as significant nonviable tissue, I was able to eventually get to the point where I was able to remove the large revised  acetabular shell.  There was 1 screw intact.  There was another screw, which appeared to have broken off some time ago and about the 12 o'clock position.  I used a 1/4-inch osteotome to work around the screw threads  and was able to remove this.  I used a  very large curette and carefully debrided around the bone of the acetabulum, removing nonviable tissue.  We identified medially that the medial wall of the acetabulum was completely destroyed in about 3-inch diameter or more.  There was also a defect  inferior to this that went down into the deep retroperitoneal region.  During the dissections we encountered several areas of purulent looking fluid in addition to the significant findings of aseptic debris.  After a significant debridement of the whole  hip girdle area, I then irrigated the hip with two bags of 3 liters of normal saline solution with pulse lavage.  We then used 350 mL of Prontosan antimicrobial solution and kept this in the wound for some time as we began to close.  At this point, the  fluid was removed.  The iliotibial band and gluteal fascia were reapproximated using #1 Vicryl.  The remainder of the wound was closed in layers with 2-0 Vicryl and a running Monocryl stitch.  The hip was then cleaned, dried and dressed sterilely with  surgical glue and Aquacel dressing.  No drain was placed.  He will be touchdown weightbearing predominantly for balance only.  He  will need transfers only on his left lower extremity using his upper body for strength from bed to chair.  We may very well do a postoperative followup CT scan to identify any further fluid collections to assess and to have as comparison to monitor for recurrence of infection otherwise.  We will await the culture results.  He may very well require 4-6 weeks  of IV antibiotics.  We will have the medical team address this and perhaps consult Infectious Disease for management recommendations now that he has his arthroplasty removed.  It will be important to note that there was at this point no plan to place a  hip replacement in his hip based on the significant destruction of his acetabulum.     SUJ D: 12/01/2021 6:26:20 pm T: 12/01/2021  10:34:00 pm  JOB: [id number]/ [id number]

## 2021-12-01 NOTE — Progress Notes (Signed)
PROGRESS NOTE    Danny Rhodes  ZOX:[id number] DOB: 1927-02-23 DOA: 11/25/2021 PCP: Pearson Grippe, MD    Chief Complaint  Patient presents with   Failure To Thrive    Brief Narrative:  Patient is 88 year old gentleman history of CKD stage IV, HTN, hyperlipidemia, anemia of chronic disease, presented to the ED with complaints of right hip pain and failure to thrive over the past few weeks.  Patient noted to have had right hip pain for about the past 5 years that has progressively been getting worse and started to use a cane about 1.5 years ago and as well as a walker about a year ago.  Patient noted to have woken up on June 8 with severe pain and chills not feeling well and no appetite.  Patient denies any falls or trauma.  Tylenol did not help his pain.  Tramadol noted to have eased it but did not take the pain away.  Over the past 2 weeks patient has had decreased oral intake and due to significant weakness and pain family brought him to the ED.  Patient with some complaints of dysphagia to solid foods on 6 /8 but no on to liquids or pills.  Patient seen in the ED work-up concerning for dislocation of internal right hip prosthesis, acute kidney injury on CKD stage IV, failure to thrive, dysphagia.  Orthopedics consulted.  Speech therapy consulted.    Assessment & Plan:  Principal Problem:   Dislocation of internal right hip prosthesis, initial encounter (HCC) Active Problems:   Acute renal failure superimposed on stage 4 chronic kidney disease (HCC)   FTT (failure to thrive) in adult   Dysphagia   Leukocytosis   Transaminitis   Essential hypertension   History of anemia due to chronic kidney disease   Gout   Failure to thrive in adult   Protein-calorie malnutrition, severe    Assessment and Plan: * Dislocation of internal right hip prosthesis, initial encounter (HCC) 88 year old male with history of right hip prosthetic with worsening pain over the past 5 years with increased pain  over the past 2 weeks found to have dislocation of his prosthesis and has likely outlived it -ortho consulted and patient seen by Dr. Charlann Boxer who reviewed films, assessed patient, recommending CT scan for further evaluation.   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  Bone extensively destroyed and prostheses are loose and there are large associated soft tissue masses (pseudotumor), right femoral prosthesis dislocated, one of the pseudotumors projects into the pelvis and has significant mass effect on the rectum and bladder.  Also contains gas and could be secondary infected.  Very large right inguinal hernia containing bowel. -Orthopedics at this point recommending surgical intervention for pain management/palliation and recommending touchdown weightbearing on right lower extremity as tolerated with PT pending CT review per orthopedics.   -Patient seen by orthopedics and patient for Girdlestone procedure today 12/01/2021. -Continue current pain management with Norco and Dilaudid as needed with CKD. -Continue scheduled Tylenol. -SCDs. -PT/OT. -Per orthopedics.  Acute renal failure superimposed on stage 4 chronic kidney disease (HCC) Likely due to prerenal azotemia secondary to FTT with poor PO intake over the past 2 weeks, no hx of exogenous losses or urinary complaints Strict I/O Urinalysis unremarkable. -Urine sodium 63, urine creatinine 46.09 Bladder scan with no urine  -Baseline creatinine approximately 2.3-2.5. -Renal function slowly trending down with creatinine currently at baseline at 2.48 from 2.50 from 2.42  from 2.71 from 2.95 from 4.06 on admission. -Urine output recorded of 800 cc over the past 24 hours. -IV fluids have been saline locked. -Preop IV fluids ordered per orthopedics. Hold/avoid nephrotoxic drugs. Palliative care consulted and following.  FTT (failure to thrive) in adult History of weight loss, poor PO  intake and overall FTT Nutrition consult, palliative care consult, SLP eval and patient currently on a dysphagia 2 diet.  Dysphagia New complaint since June 8th. Can not differentiate if oral vs. Pharyngeal/esophageal Good laryngeal elevation with no s/s of aspiration Can swallow liquids fine and grapefruit -States he feels the food balls up in his mouth and just does not go down. -SLP has evaluated the patient and patient to be placed on a dysphagia 2 diet.   -Per SLP patient sometimes feels food may get regurgitated.   -Barium esophagram done with mild intermittent esophageal dysmotility with tertiary contractions, small hiatal hernia, small volume GERD observed at the level of lower esophagus otherwise unremarkable esophagram.  -Patient tolerating current diet.  -Continue dysphagia 2 diet. -Continue PPI.   Leukocytosis No signs of symptoms of infection No fever/chills -Likely reactive versus hemoconcentrated. -Chest x-ray negative for any acute cardiopulmonary disease. -Urinalysis unremarkable. -Leukocytosis fluctuating.   -Patient afebrile.   -Follow.  Transaminitis Mildly elevated History of not feeling well, fatigue, COVID test negative.  Gentle IVF. -LFTs trending down. -Follow with hydration.  Essential hypertension Controlled on current regimen of Norvasc, Coreg.   History of anemia due to chronic kidney disease Baseline around 10, -Hemoglobin at seems to be stabilizing at 9.6 today, likely dilutional component as patient with no overt bleeding. -Follow H&H. -Transfusion threshold hemoglobin < 7. Continue to monitor   Gout - Stable.   -No acute flare.   -Allopurinol.   Protein-calorie malnutrition, severe - Patient currently tolerating a dysphagia 2 diet.  -Ensure supplementation. -Follow.         DVT prophylaxis: DVT prophylaxis per orthopedics. Code Status: DNR Family Communication: Updated patient.  No family at bedside. Disposition:  TBD  Status is: Inpatient Remains inpatient appropriate because: Severity of illness   Consultants:  Orthopedics: Earney Hamburg, PA 11/25/2021 Palliative care: Dr. Phillips Odor 11/26/2021  Procedures:  Chest x-ray 11/25/2021 Plain films of the right hip and pelvis 11/25/2021 Barium esophagram 11/27/2021 CT right hip 11/27/2021   Antimicrobials:  None   Subjective: Seen laying in bed.  Nephew and niece at bedside.  No chest pain.  No shortness of breath.  Tolerated diet yesterday.  States he is currently n.p.o. awaiting surgery today.  Having bowel movements.   Objective: Vitals:   11/30/21 1732 11/30/21 2017 12/01/21 0457 12/01/21 0726  BP: (!) 142/56 (!) 153/55 (!) 157/48 (!) 129/46  Pulse: 81 74 72 66  Resp: 18 18 18    Temp:  100.1 F (37.8 C) 98.2 F (36.8 C) 97.9 F (36.6 C)  TempSrc:  Oral Oral Oral  SpO2: 100% 99% 100% 100%  Weight:      Height:        Intake/Output Summary (Last 24 hours) at 12/01/2021 1019 Last data filed at 12/01/2021 0458 Gross per 24 hour  Intake --  Output 800 ml  Net -800 ml   Filed Weights   11/25/21 0947  Weight: 72.6 kg    Examination:  General exam: NAD Respiratory system: Clear to auscultation anterior lung fields.  No wheezes, no crackles, no rhonchi.  Fair air movement.  Speaking in full sentences.  Cardiovascular system: Regular rate rhythm no murmurs rubs  or gallops.  No JVD.  No lower extremity edema.   Gastrointestinal system: Abdomen is soft, nontender, nondistended, positive bowel sounds.  No rebound.  No guarding. Central nervous system: Alert and oriented. No focal neurological deficits. Extremities: Right lower extremity externally rotated and shortened.  Significant tenderness to palpation right hip area.  No edema.  Heel floaters on. Skin: No rashes, lesions or ulcers Psychiatry: Judgement and insight appear normal. Mood & affect appropriate.     Data Reviewed:   CBC: Recent Labs  Lab 11/25/21 1408  11/26/21 0229 11/27/21 0218 11/28/21 0208 11/29/21 0352 11/30/21 0459 12/01/21 0631  WBC 17.0*   < > 14.2* 13.3* 15.6* 14.2* 13.4*  NEUTROABS 15.0*  --   --  11.2*  --  11.8*  --   HGB 10.2*   < > 10.0* 8.7* 8.8* 9.3* 9.6*  HCT 33.4*   < > 30.6* 28.2* 27.5* 29.5* 31.5*  MCV 84.1   < > 82.7 82.9 82.8 84.5 85.6  PLT 705*   < > 644* 617* 558* 539* 520*   < > = values in this interval not displayed.    Basic Metabolic Panel: Recent Labs  Lab 11/27/21 0218 11/28/21 0208 11/29/21 0352 11/30/21 0459 12/01/21 0631  NA 140 142 139 139 139  K 3.7 3.5 3.5 3.9 4.0  CL 110 113* 111 112* 112*  CO2 20* 20* 19* 21* 20*  GLUCOSE 111* 157* 109* 102* 114*  BUN 78* 67* 65* 59* 59*  CREATININE 2.95* 2.71* 2.42* 2.50* 2.48*  CALCIUM 9.5 9.1 9.1 9.1 9.4  MG 2.4 2.1  --   --   --   PHOS 4.5  --   --   --  3.5    GFR: Estimated Creatinine Clearance: 17.2 mL/min (A) (by C-G formula based on SCr of 2.48 mg/dL (H)).  Liver Function Tests: Recent Labs  Lab 11/25/21 1408 11/26/21 0229 11/27/21 0218 11/28/21 0208 11/30/21 0459 12/01/21 0631  AST 73* 53* 42* 51* 51*  --   ALT 81* 65* 50* 48* 52*  --   ALKPHOS 185* 161* 139* 125 113  --   BILITOT 0.8 0.7 0.8 0.3 0.4  --   PROT 7.9 7.3 6.5 5.7* 5.8*  --   ALBUMIN 2.7* 2.4* 2.1* 1.8* 1.8* 1.9*    CBG: Recent Labs  Lab 11/29/21 0848  GLUCAP 106*     Recent Results (from the past 240 hour(s))  SARS Coronavirus 2 by RT PCR (hospital order, performed in [facility] hospital lab) *cepheid single result test* Urine, Clean Catch     Status: None   Collection Time: 11/25/21  4:42 PM   Specimen: Urine, Clean Catch; Nasal Swab  Result Value Ref Range Status   SARS Coronavirus 2 by RT PCR NEGATIVE NEGATIVE Final    Comment: (NOTE) SARS-CoV-2 target nucleic acids are NOT DETECTED.  The SARS-CoV-2 RNA is generally detectable in upper and lower respiratory specimens during the acute phase of infection. The lowest concentration of SARS-CoV-2  viral copies this assay can detect is 250 copies / mL. A negative result does not preclude SARS-CoV-2 infection and should not be used as the sole basis for treatment or other patient management decisions.  A negative result may occur with improper specimen collection / handling, submission of specimen other than nasopharyngeal swab, presence of viral mutation(s) within the areas targeted by this assay, and inadequate number of viral copies (<250 copies / mL). A negative result must be combined with clinical observations, patient  history, and epidemiological information.  Fact Sheet for Patients:   RoadLapTop.co.za  Fact Sheet for Healthcare Providers: http://kim-miller.com/  This test is not yet approved or  cleared by the [place] FDA and has been authorized for detection and/or diagnosis of SARS-CoV-2 by FDA under an Emergency Use Authorization (EUA).  This EUA will remain in effect (meaning this test can be used) for the duration of the COVID-19 declaration under Section 564(b)(1) of the Act, 21 U.S.C. section 360bbb-3(b)(1), unless the authorization is terminated or revoked sooner.  Performed at [facility], [address], [place], [place] [address]   Surgical PCR screen     Status: None   Collection Time: 11/26/21  7:12 AM   Specimen: Nasal Mucosa; Nasal Swab  Result Value Ref Range Status   MRSA, PCR NEGATIVE NEGATIVE Final   Staphylococcus aureus NEGATIVE NEGATIVE Final    Comment: (NOTE) The Xpert SA Assay (FDA approved for NASAL specimens in patients 68 years of age and older), is one component of a comprehensive surveillance program. It is not intended to diagnose infection nor to guide or monitor treatment. Performed at [facility], [address], [place], [place] [address]          Radiology Studies: No results found.      Scheduled Meds:  acetaminophen  500 mg Oral TID   allopurinol   100 mg Oral q morning   amLODipine  10 mg Oral q morning   carvedilol  3.125 mg Oral BID WC   dorzolamide  1 drop Both Eyes BID   feeding supplement  237 mL Oral TID BM   latanoprost  1 drop Both Eyes QHS   multivitamin  1 tablet Oral QHS   pantoprazole  40 mg Oral Q0600   pneumococcal 20-valent conjugate vaccine  0.5 mL Intramuscular Tomorrow-1000   polyethylene glycol  17 g Oral Daily   senna-docusate  1 tablet Oral BID   Continuous Infusions:   ceFAZolin (ANCEF) IV     lactated ringers 75 mL/hr at 12/01/21 0981       LOS: 6 days    Time spent: 35 minutes    Ramiro Harvest, MD [facility]   To contact the attending provider between 7A-7P or the covering provider during after hours 7P-7A, please log into the web site [email or web address] and access using universal [place] password for that web site. If you do not have the password, please call the hospital operator.  12/01/2021, 10:19 AM

## 2021-12-01 NOTE — Progress Notes (Signed)
Patient ID: Danny Rhodes, male   DOB: [birth year], 88 y.o.   MRN: [id number]  Comfortable Ready for OR  Hgb - 9.6 WBC - 13.4  Albumin - 1.9 Cr - 2.48  Failed right total hip replacement  To OR today for a resection arthroplasty/girdlestone due to extremely poor bone quality, condition NPO Post op plan to follow

## 2021-12-01 NOTE — Brief Op Note (Signed)
11/25/2021 - 12/01/2021  4:13 PM  PATIENT:  Danny Rhodes  88 y.o. male  PRE-OPERATIVE DIAGNOSIS:  FAILED RIGHT TOTAL HIP ARTHROPLASTY secondary to combination of septic and aseptic conditions  POST-OPERATIVE DIAGNOSIS:  Combined septic and aseptic failure of RIGHT TOTAL HIP ARTHROPLASTY   PROCEDURE:  Procedure(s): Resection arthroplasty right total HIP ARTHROPLASTY   SURGEON:  Surgeon(s) and Role:    Durene Romans, MD - Primary  PHYSICIAN ASSISTANT: Rosalene Billings, PA-C  ANESTHESIA:   general  EBL:  800 cc  BLOOD ADMINISTERED:none  DRAINS: none   LOCAL MEDICATIONS USED:  NONE  SPECIMEN:  Source of Specimen:  right hip fluid  DISPOSITION OF SPECIMEN:  PATHOLOGY  COUNTS:  YES  TOURNIQUET:  * No tourniquets in log *  DICTATION: .Other Dictation: Dictation Number [id number]  PLAN OF CARE: Admit to inpatient   PATIENT DISPOSITION:  PACU - hemodynamically stable.   Delay start of Pharmacological VTE agent (>24hrs) due to surgical blood loss or risk of bleeding: no

## 2021-12-02 ENCOUNTER — Encounter (HOSPITAL_COMMUNITY): Payer: Self-pay | Admitting: Orthopedic Surgery

## 2021-12-02 DIAGNOSIS — Z89621 Acquired absence of right hip joint: Secondary | ICD-10-CM

## 2021-12-02 DIAGNOSIS — T84020A Dislocation of internal right hip prosthesis, initial encounter: Secondary | ICD-10-CM | POA: Diagnosis not present

## 2021-12-02 LAB — TYPE AND SCREEN
ABO/RH(D): O NEG
Antibody Screen: NEGATIVE
Unit division: 0
Unit division: 0

## 2021-12-02 LAB — COMPREHENSIVE METABOLIC PANEL
ALT: 36 U/L (ref 0–44)
AST: 34 U/L (ref 15–41)
Albumin: 1.8 g/dL — ABNORMAL LOW (ref 3.5–5.0)
Alkaline Phosphatase: 88 U/L (ref 38–126)
Anion gap: 7 (ref 5–15)
BUN: 68 mg/dL — ABNORMAL HIGH (ref 8–23)
CO2: 18 mmol/L — ABNORMAL LOW (ref 22–32)
Calcium: 8.6 mg/dL — ABNORMAL LOW (ref 8.9–10.3)
Chloride: 114 mmol/L — ABNORMAL HIGH (ref 98–111)
Creatinine, Ser: 2.68 mg/dL — ABNORMAL HIGH (ref 0.61–1.24)
GFR, Estimated: 22 mL/min — ABNORMAL LOW (ref >60)
Glucose, Bld: 149 mg/dL — ABNORMAL HIGH (ref 70–99)
Potassium: 4.9 mmol/L (ref 3.5–5.1)
Sodium: 139 mmol/L (ref 135–145)
Total Bilirubin: 0.7 mg/dL (ref 0.3–1.2)
Total Protein: 5.3 g/dL — ABNORMAL LOW (ref 6.5–8.1)

## 2021-12-02 LAB — CBC WITH DIFFERENTIAL/PLATELET
Abs Immature Granulocytes: 0.61 10*3/uL — ABNORMAL HIGH (ref 0.00–0.07)
Basophils Absolute: 0 10*3/uL (ref 0.0–0.1)
Basophils Relative: 0 %
Eosinophils Absolute: 0 10*3/uL (ref 0.0–0.5)
Eosinophils Relative: 0 %
HCT: 34.9 % — ABNORMAL LOW (ref 39.0–52.0)
Hemoglobin: 11.2 g/dL — ABNORMAL LOW (ref 13.0–17.0)
Immature Granulocytes: 2 %
Lymphocytes Relative: 3 %
Lymphs Abs: 0.9 10*3/uL (ref 0.7–4.0)
MCH: 27.7 pg (ref 26.0–34.0)
MCHC: 32.1 g/dL (ref 30.0–36.0)
MCV: 86.2 fL (ref 80.0–100.0)
Monocytes Absolute: 0.8 10*3/uL (ref 0.1–1.0)
Monocytes Relative: 3 %
Neutro Abs: 30.4 10*3/uL — ABNORMAL HIGH (ref 1.7–7.7)
Neutrophils Relative %: 92 %
Platelets: 460 10*3/uL — ABNORMAL HIGH (ref 150–400)
RBC: 4.05 MIL/uL — ABNORMAL LOW (ref 4.22–5.81)
RDW: 16.7 % — ABNORMAL HIGH (ref 11.5–15.5)
Smear Review: NORMAL
WBC: 32.8 10*3/uL — ABNORMAL HIGH (ref 4.0–10.5)
nRBC: 0 % (ref 0.0–0.2)

## 2021-12-02 LAB — BPAM RBC
Blood Product Expiration Date: 202307152359
Blood Product Expiration Date: 202307172359
ISSUE DATE / TIME: 202306271651
ISSUE DATE / TIME: 202306271651
Unit Type and Rh: 9500
Unit Type and Rh: 9500

## 2021-12-02 LAB — MAGNESIUM: Magnesium: 2.3 mg/dL (ref 1.7–2.4)

## 2021-12-02 MED ORDER — SODIUM CHLORIDE 0.9 % IV SOLN
8.0000 mg/kg | INTRAVENOUS | Status: DC
  Filled 2021-12-02: qty 12

## 2021-12-02 MED ORDER — OXYCODONE HCL 5 MG PO TABS
10.0000 mg | ORAL_TABLET | ORAL | Status: DC | PRN
  Administered 2021-12-03 – 2021-12-05 (×4): 10 mg via ORAL
  Filled 2021-12-02 (×4): qty 2

## 2021-12-02 MED ORDER — SODIUM CHLORIDE 0.9 % IV SOLN
8.0000 mg/kg | Freq: Once | INTRAVENOUS | Status: AC
  Administered 2021-12-02: 600 mg via INTRAVENOUS
  Filled 2021-12-02: qty 12

## 2021-12-02 MED ORDER — SODIUM CHLORIDE 0.9 % IV SOLN
2.0000 g | Freq: Every day | INTRAVENOUS | Status: DC
  Administered 2021-12-02 – 2021-12-05 (×4): 2 g via INTRAVENOUS
  Filled 2021-12-02 (×4): qty 20

## 2021-12-02 MED ORDER — OXYCODONE HCL 5 MG PO TABS
5.0000 mg | ORAL_TABLET | ORAL | Status: DC | PRN
  Administered 2021-12-02 – 2021-12-05 (×4): 5 mg via ORAL
  Filled 2021-12-02 (×4): qty 1

## 2021-12-02 NOTE — Progress Notes (Signed)
   Subjective: 1 Day Post-Op Procedure(s) (LRB): EXCISIONAL TOTAL HIP ARTHROPLASTY WITH POSTERIOR APPROACH (Right) Patient reports pain as mild.   Patient seen in rounds for Dr. Alvan Dame. Patient is resting in bed on exam today. He is very pleasant and in good spirits this morning. He comments on his thankfulness for the staff here. He tells me he is fairly comfortable at rest, but has not been up. No acute events overnight. External catheter in place.    Objective: Vital signs in last 24 hours: Temp:  [97.9 F (36.6 C)-98.4 F (36.9 C)] 98.2 F (36.8 C) (06/28 0715) Pulse Rate:  [50-72] 60 (06/28 0427) Resp:  [11-16] 14 (06/28 0427) BP: (99-152)/(44-70) 119/44 (06/28 0715) SpO2:  [98 %-100 %] 100 % (06/28 0715)  Intake/Output from previous day:  Intake/Output Summary (Last 24 hours) at 12/02/2021 0723 Last data filed at 12/02/2021 0432 Gross per 24 hour  Intake 2318.03 ml  Output 1600 ml  Net 718.03 ml     Intake/Output this shift: No intake/output data recorded.  Labs: Recent Labs    11/30/21 0459 12/01/21 0631 12/02/21 0248  HGB 9.3* 9.6* 11.2*   Recent Labs    12/01/21 0631 12/02/21 0248  WBC 13.4* 32.8*  RBC 3.68* 4.05*  HCT 31.5* 34.9*  PLT 520* 460*   Recent Labs    12/01/21 0631 12/02/21 0248  NA 139 139  K 4.0 4.9  CL 112* 114*  CO2 20* 18*  BUN 59* 68*  CREATININE 2.48* 2.68*  GLUCOSE 114* 149*  CALCIUM 9.4 8.6*   No results for input(s): "LABPT", "INR" in the last 72 hours.  Exam: General - Patient is Alert and Oriented Extremity - Neurologically intact Sensation intact distally Intact pulses distally Dorsiflexion/Plantar flexion intact Compartment soft Dressing - dressing C/D/I Motor Function - intact, moving foot and toes well on exam.   Past Medical History:  Diagnosis Date   CKD (chronic kidney disease), symptom management only, stage 4 (severe) (HCC)    Glaucoma    POAG OU   History of anemia due to chronic kidney disease     Hypertensive retinopathy    OU    Assessment/Plan: 1 Day Post-Op Procedure(s) (LRB): EXCISIONAL TOTAL HIP ARTHROPLASTY WITH POSTERIOR APPROACH (Right) Principal Problem:   Dislocation of internal right hip prosthesis, initial encounter ([place]) Active Problems:   History of anemia due to chronic kidney disease   Leukocytosis   Acute renal failure superimposed on stage 4 chronic kidney disease (HCC)   FTT (failure to thrive) in adult   Transaminitis   Dysphagia   Essential hypertension   Gout   Failure to thrive in adult   Protein-calorie malnutrition, severe   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   Weight as of this encounter: 72.6 kg.   PLAN:  Touch down weight bearing RLE May be beneficial to utilize knee immobilizer when transferring/ambulating to support the leg, this can be used as needed.  There was purulent material in the hip which was sent for gram stain/culture intra-op. No organisms seen on gram stain. Would recommend PICC placement and ID consult.   There is no plan for reimplantation of the hip. This will remain as a girdlestone. Goal is for elimination of infection and improved comfort.   Griffith Citron, PA-C Orthopedic Surgery [phone] 12/02/2021, 7:23 AM

## 2021-12-02 NOTE — Progress Notes (Signed)
PROGRESS NOTE    Danny Rhodes  YIR:[id number] DOB: 01-17-1927 DOA: 11/25/2021 PCP: [name], MD    Chief Complaint  Patient presents with   Failure To Thrive    Brief Narrative:  Patient is 88 year old gentleman history of CKD stage IV, HTN, hyperlipidemia, anemia of chronic disease, presented to the ED with complaints of right hip pain and failure to thrive over the past few weeks.  Patient noted to have had right hip pain for about the past 5 years that has progressively been getting worse and started to use a cane about 1.5 years ago and as well as a walker about a year ago.  Patient noted to have woken up on June 8 with severe pain and chills not feeling well and no appetite.  ? Infected hip so ID consulted for recommendations.     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 Alvan Dame who reviewed films, assessed patient, recommending CT scan for further evaluation.   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  Bone extensively destroyed and prostheses are loose and there are large associated soft tissue masses  (pseudotumor), right femoral prosthesis dislocated, one of the pseudotumors projects into the pelvis and has significant mass effect on the rectum and bladder.  Also contains gas and could be secondary infected.  Very large right inguinal hernia containing bowel. -Orthopedics at this point recommending surgical intervention for pain management/palliation and recommending touchdown weightbearing on right lower extremity as tolerated with PT pending CT review per orthopedics- surgery delayed -procedure 12/01/2021. -Continue current pain management with Norco and Dilaudid as needed with CKD. -Continue scheduled Tylenol. -SCDs. -PT/OT. -ID consult: Daptomycin and ceftriaxone pending culture growth Picc line Plan for 4-6 weeks antibiotics  Acute renal failure superimposed on stage 4 chronic kidney disease ([place]) Likely due to prerenal azotemia secondary to FTT with poor PO intake over the past 2 weeks, no hx of exogenous losses or urinary complaints Strict I/O Urinalysis unremarkable. Bladder scan with no urine  -Baseline creatinine approximately 2.3-2.5. Hold/avoid nephrotoxic drugs. Palliative care consulted and following.  FTT (failure to thrive) in adult History of weight loss, poor PO intake and overall FTT Nutrition consult, palliative care consult, SLP eval and patient currently on a dysphagia 2 diet.  Dysphagia New complaint since June 8th. Can not differentiate if oral vs. Pharyngeal/esophageal Good laryngeal elevation with no s/s of aspiration Can swallow liquids fine and grapefruit -States he feels the food balls up in his mouth and just does not go down. -SLP has evaluated the patient and patient to be placed on a dysphagia 2 diet.   -Per  SLP patient sometimes feels food may get regurgitated.   -Barium esophagram done with mild intermittent esophageal dysmotility with tertiary contractions, small hiatal hernia, small volume GERD observed at the level of lower esophagus otherwise  unremarkable esophagram.  -Patient tolerating current diet.  -Continue dysphagia 2 diet. -Continue PPI.   Leukocytosis No signs of symptoms of infection No fever/chills -Likely reactive versus hemoconcentrated. -Chest x-ray negative for any acute cardiopulmonary disease. -Urinalysis unremarkable. -Patient afebrile.   -Follow.  Transaminitis Mildly elevated History of not feeling well, fatigue, COVID test negative.  Gentle IVF. -LFTs trending down.  Essential hypertension Controlled on current regimen Coreg.  -held norvasc to avoid hypotension  History of anemia due to chronic kidney disease Baseline around 10, -Hemoglobin at seems to be stabilizing at 9.6 today, likely dilutional component as patient with no overt bleeding. -Follow H&H. -Transfusion threshold hemoglobin < 7. Continue to monitor   Gout - Stable.   -No acute flare.   -Allopurinol.   Protein-calorie malnutrition, severe - Patient currently tolerating a dysphagia 2 diet.  -Ensure supplementation. -Follow.    DVT prophylaxis: DVT prophylaxis per orthopedics. Code Status: DNR Family Communication: Updated patient.  No family at bedside. Disposition: TBD  Status is: Inpatient Remains inpatient appropriate because: Severity of illness   Consultants:  Orthopedics: Hilbert Odor, PA 11/25/2021 Palliative care: Dr. Hilma Favors 11/26/2021  Procedures:  Chest x-ray 11/25/2021 Plain films of the right hip and pelvis 11/25/2021 Barium esophagram 11/27/2021 CT right hip 11/27/2021     Subjective: No complaints this AM  Objective: Vitals:   12/01/21 2141 12/02/21 0004 12/02/21 0427 12/02/21 0715  BP: (!) 128/50 (!) 116/51 (!) 116/49 (!) 119/44  Pulse: 62 66 60   Resp: 14 14 14    Temp: 98 F (36.7 C) 98.4 F (36.9 C) 98.2 F (36.8 C) 98.2 F (36.8 C)  TempSrc: Axillary Axillary Axillary Oral  SpO2: 100% 100%  100%  Weight:      Height:        Intake/Output Summary (Last 24 hours) at 12/02/2021  1221 Last data filed at 12/02/2021 6789 Gross per 24 hour  Intake 2318.03 ml  Output 1600 ml  Net 718.03 ml   Filed Weights   11/25/21 0947  Weight: 72.6 kg    Examination:   General: Appearance:     Overweight male in no acute distress     Lungs:     respirations unlabored  Heart:    Normal heart rate.   MS:   All extremities are intact.   Neurologic:   Awake, alert, pleasant and cooperative      Data Reviewed:   CBC: Recent Labs  Lab 11/25/21 1408 11/26/21 0229 11/28/21 0208 11/29/21 0352 11/30/21 0459 12/01/21 0631 12/02/21 0248  WBC 17.0*   < > 13.3* 15.6* 14.2* 13.4* 32.8*  NEUTROABS 15.0*  --  11.2*  --  11.8*  --  30.4*  HGB 10.2*   < > 8.7* 8.8* 9.3* 9.6* 11.2*  HCT 33.4*   < > 28.2* 27.5* 29.5* 31.5* 34.9*  MCV 84.1   < > 82.9 82.8 84.5 85.6 86.2  PLT 705*   < > 617* 558* 539* 520* 460*   < > = values in this interval not displayed.    Basic Metabolic Panel: Recent Labs  Lab 11/27/21 0218 11/28/21 0208 11/29/21 0352 11/30/21 0459 12/01/21 0631 12/02/21 0248  NA 140 142 139 139 139 139  K 3.7 3.5 3.5 3.9 4.0 4.9  CL 110 113* 111 112* 112*  114*  CO2 20* 20* 19* 21* 20* 18*  GLUCOSE 111* 157* 109* 102* 114* 149*  BUN 78* 67* 65* 59* 59* 68*  CREATININE 2.95* 2.71* 2.42* 2.50* 2.48* 2.68*  CALCIUM 9.5 9.1 9.1 9.1 9.4 8.6*  MG 2.4 2.1  --   --   --  2.3  PHOS 4.5  --   --   --  3.5  --     GFR: Estimated Creatinine Clearance: 15.9 mL/min (A) (by C-G formula based on SCr of 2.68 mg/dL (H)).  Liver Function Tests: Recent Labs  Lab 11/26/21 0229 11/27/21 0218 11/28/21 0208 11/30/21 0459 12/01/21 0631 12/02/21 0248  AST 53* 42* 51* 51*  --  34  ALT 65* 50* 48* 52*  --  36  ALKPHOS 161* 139* 125 113  --  88  BILITOT 0.7 0.8 0.3 0.4  --  0.7  PROT 7.3 6.5 5.7* 5.8*  --  5.3*  ALBUMIN 2.4* 2.1* 1.8* 1.8* 1.9* 1.8*    CBG: Recent Labs  Lab 11/29/21 0848  GLUCAP 106*     Recent Results (from the past 240 hour(s))  SARS  Coronavirus 2 by RT PCR (hospital order, performed in [facility] hospital lab) *cepheid single result test* Urine, Clean Catch     Status: None   Collection Time: 11/25/21  4:42 PM   Specimen: Urine, Clean Catch; Nasal Swab  Result Value Ref Range Status   SARS Coronavirus 2 by RT PCR NEGATIVE NEGATIVE Final    Comment: (NOTE) SARS-CoV-2 target nucleic acids are NOT DETECTED.  The SARS-CoV-2 RNA is generally detectable in upper and lower respiratory specimens during the acute phase of infection. The lowest concentration of SARS-CoV-2 viral copies this assay can detect is 250 copies / mL. A negative result does not preclude SARS-CoV-2 infection and should not be used as the sole basis for treatment or other patient management decisions.  A negative result may occur with improper specimen collection / handling, submission of specimen other than nasopharyngeal swab, presence of viral mutation(s) within the areas targeted by this assay, and inadequate number of viral copies (<250 copies / mL). A negative result must be combined with clinical observations, patient history, and epidemiological information.  Fact Sheet for Patients:   https://www.patel.info/  Fact Sheet for Healthcare Providers: https://hall.com/  This test is not yet approved or  cleared by the [place] FDA and has been authorized for detection and/or diagnosis of SARS-CoV-2 by FDA under an Emergency Use Authorization (EUA).  This EUA will remain in effect (meaning this test can be used) for the duration of the COVID-19 declaration under Section 564(b)(1) of the Act, 21 U.S.C. section 360bbb-3(b)(1), unless the authorization is terminated or revoked sooner.  Performed at [facility], [place] [address], [place], [place] [address]   Surgical PCR screen     Status: None   Collection Time: 11/26/21  7:12 AM   Specimen: Nasal Mucosa; Nasal Swab  Result Value Ref Range  Status   MRSA, PCR NEGATIVE NEGATIVE Final   Staphylococcus aureus NEGATIVE NEGATIVE Final    Comment: (NOTE) The Xpert SA Assay (FDA approved for NASAL specimens in patients 41 years of age and older), is one component of a comprehensive surveillance program. It is not intended to diagnose infection nor to guide or monitor treatment. Performed at [facility], [place] [address] [place], [place] [address]   Aerobic/Anaerobic Culture w Gram Stain (surgical/deep wound)     Status: None (Preliminary result)  Collection Time: 12/01/21  5:08 PM   Specimen: PATH Other; Tissue  Result Value Ref Range Status   Specimen Description FLUID  Final   Special Requests RIGHT HIP FLUID  Final   Gram Stain   Final    FEW WBC PRESENT,BOTH PMN AND MONONUCLEAR NO ORGANISMS SEEN    Culture   Final    NO GROWTH < 24 HOURS Performed at [facility], [place] [address], [place], [place] [address]    Report Status PENDING  Incomplete         Radiology Studies: DG Pelvis Portable  Result Date: 12/01/2021 CLINICAL DATA:  Status post total hip arthroplasty EXAM: PORTABLE PELVIS 1-2 VIEWS COMPARISON:  CT 11/27/2021 FINDINGS: Since prior CT, there is been interval removal of the right hip prosthesis. Extensive bone destruction noted in the proximal femur. Cerclage wires are seen in the mid femoral shaft. Gas seen within the location of previous acetabular and femoral head component within the right hemipelvis, presumably postoperative. New large right inguinal hernia again noted containing bowel. IMPRESSION: Interval removal of prior right hip replacement with extensive bone destruction in the proximal right femur. Gas is seen in the right hemipelvis in previous location of acetabular component and femoral head component, presumably postoperative. Large right inguinal hernia containing bowel. Electronically Signed   By: Rolm Baptise M.D.   On: 12/01/2021 20:17        Scheduled Meds:  acetaminophen   1,000 mg Oral Q6H   allopurinol  100 mg Oral q morning   amLODipine  10 mg Oral q morning   aspirin  81 mg Oral BID   carvedilol  3.125 mg Oral BID WC   docusate sodium  100 mg Oral BID   dorzolamide  1 drop Both Eyes BID   feeding supplement  237 mL Oral TID BM   ferrous sulfate  325 mg Oral TID PC   latanoprost  1 drop Both Eyes QHS   multivitamin  1 tablet Oral QHS   pantoprazole  40 mg Oral Q0600   senna-docusate  1 tablet Oral BID   Continuous Infusions:  sodium chloride 75 mL/hr at 12/02/21 0339   sodium chloride     cefTRIAXone (ROCEPHIN)  IV 2 g (12/02/21 1152)   DAPTOmycin (CUBICIN) 600 mg in sodium chloride 0.9 % IVPB     [START ON 12/04/2021] DAPTOmycin (CUBICIN) 600 mg in sodium chloride 0.9 % IVPB     methocarbamol (ROBAXIN) IV         LOS: 7 days    Time spent: 35 minutes    Geradine Girt, DO [facility]   To contact the attending provider between 7A-7P or the covering provider during after hours 7P-7A, please log into the web site [email or web address] and access using universal [place] password for that web site. If you do not have the password, please call the hospital operator.  12/02/2021, 12:21 PM

## 2021-12-02 NOTE — Plan of Care (Signed)
  Problem: Education: Goal: Knowledge of General Education information will improve Description: Including pain rating scale, medication(s)/side effects and non-pharmacologic comfort measures Outcome: Progressing   Problem: Health Behavior/Discharge Planning: Goal: Ability to manage health-related needs will improve Outcome: Progressing   Problem: Clinical Measurements: Goal: Will remain free from infection Outcome: Progressing Goal: Diagnostic test results will improve Outcome: Progressing Goal: Respiratory complications will improve Outcome: Progressing Goal: Cardiovascular complication will be avoided Outcome: Progressing   Problem: Activity: Goal: Risk for activity intolerance will decrease Outcome: Progressing   Problem: Nutrition: Goal: Adequate nutrition will be maintained Outcome: Not Progressing Note: Instructed patient to attempt eat more of his food

## 2021-12-02 NOTE — Evaluation (Signed)
Physical Therapy Evaluation Patient Details Name: Danny Rhodes MRN: [id number] DOB: May 27, 1927 Today's Date: 12/02/2021  History of Present Illness  88 y.o. male  with past medical history of glaucoma, HTN, gout, anemia due to CKD, and right hip prosthesis admitted on 11/25/2021 with right hip pain.  Patient has been diagnosed with dislocation of anterior right hip prosthesis. s/p Resection right total hip arthroplasty, Girdlestone procedure, and extensive excisional debridement  Clinical Impression   Pt admitted with above diagnosis. Lives at home with wife and daughter, in a single-level home with a level entry; Prior to admission, pt's functional status was declining due to the hip pain, used a RW in the weeks (months?) leading up to this admission;  Presents to PT with functional dependencies, RLE weight bearing restrictions post girdlestone procedure, also with anticipated postop pain; Moves slowly and needed mod assist to come to sitting EOB, Mod assist to stand, and Mod assist for pivot on LLE bed to chair on pt's L; Maintaining TWB quite well using RW; Motivated to get home; Able to direct his care well;  Now that the first time getting up postop is done, I anticiapte good progress; Ordered OT consult fo rADLs; Pt currently with functional limitations due to the deficits listed below (see PT Problem List). Pt will benefit from skilled PT to increase their independence and safety with mobility to allow discharge to the venue listed below.          Recommendations for follow up therapy are one component of a multi-disciplinary discharge planning process, led by the attending physician.  Recommendations may be updated based on patient status, additional functional criteria and insurance authorization.  Follow Up Recommendations Home health PT      Assistance Recommended at Discharge Frequent or constant Supervision/Assistance  Patient can return home with the following  A lot of help with  walking and/or transfers;A lot of help with bathing/dressing/bathroom;Assistance with cooking/housework;Assist for transportation;Help with stairs or ramp for entrance    Equipment Recommendations Rolling walker (2 wheels);BSC/3in1;Wheelchair (measurements PT);Wheelchair cushion (measurements PT);Hospital bed;Other (comment) (will consider drop-arm BSC - and can defer to OT)  Recommendations for Other Services  OT consult (ordered per protocol)    Functional Status Assessment Patient has had a recent decline in their functional status and demonstrates the ability to make significant improvements in function in a reasonable and predictable amount of time.     Precautions / Restrictions Precautions Precautions: Fall Precaution Comments: Transfers only Restrictions RLE Weight Bearing: Touchdown weight bearing      Mobility  Bed Mobility Overal bed mobility: Needs Assistance Bed Mobility: Supine to Sit     Supine to sit: Mod assist, +2 for safety/equipment     General bed mobility comments: Pt is apprehensive re: onset of pain and wants ot move slowly; able to half-bridge hips to EOB; pushes up on elbows well; Mod assist to pull to sit and to slowly square off hips at eOB    Transfers Overall transfer level: Needs assistance Equipment used: Rolling walker (2 wheels) Transfers: Sit to/from Stand, Bed to chair/wheelchair/BSC Sit to Stand: Mod assist, +2 safety/equipment Stand pivot transfers: Mod assist, +2 safety/equipment         General transfer comment: Good rise from bed to stand with RW; pivot heel-toe steps on LLE to turn to recliner    Ambulation/Gait                  Stairs  Wheelchair Mobility    Modified Rankin (Stroke Patients Only)       Balance                                             Pertinent Vitals/Pain Pain Assessment Pain Assessment: Faces Faces Pain Scale: Hurts little more Pain Location: R hip,  especially with movement Pain Descriptors / Indicators: Aching, Grimacing, Guarding Pain Intervention(s): Monitored during session, Premedicated before session, Repositioned, Other (comment) (Supported RLE as pt needed coming off of the bed)    Home Living Family/patient expects to be discharged to:: Private residence Living Arrangements: Spouse/significant other Available Help at Discharge: Family;Available 24 hours/day Type of Home: House Home Access: Level entry       Home Layout: One level Home Equipment: Conservation officer, nature (2 wheels);Cane - single point      Prior Function Prior Level of Function : Needs assist       Physical Assist : Mobility (physical) Mobility (physical): Gait   Mobility Comments: Needed to use an assistive device, first cane, leading up to using RW in the months leading up to this admission ADLs Comments: Has been unable to get into shower in recent months leading to this admission; sponge bathing     Hand Dominance        Extremity/Trunk Assessment   Upper Extremity Assessment Upper Extremity Assessment: Defer to OT evaluation    Lower Extremity Assessment Lower Extremity Assessment: Generalized weakness;RLE deficits/detail RLE Deficits / Details: Decr AROM and strength postop RLE: Unable to fully assess due to pain       Communication   Communication: No difficulties  Cognition Arousal/Alertness: Awake/alert Behavior During Therapy: [name] for tasks assessed/performed Overall Cognitive Status: Within Functional Limits for tasks assessed                                 General Comments: Pleasant, participating, and trying hard; asks to move slowly        General Comments General comments (skin integrity, edema, etc.): Wife and daughter present for session    Exercises     Assessment/Plan    PT Assessment Patient needs continued PT services  PT Problem List Decreased strength;Decreased range of motion;Decreased activity  tolerance;Decreased balance;Decreased mobility;Decreased coordination;Decreased knowledge of use of DME;Decreased safety awareness;Decreased knowledge of precautions       PT Treatment Interventions DME instruction;Functional mobility training;Therapeutic activities;Therapeutic exercise;Balance training;Patient/family education;Wheelchair mobility training;Neuromuscular re-education;Cognitive remediation    PT Goals (Current goals can be found in the Care Plan section)  Acute Rehab PT Goals Patient Stated Goal: Be abel to get home PT Goal Formulation: With patient Time For Goal Achievement: 12/16/21 Potential to Achieve Goals: Good Additional Goals Additional Goal #1: Pt will propel wc 150 ft and manage parts with min assist    Frequency Min 5X/week     Co-evaluation               AM-PAC PT "6 Clicks" Mobility  Outcome Measure Help needed turning from your back to your side while in a flat bed without using bedrails?: A Lot Help needed moving from lying on your back to sitting on the side of a flat bed without using bedrails?: A Lot Help needed moving to and from a bed to a chair (including a wheelchair)?: A Lot Help  needed standing up from a chair using your arms (e.g., wheelchair or bedside chair)?: A Lot Help needed to walk in hospital room?: Total Help needed climbing 3-5 steps with a railing? : Total 6 Click Score: 10    End of Session Equipment Utilized During Treatment: Gait belt Activity Tolerance: Patient tolerated treatment well Patient left: in chair;with call bell/phone within reach Nurse Communication: Mobility status PT Visit Diagnosis: Unsteadiness on feet (R26.81);Other abnormalities of gait and mobility (R26.89);Pain Pain - Right/Left: Right Pain - part of body: Hip    Time: 1444-5848 PT Time Calculation (min) (ACUTE ONLY): 43 min   Charges:   PT Evaluation $PT Eval Moderate Complexity: 1 Mod PT Treatments $Therapeutic Activity: 23-37 mins         Roney Marion, PT  Acute Rehabilitation Services Office [phone]   Colletta [place] 12/02/2021, 5:23 PM

## 2021-12-02 NOTE — Progress Notes (Signed)
Pharmacy Antibiotic Note  Danny Rhodes is a 88 y.o. male admitted on 11/25/2021 with R-hip PJI.  Pharmacy has been consulted for Daptomycin + Rocephin dosing.   Plan: - Daptomycin 600 mg (8 mg/kg) x 1 dose now then every 48 hours (renally adjustment) - Rocephin 2g IV every 24 hours - Will continue to follow renal function, culture results, LOT, and antibiotic de-escalation plans   Height: 5\' 6"  (167.6 cm) Weight: 72.6 kg (160 lb) IBW/kg (Calculated) : 63.8  Temp (24hrs), Avg:98.2 F (36.8 C), Min:98 F (36.7 C), Max:98.4 F (36.9 C)  Recent Labs  Lab 11/28/21 0208 11/29/21 0352 11/30/21 0459 12/01/21 0631 12/02/21 0248  WBC 13.3* 15.6* 14.2* 13.4* 32.8*  CREATININE 2.71* 2.42* 2.50* 2.48* 2.68*    Estimated Creatinine Clearance: 15.9 mL/min (A) (by C-G formula based on SCr of 2.68 mg/dL (H)).    No Known Allergies  Thank you for allowing pharmacy to be a part of this patient's care.  Alycia Rossetti, PharmD, BCPS Infectious Diseases Clinical Pharmacist 12/02/2021 10:40 AM   **Pharmacist phone directory can now be found on [email or web address] ([email or web address]).  Listed under [facility].

## 2021-12-02 NOTE — Consult Note (Signed)
[facility] for Infectious Disease       Reason for Consult: prosthetic joint infection    Referring Physician: Dr. 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, right    acetaminophen  1,000 mg Oral Q6H   allopurinol  100 mg Oral q morning   amLODipine  10 mg Oral q morning   aspirin  81 mg Oral BID   carvedilol  3.125 mg Oral BID WC   docusate sodium  100 mg Oral BID   dorzolamide  1 drop Both Eyes BID   feeding supplement  237 mL Oral TID BM   ferrous sulfate  325 mg Oral TID PC   latanoprost  1 drop Both Eyes QHS   multivitamin  1 tablet Oral QHS   pantoprazole  40 mg Oral Q0600   senna-docusate  1 tablet Oral BID    Recommendations: Daptomycin and ceftriaxone pending culture growth Picc line Plan for 4-6 weeks antibiotics  Assessment: PJI of the right hip requiring extensive debridment and no further plans  or possibility for reimplantation.  Plan noted for follow up CT to further evaluate postoperatively at some point.    Antibiotics: Perioperative cefazolin  HPI: Danny Rhodes is a 88 y.o. male with a history of a right total hip arthroplasty done remotely with a revision in 2008 by Dr. Alvan Dame presents with acute severe pain in his hip.  He last saw Dr. Alvan Dame in 2019 for some pain but then lost to follow up until now.  CT scan of his hip noted extensive bone destruction, loose prosthesis and lare pseudotumors.  He was taken to the OR for debridement on 6/27 and underwent resection of the right total hip  arthroplasty, Girdlestone procedure and excisional and non-excisional debridement of bone, muscle and tendon.    Review of Systems:  Constitutional: negative for fevers and chills All other systems reviewed and are negative    Past Medical History:  Diagnosis Date   CKD (chronic kidney disease), symptom management only, stage 4 (severe) (HCC)    Glaucoma    POAG OU   History of anemia due to chronic kidney disease    Hypertensive retinopathy    OU    Social History   Tobacco Use   Smoking status: Never   Smokeless tobacco: Never  Substance Use Topics   Alcohol use: Not Currently    History reviewed. No pertinent family history.  No Known Allergies  Physical Exam: Constitutional: in no apparent distress  Vitals:   12/02/21 0427 12/02/21 0715  BP: (!) 116/49 (!) 119/44  Pulse: 60   Resp: 14   Temp: 98.2 F (36.8 C) 98.2 F (36.8 C)  SpO2:  100%   EYES: anicteric Respiratory: normal respiratory effort Musculoskeletal: right hip wrapped with drain in place Skin: no rash  Lab Results  Component Value Date   WBC 32.8 (H) 12/02/2021   HGB 11.2 (L) 12/02/2021   HCT 34.9 (L) 12/02/2021   MCV 86.2 12/02/2021   PLT 460 (H) 12/02/2021  Lab Results  Component Value Date   CREATININE 2.68 (H) 12/02/2021   BUN 68 (H) 12/02/2021   NA 139 12/02/2021   K 4.9 12/02/2021   CL 114 (H) 12/02/2021   CO2 18 (L) 12/02/2021    Lab Results  Component Value Date   ALT 36 12/02/2021   AST 34 12/02/2021   ALKPHOS 88 12/02/2021     Microbiology: Recent Results (from the past 240 hour(s))  SARS Coronavirus 2 by RT PCR (hospital order, performed in [facility] hospital lab) *cepheid single result test* Urine, Clean Catch     Status: None   Collection Time: 11/25/21  4:42 PM   Specimen: Urine, Clean Catch; Nasal Swab  Result Value Ref Range Status   SARS Coronavirus 2 by RT PCR NEGATIVE NEGATIVE Final    Comment: (NOTE) SARS-CoV-2 target nucleic acids are NOT  DETECTED.  The SARS-CoV-2 RNA is generally detectable in upper and lower respiratory specimens during the acute phase of infection. The lowest concentration of SARS-CoV-2 viral copies this assay can detect is 250 copies / mL. A negative result does not preclude SARS-CoV-2 infection and should not be used as the sole basis for treatment or other patient management decisions.  A negative result may occur with improper specimen collection / handling, submission of specimen other than nasopharyngeal swab, presence of viral mutation(s) within the areas targeted by this assay, and inadequate number of viral copies (<250 copies / mL). A negative result must be combined with clinical observations, patient history, and epidemiological information.  Fact Sheet for Patients:   https://www.patel.info/  Fact Sheet for Healthcare Providers: https://hall.com/  This test is not yet approved or  cleared by the [place] FDA and has been authorized for detection and/or diagnosis of SARS-CoV-2 by FDA under an Emergency Use Authorization (EUA).  This EUA will remain in effect (meaning this test can be used) for the duration of the COVID-19 declaration under Section 564(b)(1) of the Act, 21 U.S.C. section 360bbb-3(b)(1), unless the authorization is terminated or revoked sooner.  Performed at [facility], [place] [address] [place], [place] [address]   Surgical PCR screen     Status: None   Collection Time: 11/26/21  7:12 AM   Specimen: Nasal Mucosa; Nasal Swab  Result Value Ref Range Status   MRSA, PCR NEGATIVE NEGATIVE Final   Staphylococcus aureus NEGATIVE NEGATIVE Final    Comment: (NOTE) The Xpert SA Assay (FDA approved for NASAL specimens in patients 71 years of age and older), is one component of a comprehensive surveillance program. It is not intended to diagnose infection nor to guide or monitor treatment. Performed at [place], [place] [address], [place], [place] [address]   Aerobic/Anaerobic Culture w Gram Stain (surgical/deep wound)     Status: None (Preliminary result)   Collection Time: 12/01/21  5:08 PM   Specimen: PATH Other; Tissue  Result Value Ref Range Status   Specimen Description FLUID  Final   Special Requests RIGHT HIP FLUID  Final   Gram Stain   Final    FEW WBC PRESENT,BOTH PMN AND MONONUCLEAR NO ORGANISMS SEEN    Culture   Final    NO GROWTH < 24 HOURS Performed at [facility], [place] [address], [place], [place] [address]    Report Status PENDING  Incomplete    Thayer Headings, [facility] for Infectious Disease [place] [email or web address] 12/02/2021, 11:15 AM

## 2021-12-02 NOTE — Plan of Care (Signed)

## 2021-12-03 ENCOUNTER — Encounter (HOSPITAL_COMMUNITY): Payer: Medicare Other

## 2021-12-03 ENCOUNTER — Inpatient Hospital Stay: Payer: Self-pay

## 2021-12-03 DIAGNOSIS — T84020A Dislocation of internal right hip prosthesis, initial encounter: Secondary | ICD-10-CM | POA: Diagnosis not present

## 2021-12-03 LAB — CBC
HCT: 27.3 % — ABNORMAL LOW (ref 39.0–52.0)
Hemoglobin: 8.8 g/dL — ABNORMAL LOW (ref 13.0–17.0)
MCH: 27.1 pg (ref 26.0–34.0)
MCHC: 32.2 g/dL (ref 30.0–36.0)
MCV: 84 fL (ref 80.0–100.0)
Platelets: 490 10*3/uL — ABNORMAL HIGH (ref 150–400)
RBC: 3.25 MIL/uL — ABNORMAL LOW (ref 4.22–5.81)
RDW: 16.7 % — ABNORMAL HIGH (ref 11.5–15.5)
WBC: 20 10*3/uL — ABNORMAL HIGH (ref 4.0–10.5)
nRBC: 0 % (ref 0.0–0.2)

## 2021-12-03 LAB — BASIC METABOLIC PANEL
Anion gap: 7 (ref 5–15)
BUN: 77 mg/dL — ABNORMAL HIGH (ref 8–23)
CO2: 18 mmol/L — ABNORMAL LOW (ref 22–32)
Calcium: 8.6 mg/dL — ABNORMAL LOW (ref 8.9–10.3)
Chloride: 110 mmol/L (ref 98–111)
Creatinine, Ser: 2.86 mg/dL — ABNORMAL HIGH (ref 0.61–1.24)
GFR, Estimated: 20 mL/min — ABNORMAL LOW (ref >60)
Glucose, Bld: 158 mg/dL — ABNORMAL HIGH (ref 70–99)
Potassium: 4.5 mmol/L (ref 3.5–5.1)
Sodium: 135 mmol/L (ref 135–145)

## 2021-12-03 LAB — CK: Total CK: 39 U/L — ABNORMAL LOW (ref 49–397)

## 2021-12-03 NOTE — Evaluation (Signed)
Occupational Therapy Evaluation Patient Details Name: Danny Rhodes MRN: [id number] DOB: 1927-02-03 Today's Date: 12/03/2021   History of Present Illness 88 y.o. male  with past medical history of glaucoma, HTN, gout, anemia due to CKD, and right hip prosthesis admitted on 11/25/2021 with right hip pain.  Patient has been diagnosed with dislocation of anterior right hip prosthesis. s/p Resection right total hip arthroplasty, Girdlestone procedure, and extensive excisional debridement   Clinical Impression   PTA, pt lives with family, typically Modified Independent with ADLs/mobility using RW. Pt presents now with deficits in pain, dynamic standing balance (impacted by WB precautions), and endurance. Pt eager to participate and moving well. Pt requires Setup for UB ADL, Min-Mod A for LB ADLs, and Min A for basic transfers with good attention to WB precautions. Educated re: AE for LB ADLs, ramp/threshold mgmt with w/c, need to measure doorways to ensure all necessary rooms are w/c accessible. Family present and supportive. Plan to bring wheelchair for pt to practice with in next session to maintain established precautions. Recommend HHOT follow-up at DC.      Recommendations for follow up therapy are one component of a multi-disciplinary discharge planning process, led by the attending physician.  Recommendations may be updated based on patient status, additional functional criteria and insurance authorization.   Follow Up Recommendations  Home health OT    Assistance Recommended at Discharge Frequent or constant Supervision/Assistance  Patient can return home with the following A little help with walking and/or transfers;A little help with bathing/dressing/bathroom;Assistance with cooking/housework;Assist for transportation;Help with stairs or ramp for entrance    Functional Status Assessment  Patient has had a recent decline in their functional status and demonstrates the ability to make  significant improvements in function in a reasonable and predictable amount of time.  Equipment Recommendations  BSC/3in1;Wheelchair (measurements OT);Wheelchair cushion (measurements OT);Other (comment) (AE for LB ADLs)    Recommendations for Other Services       Precautions / Restrictions Precautions Precautions: Fall Restrictions Weight Bearing Restrictions: Yes RLE Weight Bearing: Touchdown weight bearing Other Position/Activity Restrictions: TDWB for transfers only      Mobility Bed Mobility               General bed mobility comments: up in chair on entry    Transfers Overall transfer level: Needs assistance Equipment used: Rolling walker (2 wheels) Transfers: Sit to/from Stand Sit to Stand: Min assist           General transfer comment: Increased time to power up and problem solve hand placement. good triceps strength noted to complete this task with increased time      Balance Overall balance assessment: Needs assistance Sitting-balance support: No upper extremity supported, Feet supported Sitting balance-[name]: Good     Standing balance support: Single extremity supported, Bilateral upper extremity supported, During functional activity Standing balance-[name]: Poor Standing balance comment: reliant on UE support to maintain WB precautions                           ADL either performed or assessed with clinical judgement   ADL Overall ADL's : Needs assistance/impaired Eating/Feeding: Independent   Grooming: Set up   Upper Body Bathing: Set up;Sitting   Lower Body Bathing: Moderate assistance;Sit to/from stand   Upper Body Dressing : Set up;Sitting   Lower Body Dressing: Moderate assistance;Sitting/lateral leans;Sit to/from stand Lower Body Dressing Details (indicate cue type and reason): assist to don around  B feet. able to demo ability to alternate UE support on RW to simulate donning pants around waist. educated on use  of AE with pt able to return demo use of bariatric sock aide, reacher Toilet Transfer: Minimal assistance;Stand-pivot;BSC/3in1;Rolling walker (2 wheels)   Toileting- Clothing Manipulation and Hygiene: Minimal assistance;Sitting/lateral lean;Sit to/from stand         General ADL Comments: Family present and supportive, answered questions regarding ramp mgmt and entering threshold of home w/ wheelchair. emphasized use of wc for mobility and measuring doorways to see if this DME would fit in bathroom/bedroom     Vision Baseline Vision/History: 1 Wears glasses Ability to See in Adequate Light: 0 Adequate Patient Visual Report: No change from baseline Vision Assessment?: No apparent visual deficits     Perception     Praxis      Pertinent Vitals/Pain Pain Assessment Pain Assessment: Faces Faces Pain Scale: Hurts little more Pain Location: R hip, especially with movement Pain Descriptors / Indicators: Aching, Grimacing, Guarding Pain Intervention(s): Monitored during session, Limited activity within patient's tolerance, Repositioned     Hand Dominance Right   Extremity/Trunk Assessment Upper Extremity Assessment Upper Extremity Assessment: Overall [name] for tasks assessed   Lower Extremity Assessment Lower Extremity Assessment: Defer to PT evaluation   Cervical / Trunk Assessment Cervical / Trunk Assessment: Kyphotic   Communication Communication Communication: No difficulties   Cognition Arousal/Alertness: Awake/alert Behavior During Therapy: [name] for tasks assessed/performed Overall Cognitive Status: Within Functional Limits for tasks assessed                                 General Comments: Pleasant and paticipatory. Wants to do as much as he can without assistance and needs to move at his pace     General Comments  Wife, daughter and nephew present    Exercises     Shoulder Instructions      Home Living Family/patient expects to be discharged  to:: Private residence Living Arrangements: Spouse/significant other;Children Available Help at Discharge: Family;Available 24 hours/day Type of Home: House Home Access: Ramped entrance     Home Layout: One level     Bathroom Shower/Tub: Occupational psychologist: Handicapped height     Home Equipment: Conservation officer, nature (2 wheels);Cane - single point;Shower seat - built in;Grab bars - tub/shower;Rollator (4 wheels);Adaptive equipment;Hand held shower head Adaptive Equipment: Long-handled shoe horn        Prior Functioning/Environment Prior Level of Function : Needs assist             Mobility Comments: Needed to use an assistive device, first cane, leading up to using RW in the months leading up to this admission ADLs Comments: difficulty with sock/shoe mgmt but uses shoehorn to assist with task. typically able to complete ADLs        OT Problem List: Decreased strength;Decreased activity tolerance;Impaired balance (sitting and/or standing);Decreased knowledge of use of DME or AE;Pain      OT Treatment/Interventions: Self-care/ADL training;Therapeutic exercise;Energy conservation;DME and/or AE instruction;Therapeutic activities;Patient/family education;Balance training    OT Goals(Current goals can be found in the care plan section) Acute Rehab OT Goals Patient Stated Goal: be able to go home, be able to get around in wc well OT Goal Formulation: With patient/family Time For Goal Achievement: 12/16/21 Potential to Achieve Goals: Good  OT Frequency: Min 2X/week    Co-evaluation  AM-PAC OT "6 Clicks" Daily Activity     Outcome Measure Help from another person eating meals?: None Help from another person taking care of personal grooming?: A Little Help from another person toileting, which includes using toliet, bedpan, or urinal?: A Little Help from another person bathing (including washing, rinsing, drying)?: A Lot Help from another person to  put on and taking off regular upper body clothing?: A Little Help from another person to put on and taking off regular lower body clothing?: A Lot 6 Click Score: 17   End of Session Equipment Utilized During Treatment: Gait belt;Rolling walker (2 wheels) Nurse Communication: Mobility status  Activity Tolerance: Patient tolerated treatment well Patient left: in chair;with call bell/phone within reach;with family/visitor present  OT Visit Diagnosis: Unsteadiness on feet (R26.81);Other abnormalities of gait and mobility (R26.89);Pain Pain - Right/Left: Right Pain - part of body: Hip                Time: [id number] OT Time Calculation (min): 51 min Charges:  OT General Charges $OT Visit: 1 Visit OT Evaluation $OT Eval Moderate Complexity: 1 Mod OT Treatments $Self Care/Home Management : 8-22 mins $Therapeutic Activity: 8-22 mins  Malachy Chamber, OTR/L Acute Rehab Services Office: [phone]   Layla Maw 12/03/2021, 2:04 PM

## 2021-12-03 NOTE — TOC Progression Note (Addendum)
Transition of Care [facility]) - Progression Note    Patient Details  Name: Danny Rhodes MRN: [id number] Date of Birth: June 01, 1927  Transition of Care [facility]) CM/SW Contact  Sharin Mons, RN Phone Number: 12/03/2021, 4:11 PM  Clinical Narrative:    NCM spoke with pt's daughter Danny Rhodes regarding LT IV ABX need. Danny Rhodes unsure if she or sister can manage pt's care if d/c to home. Danny Rhodes states she and sister  are open to hear what's needed for home infusion services in order to make a sound disposition determination for pt.. Referral made with Jeannene Patella / [facility] to speak with daughters.  TOC team following and will assist with needs.   Expected Discharge Plan: [place] (vs SNF) Barriers to Discharge: Continued Medical Work up  Expected Discharge Plan and Services Expected Discharge Plan: [place] (vs SNF) In-house Referral: Clinical Social Work   Post Acute Care Choice:  (TBD) Living arrangements for the past 2 months: Single Family Home                                       Social Determinants of Health (SDOH) Interventions    Readmission Risk Interventions     No data to display

## 2021-12-03 NOTE — Progress Notes (Signed)
PROGRESS NOTE    Danny Rhodes  BBC:[id number] DOB: 26-Apr-1927 DOA: 11/25/2021 PCP: [name], MD    Chief Complaint  Patient presents with   Failure To Thrive    Brief Narrative:  Patient is 88 year old gentleman history of CKD stage IV, HTN, hyperlipidemia, anemia of chronic disease, presented to the ED with complaints of right hip pain and failure to thrive over the past few weeks.  Patient noted to have had right hip pain for about the past 5 years that has progressively been getting worse and started to use a cane about 1.5 years ago and as well as a walker about a year ago.  Patient noted to have woken up on June 8 with severe pain and chills not feeling well and no appetite.  ? Infected hip so ID consulted for recommendations.     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 Alvan Dame who reviewed films, assessed patient, recommending CT scan for further evaluation.   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  Bone extensively destroyed and prostheses are loose and there are large associated soft tissue masses  (pseudotumor), right femoral prosthesis dislocated, one of the pseudotumors projects into the pelvis and has significant mass effect on the rectum and bladder.  Also contains gas and could be secondary infected.  Very large right inguinal hernia containing bowel. -Orthopedics at this point recommending surgical intervention for pain management/palliation and recommending touchdown weightbearing on right lower extremity as tolerated with PT pending CT review per orthopedics- surgery delayed -procedure 12/01/2021. -Continue current pain management with Norco and Dilaudid as needed with CKD. -Continue scheduled Tylenol. -SCDs. -PT/OT. -ID consult: Daptomycin and ceftriaxone pending culture growth Picc line Plan for 4-6 weeks antibiotics -TOC consulted for d/c planning  Acute renal failure superimposed on stage 4 chronic kidney disease ([place]) Likely due to prerenal azotemia secondary to FTT with poor PO intake over the past 2 weeks, no hx of exogenous losses or urinary complaints Strict I/O Urinalysis unremarkable. Bladder scan with no urine  -Baseline creatinine approximately 2.3-2.5. Hold/avoid nephrotoxic drugs. Palliative care consulted and following.  FTT (failure to thrive) in adult History of weight loss, poor PO intake and overall FTT Nutrition consult, palliative care consult, SLP eval and patient currently on a dysphagia 2 diet.  Dysphagia New complaint since June 8th. Can not differentiate if oral vs. Pharyngeal/esophageal Good laryngeal elevation with no s/s of aspiration Can swallow liquids fine and grapefruit -States he feels the food balls up in his mouth and just does not go down. -SLP has evaluated the patient and patient to be placed on a  dysphagia 2 diet.   -Per SLP patient sometimes feels food may get regurgitated.   -Barium esophagram done with mild intermittent esophageal dysmotility with tertiary contractions, small hiatal hernia, small volume GERD observed at the  level of lower esophagus otherwise unremarkable esophagram.  -Patient tolerating current diet.  -Continue dysphagia 2 diet. -Continue PPI.   Leukocytosis No signs of symptoms of infection No fever/chills -trending down -Chest x-ray negative for any acute cardiopulmonary disease. -Urinalysis unremarkable. -Patient afebrile.   -Follow.  Transaminitis Mildly elevated History of not feeling well, fatigue, COVID test negative.  Gentle IVF. -LFTs trending down.  Essential hypertension Controlled on current regimen Coreg.  -held norvasc to avoid hypotension  History of anemia due to chronic kidney disease Baseline around 10, -Hemoglobin at seems to be stabilizing at 9.6 today, likely dilutional component as patient with no overt bleeding. -Follow H&H. -Transfusion threshold hemoglobin < 7. Continue to monitor   Gout - Stable.   -No acute flare.   -Allopurinol.   Protein-calorie malnutrition, severe - Patient currently tolerating a dysphagia 2 diet.  -Ensure supplementation. -Follow.    DVT prophylaxis: DVT prophylaxis per orthopedics. Code Status: DNR Family Communication: Updated patient.  No family at bedside. Disposition: TBD  Status is: Inpatient Remains inpatient appropriate because: Severity of illness   Consultants:  Orthopedics: Hilbert Odor, PA 11/25/2021 Palliative care: Dr. Hilma Favors 11/26/2021  Procedures:  Chest x-ray 11/25/2021 Plain films of the right hip and pelvis 11/25/2021 Barium esophagram 11/27/2021 CT right hip 11/27/2021     Subjective: Visiting with family- sitting in chair  Objective: Vitals:   12/02/21 0715 12/02/21 1950 12/03/21 0328 12/03/21 0726  BP: (!) 119/44 (!) 131/53 (!) 146/58 (!) 136/53  Pulse:  74 81 74  Resp:  18 18 18   Temp: 98.2 F (36.8 C) 97.6 F (36.4 C) 97.7 F (36.5 C) 98 F (36.7 C)  TempSrc: Oral  Oral Oral  SpO2: 100% 100% 99% 100%  Weight:      Height:        Intake/Output Summary (Last 24 hours)  at 12/03/2021 1153 Last data filed at 12/03/2021 1000 Gross per 24 hour  Intake 2533.79 ml  Output 600 ml  Net 1933.79 ml   Filed Weights   11/25/21 0947  Weight: 72.6 kg    Examination:   General: Appearance:     Overweight male in no acute distress     Lungs:     respirations unlabored  Heart:    Normal heart rate. Normal rhythm. No murmurs, rubs, or gallops.   MS:   All extremities are intact.   Neurologic:   Awake, alert, pleasant and cooperative      Data Reviewed:   CBC: Recent Labs  Lab 11/28/21 0208 11/29/21 0352 11/30/21 0459 12/01/21 0631 12/02/21 0248 12/03/21 0543  WBC 13.3* 15.6* 14.2* 13.4* 32.8* 20.0*  NEUTROABS 11.2*  --  11.8*  --  30.4*  --   HGB 8.7* 8.8* 9.3* 9.6* 11.2* 8.8*  HCT 28.2* 27.5* 29.5* 31.5* 34.9* 27.3*  MCV 82.9 82.8 84.5 85.6 86.2 84.0  PLT 617* 558* 539* 520* 460* 490*    Basic Metabolic Panel: Recent Labs  Lab 11/27/21 0218 11/28/21 0208 11/29/21 0352 11/30/21 0459 12/01/21 0631 12/02/21 0248 12/03/21 0543  NA 140 142 139 139 139 139 135  K 3.7 3.5 3.5 3.9 4.0 4.9 4.5  CL 110 113* 111 112* 112* 114* 110  CO2 20* 20* 19* 21* 20* 18* 18*  GLUCOSE 111* 157* 109* 102*  114* 149* 158*  BUN 78* 67* 65* 59* 59* 68* 77*  CREATININE 2.95* 2.71* 2.42* 2.50* 2.48* 2.68* 2.86*  CALCIUM 9.5 9.1 9.1 9.1 9.4 8.6* 8.6*  MG 2.4 2.1  --   --   --  2.3  --   PHOS 4.5  --   --   --  3.5  --   --     GFR: Estimated Creatinine Clearance: 14.9 mL/min (A) (by C-G formula based on SCr of 2.86 mg/dL (H)).  Liver Function Tests: Recent Labs  Lab 11/27/21 0218 11/28/21 0208 11/30/21 0459 12/01/21 0631 12/02/21 0248  AST 42* 51* 51*  --  34  ALT 50* 48* 52*  --  36  ALKPHOS 139* 125 113  --  88  BILITOT 0.8 0.3 0.4  --  0.7  PROT 6.5 5.7* 5.8*  --  5.3*  ALBUMIN 2.1* 1.8* 1.8* 1.9* 1.8*    CBG: Recent Labs  Lab 11/29/21 0848  GLUCAP 106*     Recent Results (from the past 240 hour(s))  SARS Coronavirus 2 by RT PCR  (hospital order, performed in [facility] hospital lab) *cepheid single result test* Urine, Clean Catch     Status: None   Collection Time: 11/25/21  4:42 PM   Specimen: Urine, Clean Catch; Nasal Swab  Result Value Ref Range Status   SARS Coronavirus 2 by RT PCR NEGATIVE NEGATIVE Final    Comment: (NOTE) SARS-CoV-2 target nucleic acids are NOT DETECTED.  The SARS-CoV-2 RNA is generally detectable in upper and lower respiratory specimens during the acute phase of infection. The lowest concentration of SARS-CoV-2 viral copies this assay can detect is 250 copies / mL. A negative result does not preclude SARS-CoV-2 infection and should not be used as the sole basis for treatment or other patient management decisions.  A negative result may occur with improper specimen collection / handling, submission of specimen other than nasopharyngeal swab, presence of viral mutation(s) within the areas targeted by this assay, and inadequate number of viral copies (<250 copies / mL). A negative result must be combined with clinical observations, patient history, and epidemiological information.  Fact Sheet for Patients:   https://www.patel.info/  Fact Sheet for Healthcare Providers: https://hall.com/  This test is not yet approved or  cleared by the [place] FDA and has been authorized for detection and/or diagnosis of SARS-CoV-2 by FDA under an Emergency Use Authorization (EUA).  This EUA will remain in effect (meaning this test can be used) for the duration of the COVID-19 declaration under Section 564(b)(1) of the Act, 21 U.S.C. section 360bbb-3(b)(1), unless the authorization is terminated or revoked sooner.  Performed at [facility], [place] [address] [place], [place] [address]   Surgical PCR screen     Status: None   Collection Time: 11/26/21  7:12 AM   Specimen: Nasal Mucosa; Nasal Swab  Result Value Ref Range Status   MRSA, PCR  NEGATIVE NEGATIVE Final   Staphylococcus aureus NEGATIVE NEGATIVE Final    Comment: (NOTE) The Xpert SA Assay (FDA approved for NASAL specimens in patients 61 years of age and older), is one component of a comprehensive surveillance program. It is not intended to diagnose infection nor to guide or monitor treatment. Performed at [facility], [place] [address] [place], [place] [address]   Aerobic/Anaerobic Culture w Gram Stain (surgical/deep wound)     Status: None (Preliminary result)   Collection Time: 12/01/21  5:08 PM   Specimen: PATH Other;  Tissue  Result Value Ref Range Status   Specimen Description FLUID  Final   Special Requests RIGHT HIP FLUID  Final   Gram Stain   Final    FEW WBC PRESENT,BOTH PMN AND MONONUCLEAR NO ORGANISMS SEEN    Culture   Final    CULTURE REINCUBATED FOR BETTER GROWTH Performed at [facility], [address] [place], [place] [address]    Report Status PENDING  Incomplete         Radiology Studies: [place] EKG SITE RITE  Result Date: 12/03/2021 If Site Rite image not attached, placement could not be confirmed due to current cardiac rhythm.  DG Pelvis Portable  Result Date: 12/01/2021 CLINICAL DATA:  Status post total hip arthroplasty EXAM: PORTABLE PELVIS 1-2 VIEWS COMPARISON:  CT 11/27/2021 FINDINGS: Since prior CT, there is been interval removal of the right hip prosthesis. Extensive bone destruction noted in the proximal femur. Cerclage wires are seen in the mid femoral shaft. Gas seen within the location of previous acetabular and femoral head component within the right hemipelvis, presumably postoperative. New large right inguinal hernia again noted containing bowel. IMPRESSION: Interval removal of prior right hip replacement with extensive bone destruction in the proximal right femur. Gas is seen in the right hemipelvis in previous location of acetabular component and femoral head component, presumably postoperative. Large right  inguinal hernia containing bowel. Electronically Signed   By: Rolm Baptise M.D.   On: 12/01/2021 20:17        Scheduled Meds:  allopurinol  100 mg Oral q morning   aspirin  81 mg Oral BID   carvedilol  3.125 mg Oral BID WC   docusate sodium  100 mg Oral BID   dorzolamide  1 drop Both Eyes BID   feeding supplement  237 mL Oral TID BM   ferrous sulfate  325 mg Oral TID PC   latanoprost  1 drop Both Eyes QHS   multivitamin  1 tablet Oral QHS   pantoprazole  40 mg Oral Q0600   senna-docusate  1 tablet Oral BID   Continuous Infusions:  sodium chloride 75 mL/hr at 12/03/21 9937   sodium chloride     cefTRIAXone (ROCEPHIN)  IV 2 g (12/03/21 0904)   [START ON 12/04/2021] DAPTOmycin (CUBICIN) 600 mg in sodium chloride 0.9 % IVPB     methocarbamol (ROBAXIN) IV         LOS: 8 days    Time spent: 35 minutes    Geradine Girt, DO [facility]   To contact the attending provider between 7A-7P or the covering provider during after hours 7P-7A, please log into the web site [email or web address] and access using universal [place] password for that web site. If you do not have the password, please call the hospital operator.  12/03/2021, 11:53 AM

## 2021-12-03 NOTE — Progress Notes (Incomplete)
PHARMACY CONSULT NOTE FOR:  OUTPATIENT  PARENTERAL ANTIBIOTIC THERAPY (OPAT)  Indication: R-hip PJI Regimen: Daptomycin 600 mg IV every 48 hours + Rocephin 2g IV every 24 hours End date: 01/12/22  IV antibiotic discharge orders are pended. To discharging provider:  please sign these orders via discharge navigator,  Select New Orders & click on the button choice - Manage This Unsigned Work.     Thank you for allowing pharmacy to be a part of this patient's care.  Alycia Rossetti, PharmD, BCPS Infectious Diseases Clinical Pharmacist 12/03/2021 3:29 PM   **Pharmacist phone directory can now be found on [email or web address] ([email or web address]).  Listed under [place].

## 2021-12-03 NOTE — Progress Notes (Signed)
[facility] for Infectious Disease   Reason for visit: Follow up on right hip infection  Interval History: no growth on culture to date; WBC 20, down from 32.8.   Day 3 total antibiotics  Physical Exam: Constitutional:  Vitals:   12/03/21 0328 12/03/21 0726  BP: (!) 146/58 (!) 136/53  Pulse: 81 74  Resp: 18 18  Temp: 97.7 F (36.5 C) 98 F (36.7 C)  SpO2: 99% 100%   patient appears in NAD Respiratory: Normal respiratory effort   Review of Systems: Constitutional: negative for fevers and chills  Lab Results  Component Value Date   WBC 20.0 (H) 12/03/2021   HGB 8.8 (L) 12/03/2021   HCT 27.3 (L) 12/03/2021   MCV 84.0 12/03/2021   PLT 490 (H) 12/03/2021    Lab Results  Component Value Date   CREATININE 2.86 (H) 12/03/2021   BUN 77 (H) 12/03/2021   NA 135 12/03/2021   K 4.5 12/03/2021   CL 110 12/03/2021   CO2 18 (L) 12/03/2021    Lab Results  Component Value Date   ALT 36 12/02/2021   AST 34 12/02/2021   ALKPHOS 88 12/02/2021     Microbiology: Recent Results (from the past 240 hour(s))  SARS Coronavirus 2 by RT PCR (hospital order, performed in [facility] hospital lab) *cepheid single result test* Urine, Clean Catch     Status: None   Collection Time: 11/25/21  4:42 PM   Specimen: Urine, Clean Catch; Nasal Swab  Result Value Ref Range Status   SARS Coronavirus 2 by RT PCR NEGATIVE NEGATIVE Final    Comment: (NOTE) SARS-CoV-2 target nucleic acids are NOT DETECTED.  The SARS-CoV-2 RNA is generally detectable in upper and lower respiratory specimens during the acute phase of infection. The lowest concentration of SARS-CoV-2 viral copies this assay can detect is 250 copies / mL. A negative result does not preclude SARS-CoV-2 infection and should not be used as the sole basis for treatment or other patient management decisions.  A negative result may occur with improper specimen collection / handling, submission of specimen other than nasopharyngeal  swab, presence of viral mutation(s) within the areas targeted by this assay, and inadequate number of viral copies (<250 copies / mL). A negative result must be combined with clinical observations, patient history, and epidemiological information.  Fact Sheet for Patients:   https://www.patel.info/  Fact Sheet for Healthcare Providers: https://hall.com/  This test is not yet approved or  cleared by the [place] FDA and has been authorized for detection and/or diagnosis of SARS-CoV-2 by FDA under an Emergency Use Authorization (EUA).  This EUA will remain in effect (meaning this test can be used) for the duration of the COVID-19 declaration under Section 564(b)(1) of the Act, 21 U.S.C. section 360bbb-3(b)(1), unless the authorization is terminated or revoked sooner.  Performed at [facility], [place] [address] [place], [place] [address]   Surgical PCR screen     Status: None   Collection Time: 11/26/21  7:12 AM   Specimen: Nasal Mucosa; Nasal Swab  Result Value Ref Range Status   MRSA, PCR NEGATIVE NEGATIVE Final   Staphylococcus aureus NEGATIVE NEGATIVE Final    Comment: (NOTE) The Xpert SA Assay (FDA approved for NASAL specimens in patients 66 years of age and older), is one component of a comprehensive surveillance program. It is not intended to diagnose infection nor to guide or monitor treatment. Performed at [facility], [place],  [place] [address]   Aerobic/Anaerobic Culture w Gram Stain (surgical/deep wound)     Status: None (Preliminary result)   Collection Time: 12/01/21  5:08 PM   Specimen: PATH Other; Tissue  Result Value Ref Range Status   Specimen Description FLUID  Final   Special Requests RIGHT HIP FLUID  Final   Gram Stain   Final    FEW WBC PRESENT,BOTH PMN AND MONONUCLEAR NO ORGANISMS SEEN    Culture   Final    CULTURE REINCUBATED FOR BETTER GROWTH Performed at [facility], [place] [address], [place], [place] [address]    Report Status PENDING  Incomplete    Impression/Plan:  1. Right prosthetic hip infection - no culture growth to date.  No plans for reimplantation with significant bone destruction.   Will continue with daptomycin and ceftriaxone and monitor cultures  2.  Access - he will need a picc line to continue antibiotics after discharge.  This has been requested.   3.  Acute on chronic renal insufficiency - improved with hydration.  He does have a baseline CC of < 50 but I do not anticipate that he would be a future dialysis candidate based on his age, comorbidities.  Therefore I have ordered a picc line rather than a tunneled catheter.

## 2021-12-03 NOTE — Progress Notes (Addendum)
Nutrition Follow-up  DOCUMENTATION CODES:   Severe malnutrition in context of chronic illness  INTERVENTION:   Continue to offer Ensure Enlive po TID, each supplement provides 350 kcal and 20 grams of protein.  Continue MVI with minerals daily.  NUTRITION DIAGNOSIS:   Severe Malnutrition related to chronic illness (CKD, dysphagia) as evidenced by severe muscle depletion, severe fat depletion.  Ongoing   GOAL:   Patient will meet greater than or equal to 90% of their needs  Progressing   MONITOR:   PO intake, Supplement acceptance, Weight trends, I & O's  REASON FOR ASSESSMENT:   Consult Poor PO  ASSESSMENT:   88 yo male admitted with failure to thrive and R hip pain r/t dislocation of internal right hip prosthesis. PMH includes CKD stage IV, HTN, HLD, anemia.  S/P bedside swallow evaluation with SLP 6/23. Dysphagia 2 diet with thin liquids ordered with recommendations to advance to regular textures at patient request.  6/22 - diet advanced to dysphagia 3 with thin liquids 6/23 - diet changed to dysphagia 2 with thin liquids 6/27 - S/P resection of R THA, debridement R hip 6/27 - diet advanced to regular with thin liquids post procedure  Patient's daughter in room reports she brought in breakfast for patient today (sausage and egg biscuit and potatoes from [employer]) and he ate about half of it. He is drinking Ensure supplements as well, but not eating much of the food sent on his meal trays. Daughter plans to bring patient meals from out of hospital.   Labs reviewed.   Medications reviewed and include Colace, ferrous sulfate, Rena-vit, Senokot-S. IVF: NS at 75 ml/h.   Diet Order:   Diet Order             Diet regular Room service appropriate? Yes; Fluid consistency: Thin  Diet effective now                   EDUCATION NEEDS:   No education needs have been identified at this time  Skin:  Skin Assessment: Reviewed RN Assessment (R hip incision s/p  surgery)  Last BM:  6/27  Height:   Ht Readings from Last 1 Encounters:  11/25/21 5\' 6"  (1.676 m)    Weight:   Wt Readings from Last 1 Encounters:  11/25/21 72.6 kg     BMI:  Body mass index is 25.82 kg/m.  Estimated Nutritional Needs:   Kcal:  2000-2200  Protein:  100-120 gm  Fluid:  >/= 2 L    Lucas Mallow RD, LDN, CNSC Please refer to Amion for contact information.

## 2021-12-03 NOTE — Progress Notes (Signed)
Physical Therapy Treatment Patient Details Name: Danny Rhodes MRN: [id number] DOB: 1927-02-18 Today's Date: 12/03/2021   History of Present Illness 88 y.o. male  with past medical history of glaucoma, HTN, gout, anemia due to CKD, and right hip prosthesis admitted on 11/25/2021 with right hip pain.  Patient has been diagnosed with dislocation of anterior right hip prosthesis. s/p Resection right total hip arthroplasty, Girdlestone procedure, and extensive excisional debridement    PT Comments    Skilled PT session today continued to focus on right LE strengthening and mobility progression. Pt needing less assistance this session vs previous eval session with less anxiety about moving. The pt is making steady progress. Acute PT to continue during pt's hospital stay.    Recommendations for follow up therapy are one component of a multi-disciplinary discharge planning process, led by the attending physician.  Recommendations may be updated based on patient status, additional functional criteria and insurance authorization.  Follow Up Recommendations  Home health PT     Assistance Recommended at Discharge Frequent or constant Supervision/Assistance  Patient can return home with the following A lot of help with walking and/or transfers;A lot of help with bathing/dressing/bathroom;Assistance with cooking/housework;Assist for transportation;Help with stairs or ramp for entrance   Equipment Recommendations  Rolling walker (2 wheels);BSC/3in1;Wheelchair (measurements PT);Wheelchair cushion (measurements PT);Hospital bed;Other (comment) (consider drop arm BSC- defer to OT)    Recommendations for Other Services OT consult     Precautions / Restrictions Precautions Precautions: Fall Precaution Comments: Transfers only Restrictions Weight Bearing Restrictions: Yes RLE Weight Bearing: Touchdown weight bearing Other Position/Activity Restrictions: TTWB for transfers only     Mobility  Bed  Mobility Overal bed mobility: Needs Assistance Bed Mobility: Supine to Sit     Supine to sit: Min assist     General bed mobility comments: with HOB 30 degrees- pt used PTA gait belt to self move right LE toward/off edge of bed. able to use his left LE to bridge hips over to EOB. then use of bed rail and HHA to elevate trunk into sitting position. Pt then only required PTA assist to support right LE as he scooted self to the edge of the bed.    Transfers Overall transfer level: Needs assistance   Transfers: Sit to/from Stand, Bed to chair/wheelchair/BSC Sit to Stand: Min assist, From elevated surface Stand pivot transfers: Min assist, Min guard         General transfer comment: bed elevated just high enough to get pt's hips higher than his knees. with cues for hand placement, min assist to stand bed>RW. with RW pt took pivot steps with no weight on right LE (toes barely touching floor at times) from bed to recliner chair with min guard assist for balance and min assist at times for walker positioning. once at the recliner, min assist for right LE support to sit controlled into recliner.         Cognition Arousal/Alertness: Awake/alert Behavior During Therapy: [name] for tasks assessed/performed Overall Cognitive Status: Within Functional Limits for tasks assessed               General Comments: Pleasant and paticipatory. Wants to do as much as he can without assistance and needs to move at his pace        Exercises General Exercises - Lower Extremity Ankle Circles/Pumps: AROM, Both, 10 reps, Supine Quad Sets: AROM, Strengthening, Right, 10 reps, Supine Straight Leg Raises: AAROM, Strengthening, Right, 10 reps, Supine     Pertinent Vitals/Pain  Pain Assessment Pain Assessment: Faces Faces Pain Scale: Hurts a little bit Pain Location: R hip, especially with movement Pain Descriptors / Indicators: Aching, Grimacing, Guarding Pain Intervention(s): Limited activity  within patient's tolerance, Monitored during session, Repositioned, Patient requesting pain meds-RN notified     PT Goals (current goals can now be found in the care plan section) Acute Rehab PT Goals Patient Stated Goal: Be able to get home PT Goal Formulation: With patient Time For Goal Achievement: 12/16/21 Potential to Achieve Goals: Good Additional Goals Additional Goal #1: Pt will propel wc 150 ft and manage parts with min assist Progress towards PT goals: Progressing toward goals    Frequency    Min 5X/week      PT Plan Current plan remains appropriate    AM-PAC PT "6 Clicks" Mobility   Outcome Measure  Help needed turning from your back to your side while in a flat bed without using bedrails?: A Little Help needed moving from lying on your back to sitting on the side of a flat bed without using bedrails?: A Little   Help needed standing up from a chair using your arms (e.g., wheelchair or bedside chair)?: A Little Help needed to walk in hospital room?: A Lot Help needed climbing 3-5 steps with a railing? : Total 6 Click Score: 12    End of Session Equipment Utilized During Treatment: Gait belt Activity Tolerance: Patient tolerated treatment well;Other (comment) (pt reported no change in pain, however wanted pain meds to "stay ahead of the pain") Patient left: in chair;with call bell/phone within reach Nurse Communication: Mobility status;Patient requests pain meds PT Visit Diagnosis: Unsteadiness on feet (R26.81);Other abnormalities of gait and mobility (R26.89);Pain Pain - Right/Left: Right Pain - part of body: Hip     Time: 9211-9417 PT Time Calculation (min) (ACUTE ONLY): 29 min  Charges:  $Therapeutic Exercise: 8-22 mins $Therapeutic Activity: 8-22 mins                     Willow Ora, PTA, [facility] Outpatient Neuro [facility] [address] [place], [place] [address] [phone] 12/03/21, 9:39 AM

## 2021-12-03 NOTE — TOC Progression Note (Signed)
Transition of Care [facility]) - Progression Note    Patient Details  Name: Danny Rhodes MRN: [id number] Date of Birth: 09/04/1926  Transition of Care [facility]) CM/SW [place], [place] Phone Number: 12/03/2021, 4:10 PM  Clinical Narrative:    CSW made plan to have family meeting at bedside in the AM to go over SNF vs. HH. TOC will continue to follow for DC plan.   Expected Discharge Plan:  (TBD) Barriers to Discharge: Continued Medical Work up  Expected Discharge Plan and Services Expected Discharge Plan:  (TBD) In-house Referral: Clinical Social Work   Post Acute Care Choice:  (TBD) Living arrangements for the past 2 months: Single Family Home                                       Social Determinants of Health (SDOH) Interventions    Readmission Risk Interventions     No data to display

## 2021-12-04 DIAGNOSIS — I129 Hypertensive chronic kidney disease with stage 1 through stage 4 chronic kidney disease, or unspecified chronic kidney disease: Secondary | ICD-10-CM | POA: Diagnosis not present

## 2021-12-04 DIAGNOSIS — Z515 Encounter for palliative care: Secondary | ICD-10-CM | POA: Diagnosis not present

## 2021-12-04 DIAGNOSIS — N184 Chronic kidney disease, stage 4 (severe): Secondary | ICD-10-CM | POA: Diagnosis not present

## 2021-12-04 DIAGNOSIS — B955 Unspecified streptococcus as the cause of diseases classified elsewhere: Secondary | ICD-10-CM

## 2021-12-04 DIAGNOSIS — E7800 Pure hypercholesterolemia, unspecified: Secondary | ICD-10-CM | POA: Diagnosis not present

## 2021-12-04 DIAGNOSIS — T84020A Dislocation of internal right hip prosthesis, initial encounter: Secondary | ICD-10-CM | POA: Diagnosis not present

## 2021-12-04 LAB — BASIC METABOLIC PANEL
Anion gap: 8 (ref 5–15)
BUN: 71 mg/dL — ABNORMAL HIGH (ref 8–23)
CO2: 17 mmol/L — ABNORMAL LOW (ref 22–32)
Calcium: 8.6 mg/dL — ABNORMAL LOW (ref 8.9–10.3)
Chloride: 112 mmol/L — ABNORMAL HIGH (ref 98–111)
Creatinine, Ser: 2.57 mg/dL — ABNORMAL HIGH (ref 0.61–1.24)
GFR, Estimated: 23 mL/min — ABNORMAL LOW (ref >60)
Glucose, Bld: 97 mg/dL (ref 70–99)
Potassium: 4.4 mmol/L (ref 3.5–5.1)
Sodium: 137 mmol/L (ref 135–145)

## 2021-12-04 LAB — CBC
HCT: 24.6 % — ABNORMAL LOW (ref 39.0–52.0)
Hemoglobin: 7.8 g/dL — ABNORMAL LOW (ref 13.0–17.0)
MCH: 26.9 pg (ref 26.0–34.0)
MCHC: 31.7 g/dL (ref 30.0–36.0)
MCV: 84.8 fL (ref 80.0–100.0)
Platelets: 447 10*3/uL — ABNORMAL HIGH (ref 150–400)
RBC: 2.9 MIL/uL — ABNORMAL LOW (ref 4.22–5.81)
RDW: 16.6 % — ABNORMAL HIGH (ref 11.5–15.5)
WBC: 16 10*3/uL — ABNORMAL HIGH (ref 4.0–10.5)
nRBC: 0 % (ref 0.0–0.2)

## 2021-12-04 MED ORDER — CHLORHEXIDINE GLUCONATE CLOTH 2 % EX PADS
6.0000 | MEDICATED_PAD | Freq: Every day | CUTANEOUS | Status: DC
Start: 2021-12-04 — End: 2021-12-05
  Administered 2021-12-04 – 2021-12-05 (×2): 6 via TOPICAL

## 2021-12-04 MED ORDER — SODIUM CHLORIDE 0.9% FLUSH: 10.0000 mL | INTRAVENOUS | Status: DC | PRN

## 2021-12-04 MED ORDER — EPOETIN ALFA-EPBX 10000 UNIT/ML IJ SOLN
20000.0000 [IU] | Freq: Once | INTRAMUSCULAR | Status: DC
  Filled 2021-12-04: qty 2

## 2021-12-04 MED ORDER — EPOETIN ALFA 20000 UNIT/ML IJ SOLN
20000.0000 [IU] | Freq: Once | INTRAMUSCULAR | Status: AC
  Administered 2021-12-04: 20000 [IU] via SUBCUTANEOUS
  Filled 2021-12-04 (×2): qty 1

## 2021-12-04 MED ORDER — SODIUM CHLORIDE 0.9% FLUSH
10.0000 mL | Freq: Two times a day (BID) | INTRAVENOUS | Status: DC
  Administered 2021-12-04 – 2021-12-05 (×3): 10 mL

## 2021-12-04 NOTE — Progress Notes (Signed)
Peripherally Inserted Central Catheter Placement  The IV Nurse has discussed with the patient and/or persons authorized to consent for the patient, the purpose of this procedure and the potential benefits and risks involved with this procedure.  The benefits include less needle sticks, lab draws from the catheter, and the patient may be discharged home with the catheter. Risks include, but not limited to, infection, bleeding, blood clot (thrombus formation), and puncture of an artery; nerve damage and irregular heartbeat and possibility to perform a PICC exchange if needed/ordered by physician.  Alternatives to this procedure were also discussed.  Bard Power PICC patient education guide, fact sheet on infection prevention and patient information card has been provided to patient /or left at bedside.    PICC Placement Documentation  PICC Single Lumen 12/04/21 Right Brachial 37 cm 0 cm (Active)  Indication for Insertion or Continuance of Line Home intravenous therapies (PICC only) 12/04/21 0942  Exposed Catheter (cm) 0 cm 12/04/21 0942  Site Assessment Clean, Dry, Intact 12/04/21 0942  Line Status Flushed;Saline locked;Blood return noted 12/04/21 0942  Dressing Type Transparent;Securing device 12/04/21 0942  Dressing Status Antimicrobial disc in place;Clean, Dry, Intact 12/04/21 0942  Safety Lock Not Applicable 52/84/13 2440  Dressing Intervention New dressing;Other (Comment) 12/04/21 0942  Dressing Change Due 12/11/21 12/04/21 0942       Enos Fling 12/04/2021, 9:43 AM

## 2021-12-04 NOTE — Progress Notes (Signed)
Physical Therapy Treatment Patient Details Name: Danny Rhodes MRN: [id number] DOB: 08-15-1926 Today's Date: 12/04/2021   History of Present Illness 88 y.o. male  with past medical history of glaucoma, HTN, gout, anemia due to CKD, and right hip prosthesis admitted on 11/25/2021 with right hip pain.  Patient has been diagnosed with dislocation of anterior right hip prosthesis. s/p Resection right total hip arthroplasty, Girdlestone procedure, and extensive excisional debridement    PT Comments    Pt instructed in and performed therapeutic activities including wheelchair training and transfer training. Pt required increased time and cues to complete tasks and maintain precautions. Pt's family present for learning.    Recommendations for follow up therapy are one component of a multi-disciplinary discharge planning process, led by the attending physician.  Recommendations may be updated based on patient status, additional functional criteria and insurance authorization.  Follow Up Recommendations  Home health PT     Assistance Recommended at Discharge Frequent or constant Supervision/Assistance  Patient can return home with the following A lot of help with walking and/or transfers;A lot of help with bathing/dressing/bathroom;Assistance with cooking/housework;Assist for transportation;Help with stairs or ramp for entrance   Equipment Recommendations  Rolling walker (2 wheels);BSC/3in1;Wheelchair (measurements PT);Wheelchair cushion (measurements PT);Hospital bed;Other (comment)    Recommendations for Other Services       Precautions / Restrictions Precautions Precautions: Fall Precaution Comments: Transfers only Restrictions Weight Bearing Restrictions: Yes RLE Weight Bearing: Touchdown weight bearing Other Position/Activity Restrictions: TDWB for transfers only     Mobility  Bed Mobility Overal bed mobility: Needs Assistance Bed Mobility: Supine to Sit     Supine to sit: Min  assist     General bed mobility comments: Assistance required for R LE management; increased time.    Transfers Overall transfer level: Needs assistance Equipment used: Rolling walker (2 wheels) Transfers: Sit to/from Stand Sit to Stand: Min assist, Mod assist Stand pivot transfers: Min assist         General transfer comment: Pt with difficulty adhering to WB precautions and required increased cues. Pt performed stand pivot transfer from bed to wheelchair and from wheelchair to chair. Moderate assistance provided for sit to stand from wheelchair and minimal from bed.    Ambulation/Gait                   Stairs Stairs: Yes Stairs assistance: Total assist Stair Management: Wheelchair Number of Stairs: 1 (1 x 2) General stair comments: Pt ascended/descended platform step with WC and therapist assist. Family present for education and handout provided.   Information systems manager mobility: Yes Wheelchair propulsion: Both upper extremities Wheelchair parts: Supervision/cueing Distance: 225 Wheelchair Assistance Details (indicate cue type and reason): Pt required increased cues for turning and sequencing. Education provided on wheelchair features.   Modified Rankin (Stroke Patients Only)       Balance Overall balance assessment: Needs assistance Sitting-balance support: No upper extremity supported, Feet supported Sitting balance-[name]: Good     Standing balance support: Single extremity supported, Bilateral upper extremity supported, During functional activity Standing balance-[name]: Poor Standing balance comment: reliant on UE support to maintain WB precautions                            Cognition Arousal/Alertness: Awake/alert Behavior During Therapy: [name] for tasks assessed/performed Overall Cognitive Status: Within Functional Limits for tasks assessed  General  Comments: Pleasant and paticipatory but requires increased time to complete tasks. Slow processing present.        Exercises      General Comments General comments (skin integrity, edema, etc.): Pt's wife and DTR present      Pertinent Vitals/Pain Pain Assessment Pain Assessment: No/denies pain Pain Location: R hip Pain Descriptors / Indicators: Aching, Grimacing, Guarding Pain Intervention(s): Limited activity within patient's tolerance, Monitored during session    Home Living                          Prior Function            PT Goals (current goals can now be found in the care plan section) Acute Rehab PT Goals Patient Stated Goal: Be able to get home PT Goal Formulation: With patient Time For Goal Achievement: 12/16/21 Potential to Achieve Goals: Good Progress towards PT goals: Progressing toward goals    Frequency    Min 5X/week      PT Plan Current plan remains appropriate    Co-evaluation              AM-PAC PT "6 Clicks" Mobility   Outcome Measure  Help needed turning from your back to your side while in a flat bed without using bedrails?: A Little Help needed moving from lying on your back to sitting on the side of a flat bed without using bedrails?: A Little Help needed moving to and from a bed to a chair (including a wheelchair)?: A Little Help needed standing up from a chair using your arms (e.g., wheelchair or bedside chair)?: A Lot Help needed to walk in hospital room?: Total Help needed climbing 3-5 steps with a railing? : Total 6 Click Score: 13    End of Session Equipment Utilized During Treatment: Gait belt Activity Tolerance: Patient tolerated treatment well Patient left: in chair;Other (comment);with family/visitor present (with OT present for her treatment)   PT Visit Diagnosis: Unsteadiness on feet (R26.81);Other abnormalities of gait and mobility (R26.89);Pain Pain - Right/Left: Right Pain - part of body: Hip      Time: 4599-7741 PT Time Calculation (min) (ACUTE ONLY): 40 min  Charges:  $Therapeutic Activity: 38-52 mins                     Donna Bernard, PT    [facility] 12/04/2021, 11:45 AM

## 2021-12-04 NOTE — Plan of Care (Signed)
  Problem: Pain Managment: Goal: General experience of comfort will improve Outcome: Progressing   Problem: Safety: Goal: Ability to remain free from injury will improve Outcome: Progressing   Problem: Skin Integrity: Goal: Risk for impaired skin integrity will decrease Outcome: Progressing   Problem: Pain Management: Goal: Pain level will decrease with appropriate interventions Outcome: Progressing   Problem: Skin Integrity: Goal: Will show signs of wound healing Outcome: Progressing

## 2021-12-04 NOTE — Progress Notes (Signed)
PHARMACY CONSULT NOTE FOR:  OUTPATIENT  PARENTERAL ANTIBIOTIC THERAPY (OPAT)  Indication: Strep R-hip PJI Regimen: Rocephin 2g IV every 24 hours End date: 01/12/22  IV antibiotic discharge orders are pended. To discharging provider:  please sign these orders via discharge navigator,  Select New Orders & click on the button choice - Manage This Unsigned Work.     Thank you for allowing pharmacy to be a part of this patient's care.  Alycia Rossetti, PharmD, BCPS Infectious Diseases Clinical Pharmacist 12/04/2021 10:15 AM   **Pharmacist phone directory can now be found on [email or web address] ([email or web address]).  Listed under [place].

## 2021-12-04 NOTE — TOC Progression Note (Signed)
Transition of Care [facility]) - Progression Note    Patient Details  Name: SHLOMIE ROMIG MRN: [id number] Date of Birth: 05/04/1927  Transition of Care [facility]) CM/SW [place], [place] Phone Number: 12/04/2021, 11:22 AM  Clinical Narrative:     CSW met with pt and family at bedside to discuss disposition. Questions were answered and options provided. Family decided that they would like to take pt home. CSW alerted CM to possible needs for the family, and she will follow . TOC will continue to follow for DC needs.  Expected Discharge Plan: [place] Barriers to Discharge: Continued Medical Work up  Expected Discharge Plan and Services Expected Discharge Plan: [place] In-house Referral: Clinical Social Work Discharge Planning Services: CM Consult Post Acute Care Choice:  (TBD) Living arrangements for the past 2 months: [place]                 DME Arranged: 3-N-1, Hospital bed, Wheelchair manual (IV ABX [facility] Jeannene Patella)) DME Agency: [employer] Date DME Agency Contacted: 12/04/21 Time DME Agency Contacted: [phone] Representative spoke with at DME Agency: [place]: RN [place] Agency: [place] Date [place]: 12/04/21 Time [place]: [place] Representative spoke with at [place]: [place] ([place]) Interventions    Readmission Risk Interventions     No data to display

## 2021-12-04 NOTE — Progress Notes (Signed)
This patient receives Retacrit (epoetin) 20,000 units Q 28 days outpatient. Currently past due for dose, discussed with Dr. Eliseo Squires, will give Procrit (epoetin biosimilar) dose while inpatient due to backorder access issues to Retacrit.   [facility] aware patient is admitted and not able to attend currently scheduled appointment. Will retime appointment based on Procrit dose administered today per confirmation with RN. Rescheduling for 28 days from dose.  If you have questions about this conversion, please contact the pharmacy department.   Thank you for allowing pharmacy to be a part of this patient's care.  Ardyth Harps, PharmD Clinical Pharmacist

## 2021-12-04 NOTE — Progress Notes (Addendum)
   Palliative Medicine Inpatient Follow Up Note  HPI: 88 y.o. male  with past medical history of glaucoma, HTN, gout, anemia due to CKD, and right hip prosthesis admitted on 11/25/2021 with right hip pain.  Patient has been diagnosed with dislocation of anterior right hip prosthesis.  Orthopedics is to evaluate but has initially recommended a nerve block.Patient is being treated for right hip pain as well as failure to thrive, renal failure, leukocytosis, and transaminitis.Palliative medicine team was consulted to discuss goals of care.  Today's Discussion 12/04/2021  *Please note that this is a verbal dictation therefore any spelling or grammatical errors are due to the "[employer]" system interpretation.  Chart reviewed inclusive of vital signs, progress notes, laboratory results, and diagnostic images.   S/P excision  --> R hip arthroplasty, (+) prosthetic infection. On Rocephin for six weeks.   I met with Dolores this morning, he shares with me that he is comfortable and in no pain or distress.  He vocalizes thankfulness for the staff and caring for him over the past 9 days.  Abrahm is aware that he is getting treatment of a hip infection.  He shares that his pain has decreased significantly since surgery.  Domenic continues to express the desire to improve his physical/mobility function.  Per primary team plan will be for discharge in the oncoming days likely to home with antibiotics which will be completed on 8th of August.  Palliative care will continue to incrementally check in on [place] the goals are clear.   Objective Assessment: Vital Signs Vitals:   12/04/21 0542 12/04/21 0811  BP: (!) 135/54 (!) 140/48  Pulse: 67 68  Resp: 17   Temp: 98.4 F (36.9 C) 98.4 F (36.9 C)  SpO2: 100% 100%    Intake/Output Summary (Last 24 hours) at 12/04/2021 1119 Last data filed at 12/04/2021 0545 Gross per 24 hour  Intake 1142.13 ml  Output 1200 ml  Net -57.87 ml    Last  Weight  Most recent update: 11/25/2021  9:48 AM    Weight  72.6 kg (160 lb)            Gen: Frail elderly African-American male in no acute distress HEENT: moist mucous membranes CV: Regular rate and rhythm PULM: On room air breathing is even and nonlabored ABD: soft/nontender EXT: No edema Neuro: Alert and oriented x3  SUMMARY OF RECOMMENDATIONS   DNAR/DNI  Continue current scope of care allowing time for outcomes  Ongoing palliative care support incrementally ______________________________________________________________________________________ [place] Team Team Cell Phone: [phone] Please utilize secure chat with additional questions, if there is no response within 30 minutes please call the above phone number  Palliative Medicine Team providers are available by phone from 7am to 7pm daily and can be reached through the team cell phone.  Should this patient require assistance outside of these hours, please call the patient's attending physician.

## 2021-12-04 NOTE — Progress Notes (Signed)
Occupational Therapy Treatment  Pt seen to assist back to chair s/p completion of ADLs at sink via wheelchair level. Pt able to demo ability to navigate around room to access closet, drawers, etc from wheelchair level without difficulty. Assisted pt in transfer back to chair using RW at min guard and continued good adherence to WB precautions.    12/04/21 1253  OT Visit Information  Last OT Received On 12/04/21  Assistance Needed +1  History of Present Illness 88 y.o. male  with past medical history of glaucoma, HTN, gout, anemia due to CKD, and right hip prosthesis admitted on 11/25/2021 with right hip pain.  Patient has been diagnosed with dislocation of anterior right hip prosthesis. s/p Resection right total hip arthroplasty, Girdlestone procedure, and extensive excisional debridement  Precautions  Precautions Fall  Restrictions  Weight Bearing Restrictions Yes  RLE Weight Bearing TWB  Other Position/Activity Restrictions TDWB for transfers only  Pain Assessment  Pain Assessment Faces  Faces Pain Scale 2  Pain Location R hip  Pain Descriptors / Indicators Grimacing  Pain Intervention(s) Monitored during session  Cognition  Arousal/Alertness Awake/alert  Behavior During Therapy [name] for tasks assessed/performed  Overall Cognitive Status Within Functional Limits for tasks assessed  Upper Extremity Assessment  Upper Extremity Assessment Overall [name] for tasks assessed  Lower Extremity Assessment  Lower Extremity Assessment Defer to PT evaluation  Vision- Assessment  Vision Assessment? No apparent visual deficits  ADL  Overall ADL's  Needs assistance/impaired  Upper Body Dressing  Set up;Sitting  Upper Body Dressing Details (indicate cue type and reason) to don new gown after water got on gown s/p shaving task  General ADL Comments Pt completed UB ADLs at sink in wheelchair. Encouraged trial of accessing closet door and drawers via wheelchair level with pt able to return demo well   Bed Mobility  General bed mobility comments received in wc in bathroom  Transfers  Overall transfer level Needs assistance  Equipment used Rolling walker (2 wheels)  Transfers Sit to/from Stand;Bed to chair/wheelchair/BSC  Sit to Stand Min guard  Bed to/from chair/wheelchair/BSC transfer type: Step pivot  Stand pivot transfers Min guard  General transfer comment increased time to rise though good UB strength noted without LOB, minor cues for shifting weight and hand placement sequencing. transfer from w/c to recliner  Balance  Overall balance assessment Needs assistance  Sitting-balance support No upper extremity supported;Feet supported  Sitting balance-[name] Good  Standing balance support Bilateral upper extremity supported;During functional activity  Standing balance-[name] Poor  Standing balance comment reliant on UE support to maintain WB precautions  General Comments  General comments (skin integrity, edema, etc.) Educated pt/family on wheelchair cushion levels, recommendations depending on how long pt will be up in chair and if any skin integrity concerns (does not currently have a wound)  OT - End of Session  Equipment Utilized During Treatment Rolling walker (2 wheels)  Activity Tolerance Patient tolerated treatment well  Patient left in chair;with call bell/phone within reach;with family/visitor present  Nurse Communication Mobility status  OT Assessment/Plan  OT Plan Discharge plan remains appropriate  OT Visit Diagnosis Unsteadiness on feet (R26.81);Other abnormalities of gait and mobility (R26.89);Pain  Pain - Right/Left Right  Pain - part of body Hip  OT Frequency (ACUTE ONLY) Min 2X/week  Follow Up Recommendations Home health OT  Assistance recommended at discharge Intermittent Supervision/Assistance  Patient can return home with the following A little help with walking and/or transfers;A little help with bathing/dressing/bathroom;Assistance with  cooking/housework;Assist for transportation;Help with stairs or ramp for entrance  OT Equipment BSC/3in1;Wheelchair (measurements OT);Wheelchair cushion (measurements OT);Other (comment) (AE for LB ADLs)  AM-PAC OT "6 Clicks" Daily Activity Outcome Measure (Version 2)  Help from another person eating meals? 4  Help from another person taking care of personal grooming? 3  Help from another person toileting, which includes using toliet, bedpan, or urinal? 3  Help from another person bathing (including washing, rinsing, drying)? 2  Help from another person to put on and taking off regular upper body clothing? 3  Help from another person to put on and taking off regular lower body clothing? 2  6 Click Score 17  Progressive Mobility  What is the highest level of mobility based on the progressive mobility assessment? Level 3 (Stands with assist) - Balance while standing  and cannot march in place  Activity Stood at bedside  OT Goal Progression  Progress towards OT goals Progressing toward goals  Acute Rehab OT Goals  Patient Stated Goal home soon  OT Goal Formulation With patient/family  Time For Goal Achievement 12/16/21  Potential to Achieve Goals Good  ADL Goals  Pt Will Perform Lower Body Bathing with modified independence;sitting/lateral leans;sit to/from stand;with adaptive equipment  Pt Will Perform Lower Body Dressing with modified independence;with adaptive equipment;sitting/lateral leans;sit to/from stand  Pt Will Transfer to Toilet with modified independence;stand pivot transfer  Additional ADL Goal #1 Pt to demo ability to gather ADL/IADL items MOD I at wheelchair level  OT Time Calculation  OT Start Time (ACUTE ONLY) 1200  OT Stop Time (ACUTE ONLY) 1216  OT Time Calculation (min) 16 min  OT General Charges  $OT Visit 1 Visit  OT Treatments  $Therapeutic Activity 8-22 mins

## 2021-12-04 NOTE — Progress Notes (Signed)
[facility] for [facility]ious [facility]   Reason for visit: Follow up on right prosthetic hip infection s/p operative debridement  Interval History: no new complaints.  Culture growth with Strep salivarius, pansensitive.  Daughter and wife at bedside   Physical Exam: Constitutional:  Vitals:   12/04/21 0542 12/04/21 0811  BP: (!) 135/54 (!) 140/48  Pulse: 67 68  Resp: 17   Temp: 98.4 F (36.9 C) 98.4 F (36.9 C)  SpO2: 100% 100%   patient appears in NAD Respiratory: Normal respiratory effort; CTA B Cardiovascular: RRR   Review of Systems: Constitutional: negative for fevers and chills  Lab Results  Component Value Date   WBC 16.0 (H) 12/04/2021   HGB 7.8 (L) 12/04/2021   HCT 24.6 (L) 12/04/2021   MCV 84.8 12/04/2021   PLT 447 (H) 12/04/2021    Lab Results  Component Value Date   CREATININE 2.57 (H) 12/04/2021   BUN 71 (H) 12/04/2021   NA 137 12/04/2021   K 4.4 12/04/2021   CL 112 (H) 12/04/2021   CO2 17 (L) 12/04/2021    Lab Results  Component Value Date   ALT 36 12/02/2021   AST 34 12/02/2021   ALKPHOS 88 12/02/2021     Microbiology: Recent Results (from the past 240 hour(s))  SARS Coronavirus 2 by RT PCR (hospital order, performed in [facility] hospital lab) *cepheid single result test* Urine, Clean Catch     Status: None   Collection Time: 11/25/21  4:42 PM   Specimen: Urine, Clean Catch; Nasal Swab  Result Value Ref Range Status   SARS Coronavirus 2 by RT PCR NEGATIVE NEGATIVE Final    Comment: (NOTE) SARS-CoV-2 target nucleic acids are NOT DETECTED.  The SARS-CoV-2 RNA is generally detectable in upper and lower respiratory specimens during the acute phase of infection. The lowest concentration of SARS-CoV-2 viral copies this assay can detect is 250 copies / mL. A negative result does not preclude SARS-CoV-2 infection and should not be used as the sole basis for treatment or other patient management decisions.  A negative result may occur  with improper specimen collection / handling, submission of specimen other than nasopharyngeal swab, presence of viral mutation(s) within the areas targeted by this assay, and inadequate number of viral copies (<250 copies / mL). A negative result must be combined with clinical observations, patient history, and epidemiological information.  Fact Sheet for Patients:   https://www.patel.info/  Fact Sheet for Healthcare Providers: https://hall.com/  This test is not yet approved or  cleared by the [place] FDA and has been authorized for detection and/or diagnosis of SARS-CoV-2 by FDA under an Emergency Use Authorization (EUA).  This EUA will remain in effect (meaning this test can be used) for the duration of the COVID-19 declaration under Section 564(b)(1) of the Act, 21 U.S.C. section 360bbb-3(b)(1), unless the authorization is terminated or revoked sooner.  Performed at [facility], [place] [address] [place], [place] [address]   Surgical PCR screen     Status: None   Collection Time: 11/26/21  7:12 AM   Specimen: Nasal Mucosa; Nasal Swab  Result Value Ref Range Status   MRSA, PCR NEGATIVE NEGATIVE Final   Staphylococcus aureus NEGATIVE NEGATIVE Final    Comment: (NOTE) The Xpert SA Assay (FDA approved for NASAL specimens in patients 52 years of age and older), is one component of a comprehensive surveillance program. It is not intended to diagnose infection nor to guide or monitor treatment. Performed at  [facility], [place] [address], [place], [place] [address]   Aerobic/Anaerobic Culture w Gram Stain (surgical/deep wound)     Status: None (Preliminary result)   Collection Time: 12/01/21  5:08 PM   Specimen: PATH Other; Tissue  Result Value Ref Range Status   Specimen Description FLUID  Final   Special Requests RIGHT HIP FLUID  Final   Gram Stain   Final    FEW WBC PRESENT,BOTH PMN AND MONONUCLEAR NO ORGANISMS  SEEN Performed at [facility], [address], [place], [place] [address]    Culture   Final    RARE STREPTOCOCCUS SALIVARIUS NO ANAEROBES ISOLATED; CULTURE IN PROGRESS FOR 5 DAYS    Report Status PENDING  Incomplete   Organism ID, Bacteria STREPTOCOCCUS SALIVARIUS  Final      Susceptibility   Streptococcus salivarius - MIC*    PENICILLIN <=0.06 SENSITIVE Sensitive     CEFTRIAXONE <=0.12 SENSITIVE Sensitive     ERYTHROMYCIN <=0.12 SENSITIVE Sensitive     LEVOFLOXACIN 2 SENSITIVE Sensitive     VANCOMYCIN 0.5 SENSITIVE Sensitive     * RARE STREPTOCOCCUS SALIVARIUS    Impression/Plan:  1. 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  No MRSA growth so will keep his treatment broad with ceftriaxone and will stop the daptomycin. No plans for reimplantation noted Plan for 6 weeks from the time of surgery.  We can consider changing him to oral therapy such as cefadroxil 1000 mg twice a day if there are any issues with the picc line or with the IV antibiotics prior to completion of the 6 weeks.   Dr. Alvan Dame is considering a follow up CT scan to reevaluate the hip for any further infection concerns.      2.  Access-  picc line in place.    Diagnosis: PJI right hip s/p resection arthroplasty and extensive bone and tissue debridement.    Culture Result: Strep salivarius, pan-sensitive  No Known Allergies  OPAT Orders Discharge antibiotics to be given via PICC line Discharge antibiotics: ceftriaxone 2 grams IV every 24 hours Per pharmacy protocol yes Duration: 6 weeks End Date: 01/12/22  [facility] Care Per Protocol: yes  Home health RN for IV administration and teaching; PICC line care and labs.    Labs weekly while on IV antibiotics: __x CBC with differential __ BMP _x_ CMP _x_ CRP _x_ ESR __ Vancomycin trough __ CK  _x_ Please pull PIC at completion of IV antibiotics __ Please leave  PIC in place until doctor has seen patient or been notified  Fax weekly labs to [phone]  Clinic Follow Up Appt: Dr. Candiss Norse July 24  @ 2:30 pm

## 2021-12-04 NOTE — Progress Notes (Signed)
Occupational Therapy Treatment Patient Details Name: Danny Rhodes MRN: [id number] DOB: January 28, 1927 Today's Date: 12/04/2021   History of present illness 88 y.o. male  with past medical history of glaucoma, HTN, gout, anemia due to CKD, and right hip prosthesis admitted on 11/25/2021 with right hip pain.  Patient has been diagnosed with dislocation of anterior right hip prosthesis. s/p Resection right total hip arthroplasty, Girdlestone procedure, and extensive excisional debridement   OT comments  Session focused on introduction of wheelchair with ADLs with emphasis on w/c mgmt, maneuvering through doorways and in tight spaces to maximize independence with daily tasks in the home. Pt able to transfer to wheelchair using RW at min guard assist, demonstrate appropriate w/c brake mgmt and complete various UB ADLs seated at sink in wheelchair. Pt left in bathroom to finish UB ADLs d/t increased time needed. Family present and engaged throughout.    Recommendations for follow up therapy are one component of a multi-disciplinary discharge planning process, led by the attending physician.  Recommendations may be updated based on patient status, additional functional criteria and insurance authorization.    Follow Up Recommendations  Home health OT    Assistance Recommended at Discharge Intermittent Supervision/Assistance  Patient can return home with the following  A little help with walking and/or transfers;A little help with bathing/dressing/bathroom;Assistance with cooking/housework;Assist for transportation;Help with stairs or ramp for entrance   Equipment Recommendations  BSC/3in1;Wheelchair (measurements OT);Wheelchair cushion (measurements OT);Other (comment) (AE for LB ADLs)    Recommendations for Other Services      Precautions / Restrictions Precautions Precautions: Fall Precaution Comments: Transfers only Restrictions Weight Bearing Restrictions: Yes RLE Weight Bearing: Touchdown  weight bearing Other Position/Activity Restrictions: TDWB for transfers only       Mobility Bed Mobility               General bed mobility comments: up in chair on entry    Transfers Overall transfer level: Needs assistance Equipment used: Rolling walker (2 wheels) Transfers: Sit to/from Stand, Bed to chair/wheelchair/BSC Sit to Stand: Min guard Stand pivot transfers: Min guard         General transfer comment: increased time to rise though good UB strength noted without LOB, minor cues for shifting weight and hand placement sequencing     Balance Overall balance assessment: Needs assistance Sitting-balance support: No upper extremity supported, Feet supported Sitting balance-[name]: Good     Standing balance support: Bilateral upper extremity supported, During functional activity Standing balance-[name]: Poor Standing balance comment: reliant on UE support to maintain WB precautions                           ADL either performed or assessed with clinical judgement   ADL Overall ADL's : Needs assistance/impaired     Grooming: Set up;Applying deodorant;Wash/dry face Grooming Details (indicate cue type and reason): shaving task completed as well. pt able to manage well at w/c level for grooming tasks at sink Upper Body Bathing: Set up;Sitting                             General ADL Comments: Emphasis on ADLs at a wheelchair level with mobility into bathroom, managing tasks at sink via wheelchair. Pt with good negotiation up incline into bathroom and lock mgmt    Extremity/Trunk Assessment Upper Extremity Assessment Upper Extremity Assessment: Overall [name] for tasks assessed   Lower Extremity  Assessment Lower Extremity Assessment: Defer to PT evaluation        Vision   Vision Assessment?: No apparent visual deficits   Perception     Praxis      Cognition Arousal/Alertness: Awake/alert Behavior During Therapy: [name] for  tasks assessed/performed Overall Cognitive Status: Within Functional Limits for tasks assessed                                          Exercises      Shoulder Instructions       General Comments wife and daughter present, engaged throughout with good questions regarding DME    Pertinent Vitals/ Pain       Pain Assessment Pain Assessment: Faces Faces Pain Scale: Hurts a little bit Pain Location: R hip Pain Descriptors / Indicators: Grimacing Pain Intervention(s): Monitored during session  Home Living                                          Prior Functioning/Environment              Frequency  Min 2X/week        Progress Toward Goals  OT Goals(current goals can now be found in the care plan section)  Progress towards OT goals: Progressing toward goals  Acute Rehab OT Goals Patient Stated Goal: go home soon OT Goal Formulation: With patient/family Time For Goal Achievement: 12/16/21 Potential to Achieve Goals: Good ADL Goals Pt Will Perform Lower Body Bathing: with modified independence;sitting/lateral leans;sit to/from stand;with adaptive equipment Pt Will Perform Lower Body Dressing: with modified independence;with adaptive equipment;sitting/lateral leans;sit to/from stand Pt Will Transfer to Toilet: with modified independence;stand pivot transfer Additional ADL Goal #1: Pt to demo ability to gather ADL/IADL items MOD I at wheelchair level  Plan Discharge plan remains appropriate    Co-evaluation                 AM-PAC OT "6 Clicks" Daily Activity     Outcome Measure   Help from another person eating meals?: None Help from another person taking care of personal grooming?: A Little Help from another person toileting, which includes using toliet, bedpan, or urinal?: A Little Help from another person bathing (including washing, rinsing, drying)?: A Lot Help from another person to put on and taking off regular  upper body clothing?: A Little Help from another person to put on and taking off regular lower body clothing?: A Lot 6 Click Score: 17    End of Session Equipment Utilized During Treatment: Gait belt;Rolling walker (2 wheels)  OT Visit Diagnosis: Unsteadiness on feet (R26.81);Other abnormalities of gait and mobility (R26.89);Pain Pain - Right/Left: Right Pain - part of body: Hip   Activity Tolerance Patient tolerated treatment well   Patient Left with family/visitor present;Other (comment) (in w/c at sink to complete UB ADLs)   Nurse Communication Mobility status        Time: 1610-9604 OT Time Calculation (min): 36 min  Charges: OT General Charges $OT Visit: 1 Visit OT Treatments $Self Care/Home Management : 23-37 mins  Malachy Chamber, OTR/L Acute Rehab Services Office: [phone]   Layla Maw 12/04/2021, 12:46 PM

## 2021-12-04 NOTE — Progress Notes (Signed)
PROGRESS NOTE    Danny Rhodes  OAC:[id number] DOB: 03-12-1927 DOA: 11/25/2021 PCP: [name], MD    Chief Complaint  Patient presents with   Failure To Thrive    Brief Narrative:  Patient is 88 year old gentleman history of CKD stage IV, HTN, hyperlipidemia, anemia of chronic disease, presented to the ED with complaints of right hip pain and failure to thrive over the past few weeks.  Patient noted to have had right hip pain for about the past 5 years that has progressively been getting worse and started to use a cane about 1.5 years ago and as well as a walker about a year ago.  Patient noted to have woken up on June 8 with severe pain and chills not feeling well and no appetite.  ? Infected hip so ID consulted for recommendations.  Plan for home with 6 weeks of IV abx.   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 Alvan Dame who reviewed films, assessed patient, recommending CT scan for further evaluation.   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  Bone extensively destroyed and prostheses are loose and there are  large associated soft tissue masses (pseudotumor), right femoral prosthesis dislocated, one of the pseudotumors projects into the pelvis and has significant mass effect on the rectum and bladder.  Also contains gas and could be secondary infected.  Very large right inguinal hernia containing bowel. -Orthopedics at this point recommending surgical intervention for pain management/palliation and recommending touchdown weightbearing on right lower extremity as tolerated with PT pending CT review per orthopedics- surgery delayed -procedure 12/01/2021. -Continue current pain management with Norco and Dilaudid as needed with CKD. -Continue scheduled Tylenol. -SCDs. -PT/OT. -ID consult: ceftriaxone 2 grams IV every 24 hours Picc line Plan for 4-6 weeks antibiotics -TOC consulted for d/c planning  Acute renal failure superimposed on stage 4 chronic kidney disease ([place]) Likely due to prerenal azotemia secondary to FTT with poor PO intake over the past 2 weeks, no hx of exogenous losses or urinary complaints Strict I/O Urinalysis unremarkable. Bladder scan with no urine  -Baseline creatinine approximately 2.3-2.5. Hold/avoid nephrotoxic drugs. Palliative care consulted and following. -will give outpatient epogen dose  FTT (failure to thrive) in adult History of weight loss, poor PO intake and overall FTT Nutrition consult, palliative care consult, SLP eval and patient currently on a dysphagia 2 diet.  Dysphagia New complaint since June 8th. Can not differentiate if oral vs. Pharyngeal/esophageal Good laryngeal elevation with no s/s of aspiration Can swallow liquids fine and grapefruit -States he feels the food balls up in his mouth and just does not  go down. -SLP has evaluated the patient and patient to be placed on a dysphagia 2 diet.   -Per SLP patient sometimes feels food may get regurgitated.   -Barium esophagram done with mild intermittent esophageal dysmotility with tertiary contractions,  small hiatal hernia, small volume GERD observed at the level of lower esophagus otherwise unremarkable esophagram.  -Patient tolerating current diet.  -Continue dysphagia 2 diet. -Continue PPI.   Leukocytosis No signs of symptoms of infection No fever/chills -trending down -Chest x-ray negative for any acute cardiopulmonary disease. -Urinalysis unremarkable. -Patient afebrile.   -Follow.  Transaminitis Mildly elevated History of not feeling well, fatigue, COVID test negative.  Gentle IVF. -LFTs trending down.  Essential hypertension Controlled on current regimen Coreg.  -held norvasc to avoid hypotension  History of anemia due to chronic kidney disease Baseline around 10, -Hemoglobin at seems to be stabilizing at 9.6 today, likely dilutional component as patient with no overt bleeding. -Follow H&H. -Transfusion threshold hemoglobin < 7. Continue to monitor   Gout - Stable.   -No acute flare.   -Allopurinol.   Protein-calorie malnutrition, severe - Patient currently tolerating a dysphagia 2 diet.  -Ensure supplementation. -Follow.    DVT prophylaxis: DVT prophylaxis per orthopedics. Code Status: DNR Family Communication: family at bedside Disposition: TBD  Status is: Inpatient Remains inpatient appropriate because: Severity of illness   Consultants:  Orthopedics: Hilbert Odor, PA 11/25/2021 Palliative care: Dr. Hilma Favors 11/26/2021  Procedures:  Chest x-ray 11/25/2021 Plain films of the right hip and pelvis 11/25/2021 Barium esophagram 11/27/2021 CT right hip 11/27/2021     Subjective: No current complaints  Objective: Vitals:   12/03/21 0726 12/03/21 1937 12/04/21 0542 12/04/21 0811  BP: (!) 136/53 (!) 137/57 (!) 135/54 (!) 140/48  Pulse: 74 77 67 68  Resp: 18 17 17    Temp: 98 F (36.7 C) 98.3 F (36.8 C) 98.4 F (36.9 C) 98.4 F (36.9 C)  TempSrc: Oral Oral Oral Oral  SpO2: 100% 100% 100% 100%  Weight:      Height:        Intake/Output  Summary (Last 24 hours) at 12/04/2021 1223 Last data filed at 12/04/2021 0545 Gross per 24 hour  Intake 1142.13 ml  Output 1200 ml  Net -57.87 ml   Filed Weights   11/25/21 0947  Weight: 72.6 kg    Examination:    General: Appearance:     Overweight male in no acute distress     Lungs:     respirations unlabored  Heart:    Normal heart rate. Normal rhythm. No murmurs, rubs, or gallops.   MS:   All extremities are intact.   Neurologic:   Awake, alert, oriented x 3. No apparent focal neurological           defect.       Data Reviewed:   CBC: Recent Labs  Lab 11/28/21 0208 11/29/21 0352 11/30/21 0459 12/01/21 0631 12/02/21 0248 12/03/21 0543 12/04/21 0852  WBC 13.3*   < > 14.2* 13.4* 32.8* 20.0* 16.0*  NEUTROABS 11.2*  --  11.8*  --  30.4*  --   --   HGB 8.7*   < > 9.3* 9.6* 11.2* 8.8* 7.8*  HCT 28.2*   < > 29.5* 31.5* 34.9* 27.3* 24.6*  MCV 82.9   < > 84.5 85.6 86.2 84.0 84.8  PLT 617*   < > 539* 520* 460* 490* 447*   < > = values in this interval not displayed.    Basic Metabolic Panel:  Recent Labs  Lab 11/28/21 0208 11/29/21 0352 11/30/21 0459 12/01/21 0631 12/02/21 0248 12/03/21 0543 12/04/21 0852  NA 142   < > 139 139 139 135 137  K 3.5   < > 3.9 4.0 4.9 4.5 4.4  CL 113*   < > 112* 112* 114* 110 112*  CO2 20*   < > 21* 20* 18* 18* 17*  GLUCOSE 157*   < > 102* 114* 149* 158* 97  BUN 67*   < > 59* 59* 68* 77* 71*  CREATININE 2.71*   < > 2.50* 2.48* 2.68* 2.86* 2.57*  CALCIUM 9.1   < > 9.1 9.4 8.6* 8.6* 8.6*  MG 2.1  --   --   --  2.3  --   --   PHOS  --   --   --  3.5  --   --   --    < > = values in this interval not displayed.    GFR: Estimated Creatinine Clearance: 16.5 mL/min (A) (by C-G formula based on SCr of 2.57 mg/dL (H)).  Liver Function Tests: Recent Labs  Lab 11/28/21 0208 11/30/21 0459 12/01/21 0631 12/02/21 0248  AST 51* 51*  --  34  ALT 48* 52*  --  36  ALKPHOS 125 113  --  88  BILITOT 0.3 0.4  --  0.7  PROT 5.7* 5.8*   --  5.3*  ALBUMIN 1.8* 1.8* 1.9* 1.8*    CBG: Recent Labs  Lab 11/29/21 0848  GLUCAP 106*     Recent Results (from the past 240 hour(s))  SARS Coronavirus 2 by RT PCR (hospital order, performed in [facility] hospital lab) *cepheid single result test* Urine, Clean Catch     Status: None   Collection Time: 11/25/21  4:42 PM   Specimen: Urine, Clean Catch; Nasal Swab  Result Value Ref Range Status   SARS Coronavirus 2 by RT PCR NEGATIVE NEGATIVE Final    Comment: (NOTE) SARS-CoV-2 target nucleic acids are NOT DETECTED.  The SARS-CoV-2 RNA is generally detectable in upper and lower respiratory specimens during the acute phase of infection. The lowest concentration of SARS-CoV-2 viral copies this assay can detect is 250 copies / mL. A negative result does not preclude SARS-CoV-2 infection and should not be used as the sole basis for treatment or other patient management decisions.  A negative result may occur with improper specimen collection / handling, submission of specimen other than nasopharyngeal swab, presence of viral mutation(s) within the areas targeted by this assay, and inadequate number of viral copies (<250 copies / mL). A negative result must be combined with clinical observations, patient history, and epidemiological information.  Fact Sheet for Patients:   https://www.patel.info/  Fact Sheet for Healthcare Providers: https://hall.com/  This test is not yet approved or  cleared by the [place] FDA and has been authorized for detection and/or diagnosis of SARS-CoV-2 by FDA under an Emergency Use Authorization (EUA).  This EUA will remain in effect (meaning this test can be used) for the duration of the COVID-19 declaration under Section 564(b)(1) of the Act, 21 U.S.C. section 360bbb-3(b)(1), unless the authorization is terminated or revoked sooner.  Performed at [facility], [place] [address],  [place], [place] [address]   Surgical PCR screen     Status: None   Collection Time: 11/26/21  7:12 AM   Specimen: Nasal Mucosa; Nasal Swab  Result Value Ref Range Status   MRSA, PCR NEGATIVE NEGATIVE Final  Staphylococcus aureus NEGATIVE NEGATIVE Final    Comment: (NOTE) The Xpert SA Assay (FDA approved for NASAL specimens in patients 67 years of age and older), is one component of a comprehensive surveillance program. It is not intended to diagnose infection nor to guide or monitor treatment. Performed at [facility], [place] [address] [place], [place] [address]   Aerobic/Anaerobic Culture w Gram Stain (surgical/deep wound)     Status: None (Preliminary result)   Collection Time: 12/01/21  5:08 PM   Specimen: PATH Other; Tissue  Result Value Ref Range Status   Specimen Description FLUID  Final   Special Requests RIGHT HIP FLUID  Final   Gram Stain   Final    FEW WBC PRESENT,BOTH PMN AND MONONUCLEAR NO ORGANISMS SEEN Performed at [facility], [address], [place], [place] [address]    Culture   Final    RARE STREPTOCOCCUS SALIVARIUS NO ANAEROBES ISOLATED; CULTURE IN PROGRESS FOR 5 DAYS    Report Status PENDING  Incomplete   Organism ID, Bacteria STREPTOCOCCUS SALIVARIUS  Final      Susceptibility   Streptococcus salivarius - MIC*    PENICILLIN <=0.06 SENSITIVE Sensitive     CEFTRIAXONE <=0.12 SENSITIVE Sensitive     ERYTHROMYCIN <=0.12 SENSITIVE Sensitive     LEVOFLOXACIN 2 SENSITIVE Sensitive     VANCOMYCIN 0.5 SENSITIVE Sensitive     * RARE STREPTOCOCCUS SALIVARIUS         Radiology Studies: [place] EKG SITE RITE  Result Date: 12/03/2021 If Site Rite image not attached, placement could not be confirmed due to current cardiac rhythm.       Scheduled Meds:  allopurinol  100 mg Oral q morning   aspirin  81 mg Oral BID   carvedilol  3.125 mg Oral BID WC   Chlorhexidine Gluconate Cloth  6 each Topical Daily   docusate sodium  100 mg Oral BID    dorzolamide  1 drop Both Eyes BID   epoetin alfa-epbx (RETACRIT) injection  20,000 Units Subcutaneous Once   feeding supplement  237 mL Oral TID BM   ferrous sulfate  325 mg Oral TID PC   latanoprost  1 drop Both Eyes QHS   multivitamin  1 tablet Oral QHS   pantoprazole  40 mg Oral Q0600   senna-docusate  1 tablet Oral BID   sodium chloride flush  10-40 mL Intracatheter Q12H   Continuous Infusions:  sodium chloride Stopped (12/03/21 1731)   sodium chloride     cefTRIAXone (ROCEPHIN)  IV 2 g (12/04/21 0840)   methocarbamol (ROBAXIN) IV         LOS: 9 days    Time spent: 35 minutes    Geradine Girt, DO [facility]   To contact the attending provider between 7A-7P or the covering provider during after hours 7P-7A, please log into the web site [email or web address] and access using universal [place] password for that web site. If you do not have the password, please call the hospital operator.  12/04/2021, 12:23 PM

## 2021-12-04 NOTE — TOC Progression Note (Addendum)
Transition of Care [facility]) - Progression Note    Patient Details  Name: Danny Rhodes MRN: [id number] Date of Birth: [birth year]  Transition of [facility]) CM/SW Contact  Sharin Mons, RN Phone Number: 12/04/2021, 11:24 AM  Clinical Narrative:    Per MD pt will be d/c ready on tomorrow with LT IV ABX therapy to follow. Pt will transition to home with family ( wife and daughter, Kendrick Fries).  Home infusion teaching completed per Pam with [facility] with daughters.  Referral made with [employer] for DME.Equipment will be delivered to pt's home [facility] and hospital bed on tomorrow. W/C will be delivered to bedside prior to d/c.  TOC team will continue to monitor and assist with needs....  Expected Discharge Plan: [place] Barriers to Discharge: Continued Medical Work up  Expected Discharge Plan and Services Expected Discharge Plan: [place] In-house Referral: Clinical Social Work Discharge Planning Services: CM Consult Post Acute Care Choice:  (TBD) Living arrangements for the past 2 months: [place]                 DME Arranged: 3-N-1, Hospital bed, Wheelchair manual (IV ABX [facility] Jeannene Patella)) DME Agency: [employer] Date DME Agency Contacted: 12/04/21 Time DME Agency Contacted: [phone] Representative spoke with at DME Agency: [place]: RN Pine Rhodes:  Merchant [employer] officer) Date [place]: 12/04/21 Time [place]: 78 Representative spoke with at [place]: [place] Determinants of Health ([place]) Interventions    Readmission Risk Interventions     No data to display

## 2021-12-05 DIAGNOSIS — M009 Pyogenic arthritis, unspecified: Secondary | ICD-10-CM | POA: Diagnosis not present

## 2021-12-05 DIAGNOSIS — T8452XA Infection and inflammatory reaction due to internal left hip prosthesis, initial encounter: Secondary | ICD-10-CM | POA: Diagnosis not present

## 2021-12-05 DIAGNOSIS — T84020A Dislocation of internal right hip prosthesis, initial encounter: Secondary | ICD-10-CM | POA: Diagnosis not present

## 2021-12-05 MED ORDER — OXYCODONE HCL 5 MG PO TABS: 5.0000 mg | ORAL_TABLET | ORAL | 0 refills | Status: DC | PRN

## 2021-12-05 MED ORDER — PANTOPRAZOLE SODIUM 40 MG PO TBEC: 40.0000 mg | DELAYED_RELEASE_TABLET | Freq: Every day | ORAL | 0 refills | Status: DC

## 2021-12-05 MED ORDER — METHOCARBAMOL 500 MG PO TABS: 500.0000 mg | ORAL_TABLET | Freq: Four times a day (QID) | ORAL | 0 refills | Status: DC | PRN

## 2021-12-05 MED ORDER — CEFTRIAXONE IV (FOR PTA / DISCHARGE USE ONLY): 2.0000 g | INTRAVENOUS | 0 refills | Status: AC

## 2021-12-05 MED ORDER — ASPIRIN 81 MG PO CHEW: 81.0000 mg | CHEWABLE_TABLET | Freq: Two times a day (BID) | ORAL | Status: DC

## 2021-12-05 NOTE — Discharge Summary (Signed)
Physician Discharge Summary  Danny Rhodes UXL:[id number] DOB: August 19, 1926 DOA: 11/25/2021  PCP: [name], MD  Admit date: 11/25/2021 Discharge date: 12/05/2021  Admitted From: home Discharge disposition: home   Recommendations for Outpatient Follow-Up:   IV abx for 6 weeks Home health Cbc, bmp 1 week   Discharge Diagnosis:   Principal Problem:   Dislocation of internal right hip prosthesis, initial encounter [facility]) Active Problems:   Acute renal failure superimposed on stage 4 chronic kidney disease (HCC)   FTT (failure to thrive) in adult   Dysphagia   Leukocytosis   Transaminitis   Essential hypertension   History of anemia due to chronic kidney disease   Gout   Failure to thrive in adult   Protein-calorie malnutrition, severe   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  Diet recommendation: DYS diet  Wound care: None.  Code status: Full.   History of Present Illness:   Danny Rhodes is a 88 y.o. male with medical history significant of CKD stage 4, ACD, HTN, HLD who presented to ED with complaints of right hip pain and FTT over the past few weeks.    He started to have pain in his right hip about 5 years ago. He saw his orthopedic, Dr. Alvan Dame, who stated pin in hip was loose, but did not recommend surgery. He started to use a cane about 1.5 years ago and then a walker about one year ago. On June 8th he woke up in severe pain with chills and not feeling well with no appetite. He states pain was in right hip, no radiation. Rated as a 9/10 and desribed as sharp. He denies any falls or trauma. Tylenol didn't help the pain. Tramadol eased it, but didn't take the pain away. He has not really ate or drank much over the past 2 weeks and continued to have the pain in his right hip. Family decided he was so weak and the pain was so bad they brought him to the ED.    He also has complaints of  dysphagia of solid foods on 6/8 as well. No issues with liquids, grapefruit or oranges, but states other food just won't go down. Feels like it gets stuck. He has no problems with pills. NO coughing or choking post swallowing.    He has not had any NSAIDs. Denies any difficulty urinating or change in color of urine.    Denies any fever/chills, vision changes/headaches, chest pain or palpitations, shortness of breath or cough, abdominal pain, N/V/D, dysuria or leg swelling from baseline. He has lost weight over the past few weeks.       Hospital Course by Problem:   Dislocation of internal right hip prosthesis, initial encounter [facility]) 88 year old male with history of right hip prosthetic with worsening pain over the past 5 years with increased pain over the past 2 weeks found to have dislocation of his prosthesis and has likely outlived it -ortho consulted and patient seen by Dr. Alvan Dame who reviewed films, assessed patient, recommending CT scan for further evaluation.   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  Bone extensively destroyed and prostheses are loose and there are large associated soft tissue masses (pseudotumor), right femoral prosthesis dislocated, one of the pseudotumors projects into the pelvis and has significant mass effect on the rectum and bladder.  Also contains gas and  could be secondary infected.  Very large right inguinal hernia containing bowel. -Orthopedics at this point recommending surgical intervention for pain management/palliation and recommending touchdown weightbearing on right lower extremity as tolerated with PT pending CT review per orthopedics- surgery delayed -procedure 12/01/2021. -Continue current pain management with Norco and Dilaudid as needed with CKD. -Continue scheduled Tylenol. -SCDs. -PT/OT. -ID consult: ceftriaxone 2 grams IV every 24 hours Picc line Plan for 6 weeks antibiotics -TOC  consulted for d/c planning   Acute renal failure superimposed on stage 4 chronic kidney disease ([place]) Likely due to prerenal azotemia secondary to FTT with poor PO intake over the past 2 weeks, no hx of exogenous losses or urinary complaints Strict I/O Urinalysis unremarkable. Bladder scan with no urine  -Baseline creatinine approximately 2.3-2.5. Hold/avoid nephrotoxic drugs. Palliative care consulted and following. -give outpatient epogen dose   FTT (failure to thrive) in adult History of weight loss, poor PO intake and overall FTT Nutrition consult, palliative care consult, SLP eval and patient currently on a dysphagia 2 diet.   Dysphagia New complaint since June 8th. Can not differentiate if oral vs. Pharyngeal/esophageal Good laryngeal elevation with no s/s of aspiration Can swallow liquids fine and grapefruit -States he feels the food balls up in his mouth and just does not go down. -SLP has evaluated the patient and patient to be placed on a dysphagia 2 diet.   -Per SLP patient sometimes feels food may get regurgitated.   -Barium esophagram done with mild intermittent esophageal dysmotility with tertiary contractions, small hiatal hernia, small volume GERD observed at the level of lower esophagus otherwise unremarkable esophagram.  -Patient tolerating current diet.  -Continue dysphagia 2 diet. -Continue PPI.    Leukocytosis Due to hip infection -follow outpatient    Transaminitis Mildly elevated -outpatient follow up   Essential hypertension Controlled on current regimen Norvasc held   History of anemia due to chronic kidney disease Baseline around 10, -given outpatient eopgen x 1   Gout - Stable.   -No acute flare.   -Allopurinol.    Protein-calorie malnutrition, severe - Patient currently tolerating a dysphagia 2 diet.  -Ensure supplementation. -Follow.       Medical Consultants:   ID Ortho Palliative care   Discharge Exam:   Vitals:    12/04/21 2019 12/05/21 0827  BP: (!) 150/64 (!) 139/56  Pulse: 88 65  Resp: 15   Temp: 98.4 F (36.9 C) 98.9 F (37.2 C)  SpO2: 100% 100%   Vitals:   12/04/21 0811 12/04/21 1559 12/04/21 2019 12/05/21 0827  BP: (!) 140/48 (!) 142/63 (!) 150/64 (!) 139/56  Pulse: 68 75 88 65  Resp:   15   Temp: 98.4 F (36.9 C) 97.6 F (36.4 C) 98.4 F (36.9 C) 98.9 F (37.2 C)  TempSrc: Oral Oral Oral Oral  SpO2: 100% 100% 100% 100%  Weight:      Height:        General exam: Appears calm and comfortable.   The results of significant diagnostics from this hospitalization (including imaging, microbiology, ancillary and laboratory) are listed below for reference.     Procedures and Diagnostic Studies:   DG Chest Port 1 View  Result Date: 11/25/2021 CLINICAL DATA:  Preop EXAM: PORTABLE CHEST 1 VIEW COMPARISON:  Chest x-ray dated December 25, 2018 FINDINGS: The heart size and mediastinal contours are within normal limits. Calcifications of the aortic arch. Both lungs are clear. The visualized skeletal structures are unremarkable. IMPRESSION: No active disease. Electronically Signed  By: Yetta Glassman M.D.   On: 11/25/2021 13:25   DG Hip Unilat W or Wo Pelvis 2-3 Views Right  Result Date: 11/25/2021 CLINICAL DATA:  Concern for dislocation. EXAM: DG HIP (WITH OR WITHOUT PELVIS) 3V RIGHT COMPARISON:  None. FINDINGS: Prior right total hip replacement with superolateral dislocation of the femoral hardware component. Lucency in the greater trochanter adjacent to the proximal lateral femoral stem. Cerclage wires surrounding the femoral hardware stem. Acetabular cup position appears medially rotated, although evaluation is limited due to lack of prior imaging. No evidence of fracture. Bowel gas extends below the pelvis, likely due to large abdominal wall hernia. IMPRESSION: 1. Prior right total hip replacement with superolateral dislocation of the femoral hardware component 2. Periimplant lucency involving  the in the greater trochanter, finding could be due to age-indeterminate hardware loosening. 3. Acetabular cup position appears medially rotated, although evaluation is limited due to lack of prior imaging. Electronically Signed   By: Yetta Glassman M.D.   On: 11/25/2021 13:17     Labs:   Basic Metabolic Panel: Recent Labs  Lab 11/30/21 0459 12/01/21 0631 12/02/21 0248 12/03/21 0543 12/04/21 0852  NA 139 139 139 135 137  K 3.9 4.0 4.9 4.5 4.4  CL 112* 112* 114* 110 112*  CO2 21* 20* 18* 18* 17*  GLUCOSE 102* 114* 149* 158* 97  BUN 59* 59* 68* 77* 71*  CREATININE 2.50* 2.48* 2.68* 2.86* 2.57*  CALCIUM 9.1 9.4 8.6* 8.6* 8.6*  MG  --   --  2.3  --   --   PHOS  --  3.5  --   --   --    GFR Estimated Creatinine Clearance: 16.5 mL/min (A) (by C-G formula based on SCr of 2.57 mg/dL (H)). Liver Function Tests: Recent Labs  Lab 11/30/21 0459 12/01/21 0631 12/02/21 0248  AST 51*  --  34  ALT 52*  --  36  ALKPHOS 113  --  88  BILITOT 0.4  --  0.7  PROT 5.8*  --  5.3*  ALBUMIN 1.8* 1.9* 1.8*   No results for input(s): "LIPASE", "AMYLASE" in the last 168 hours. No results for input(s): "AMMONIA" in the last 168 hours. Coagulation profile No results for input(s): "INR", "PROTIME" in the last 168 hours.  CBC: Recent Labs  Lab 11/30/21 0459 12/01/21 0631 12/02/21 0248 12/03/21 0543 12/04/21 0852  WBC 14.2* 13.4* 32.8* 20.0* 16.0*  NEUTROABS 11.8*  --  30.4*  --   --   HGB 9.3* 9.6* 11.2* 8.8* 7.8*  HCT 29.5* 31.5* 34.9* 27.3* 24.6*  MCV 84.5 85.6 86.2 84.0 84.8  PLT 539* 520* 460* 490* 447*   Cardiac Enzymes: Recent Labs  Lab 12/03/21 0543  CKTOTAL 39*   BNP: Invalid input(s): "POCBNP" CBG: Recent Labs  Lab 11/29/21 0848  GLUCAP 106*   D-Dimer No results for input(s): "DDIMER" in the last 72 hours. Hgb A1c No results for input(s): "HGBA1C" in the last 72 hours. Lipid Profile No results for input(s): "CHOL", "HDL", "LDLCALC", "TRIG", "CHOLHDL",  "LDLDIRECT" in the last 72 hours. Thyroid function studies No results for input(s): "TSH", "T4TOTAL", "T3FREE", "THYROIDAB" in the last 72 hours.  Invalid input(s): "FREET3" Anemia work up No results for input(s): "VITAMINB12", "FOLATE", "FERRITIN", "TIBC", "IRON", "RETICCTPCT" in the last 72 hours. Microbiology Recent Results (from the past 240 hour(s))  SARS Coronavirus 2 by RT PCR (hospital order, performed in [facility] hospital lab) *cepheid single result test* Urine, Clean Catch  Status: None   Collection Time: 11/25/21  4:42 PM   Specimen: Urine, Clean Catch; Nasal Swab  Result Value Ref Range Status   SARS Coronavirus 2 by RT PCR NEGATIVE NEGATIVE Final    Comment: (NOTE) SARS-CoV-2 target nucleic acids are NOT DETECTED.  The SARS-CoV-2 RNA is generally detectable in upper and lower respiratory specimens during the acute phase of infection. The lowest concentration of SARS-CoV-2 viral copies this assay can detect is 250 copies / mL. A negative result does not preclude SARS-CoV-2 infection and should not be used as the sole basis for treatment or other patient management decisions.  A negative result may occur with improper specimen collection / handling, submission of specimen other than nasopharyngeal swab, presence of viral mutation(s) within the areas targeted by this assay, and inadequate number of viral copies (<250 copies / mL). A negative result must be combined with clinical observations, patient history, and epidemiological information.  Fact Sheet for Patients:   https://www.patel.info/  Fact Sheet for Healthcare Providers: https://hall.com/  This test is not yet approved or  cleared by the [place] FDA and has been authorized for detection and/or diagnosis of SARS-CoV-2 by FDA under an Emergency Use Authorization (EUA).  This EUA will remain in effect (meaning this test can be used) for the duration of  the COVID-19 declaration under Section 564(b)(1) of the Act, 21 U.S.C. section 360bbb-3(b)(1), unless the authorization is terminated or revoked sooner.  Performed at [facility], [place] [address] [place], [place] [address]   Surgical PCR screen     Status: None   Collection Time: 11/26/21  7:12 AM   Specimen: Nasal Mucosa; Nasal Swab  Result Value Ref Range Status   MRSA, PCR NEGATIVE NEGATIVE Final   Staphylococcus aureus NEGATIVE NEGATIVE Final    Comment: (NOTE) The Xpert SA Assay (FDA approved for NASAL specimens in patients 63 years of age and older), is one component of a comprehensive surveillance program. It is not intended to diagnose infection nor to guide or monitor treatment. Performed at [facility], [place] [address], [place], [place] [address]   Aerobic/Anaerobic Culture w Gram Stain (surgical/deep wound)     Status: None (Preliminary result)   Collection Time: 12/01/21  5:08 PM   Specimen: PATH Other; Tissue  Result Value Ref Range Status   Specimen Description FLUID  Final   Special Requests RIGHT HIP FLUID  Final   Gram Stain   Final    FEW WBC PRESENT,BOTH PMN AND MONONUCLEAR NO ORGANISMS SEEN Performed at [facility], [address] [place], [place] [address]    Culture   Final    RARE STREPTOCOCCUS SALIVARIUS NO ANAEROBES ISOLATED; CULTURE IN PROGRESS FOR 5 DAYS    Report Status PENDING  Incomplete   Organism ID, Bacteria STREPTOCOCCUS SALIVARIUS  Final      Susceptibility   Streptococcus salivarius - MIC*    PENICILLIN <=0.06 SENSITIVE Sensitive     CEFTRIAXONE <=0.12 SENSITIVE Sensitive     ERYTHROMYCIN <=0.12 SENSITIVE Sensitive     LEVOFLOXACIN 2 SENSITIVE Sensitive     VANCOMYCIN 0.5 SENSITIVE Sensitive     * RARE STREPTOCOCCUS SALIVARIUS     Discharge Instructions:   Discharge Instructions     [facility] pharmacist to adjust dose for Vancomycin, Aminoglycosides and other anti-infective therapies as  requested by physician.   Complete by: As directed    [facility] to provide Cath Flo 77m   Complete by:  As directed    Administer for PICC line occlusion and as ordered by physician for other access device issues.   Anaphylaxis Kit: Provided to treat any anaphylactic reaction to the medication being provided to the patient if First Dose or when requested by physician   Complete by: As directed    Epinephrine 37m/ml vial / amp: Administer 0.354m(0.42m64msubcutaneously once for moderate to severe anaphylaxis, nurse to call physician and pharmacy when reaction occurs and call 911 if needed for immediate care   Diphenhydramine 94m66m IV vial: Administer 25-94mg442mIM PRN for first dose reaction, rash, itching, mild reaction, nurse to call physician and pharmacy when reaction occurs   Sodium Chloride 0.9% NS 500ml 41mAdminister if needed for hypovolemic blood pressure drop or as ordered by physician after call to physician with anaphylactic reaction   Change dressing on IV access line weekly and PRN   Complete by: As directed    Diet general   Complete by: As directed    DYS 2   Flush IV access with Sodium Chloride 0.9% and Heparin 10 units/ml or 100 units/ml   Complete by: As directed    Home infusion instructions - [facility]   Complete by: As directed    Instructions: Flush IV access with Sodium Chloride 0.9% and Heparin 10units/ml or 100units/ml   Change dressing on IV access line: Weekly and PRN   Instructions Cath Flo 2mg: A66mnister for PICC Line occlusion and as ordered by physician for other access device   [facility] pharmacist to adjust dose for: Vancomycin, Aminoglycosides and other anti-infective therapies as requested by physician   Increase activity slowly   Complete by: As directed    Method of administration may be changed at the discretion of home infusion pharmacist based upon assessment of the patient and/or caregiver's ability to  self-administer the medication ordered   Complete by: As directed    No wound care   Complete by: As directed       Allergies as of 12/05/2021   No Known Allergies      Medication List     STOP taking these medications    amLODipine 10 MG tablet Commonly known as: NORVASC   furosemide 40 MG tablet Commonly known as: LASIX   traMADol 50 MG tablet Commonly known as: ULTRAM       TAKE these medications    acetaminophen 500 MG tablet Commonly known as: TYLENOL Take 1,000 mg by mouth 2 (two) times daily as needed for headache (pain).   allopurinol 100 MG tablet Commonly known as: ZYLOPRIM Take 100 mg by mouth every morning.   aspirin 81 MG chewable tablet Chew 1 tablet (81 mg total) by mouth 2 (two) times daily.   atorvastatin 20 MG tablet Commonly known as: LIPITOR Take 20 mg by mouth every morning.   carvedilol 3.125 MG tablet Commonly known as: COREG Take 3.125 mg by mouth 2 (two) times daily with a meal.   cefTRIAXone  IVPB Commonly known as: ROCEPHIN Inject 2 g into the vein daily. Indication:  Strep R-hip PJI First Dose: Yes Last Day of Therapy:  01/12/22 Labs - Once weekly:  CBC/D and BMP, Labs - Every other week:  ESR and CRP Method of administration: IV Push Method of administration may be changed at the discretion of home infusion pharmacist based upon assessment of the patient and/or caregiver's ability to self-administer the medication ordered.   dorzolamide 2 % ophthalmic solution Commonly known as: TRUSOPT  Place 1 drop into both eyes 2 (two) times daily.   latanoprost 0.005 % ophthalmic solution Commonly known as: XALATAN Place 1 drop into both eyes at bedtime.   methocarbamol 500 MG tablet Commonly known as: ROBAXIN Take 1 tablet (500 mg total) by mouth every 6 (six) hours as needed for muscle spasms.   oxyCODONE 5 MG immediate release tablet Commonly known as: Oxy IR/ROXICODONE Take 1 tablet (5 mg total) by mouth every 4 (four) hours as  needed for moderate pain.   pantoprazole 40 MG tablet Commonly known as: PROTONIX Take 1 tablet (40 mg total) by mouth daily at 6 (six) AM. Start taking on: December 06, 2021               Durable Medical Equipment  (From admission, onward)           Start     Ordered   12/04/21 1044  For home use only DME 3 n 1  Once        12/04/21 1043   12/04/21 1042  For home use only DME lightweight manual wheelchair with seat cushion  Once       Comments: Patient suffers from revision of R hip prothesis which impairs their ability to perform daily activities like dressing in the home.  A walker will not resolve  issue with performing activities of daily living. A wheelchair will allow patient to safely perform daily activities. Patient is not able to propel themselves in the home using a standard weight wheelchair due to general weakness. Patient can self propel in the lightweight wheelchair. Length of need 6 months . Accessories: elevating leg rests (ELRs), wheel locks, extensions and anti-tippers.   12/04/21 1043   12/04/21 1039  For home use only DME Hospital bed  Once       Question Answer Comment  Length of Need 6 Months   Patient has (list medical condition): glaucoma, HTN, gout, anemia due to CKD, and  failed right hip prosthesis/ s/p revision 12/01/2021   The above medical condition requires: Patient requires the ability to reposition frequently   Bed type Semi-electric   Support Surface: Gel Overlay      12/04/21 1043              Discharge Care Instructions  (From admission, onward)           Start     Ordered   12/05/21 0000  Change dressing on IV access line weekly and PRN  (Home infusion instructions - [facility] )        12/05/21 0905            Follow-up Information     Paralee Cancel, MD. Schedule an appointment as soon as possible for a visit in 2 week(s).   Specialty: Orthopedic Surgery Contact information: [address] [place]  [place] [address] [phone]         [name], MD Follow up in 1 week(s).   Specialty: Internal Medicine Contact information: [address] [place] [address] [phone]                  Time coordinating discharge: 45 min  Signed:  Geradine Girt DO  [facility] 12/05/2021, 9:09 AM

## 2021-12-05 NOTE — TOC Transition Note (Signed)
Transition of Care [facility]) - CM/SW Discharge Note   Patient Details  Name: Danny Rhodes MRN: [id number] Date of Birth: 1926/07/19  [facility] CM/SW Contact:  Bartholomew Crews, RN Phone Number: [phone] 12/05/2021, 2:54 PM   Clinical Narrative:     Patient to transition home today. Confirmed delivery of DME with [employer]. Confirmed with Pam at [facility] that IV antibiotics have been delivered to the home. [facility] has been arranged for nursing for long term home infusion. Spoke with Danny Rhodes, patient's daughter. They are ready for patient to come home. Discussed [facility] for long term home infusion with first visit on Monday 7/3. Discussed additional recommendations for PT/OT - [facility] ([facility]) to follow up for PT/OT needs with start of care Tuesday 12/08/2021. Family to provide transportation home.   Final next level of care: [place] Barriers to Discharge: No Barriers Identified   Patient Goals and CMS Choice Patient states their goals for this hospitalization and ongoing recovery are:: walking CMS Medicare.gov Compare Post Acute Care list provided to:: Patient Choice offered to / list presented to : Patient, Adult Children  Discharge Placement                       Discharge Plan and Services In-house Referral: Clinical Social Work Discharge Planning Services: CM Consult Post Acute Care Choice:  (TBD)          DME Arranged: 3-N-1, Hospital bed, Wheelchair manual (IV ABX THERAPY/ [facility])) DME Agency: [facility] Date DME Agency Contacted: 12/04/21 Time DME Agency Contacted: [id number] Representative spoke with at DME Agency: [place]: PT, OT, RN [place] Agency: [place] Date [place]: 12/05/21 Time [place]: [phone] Representative spoke with at [place]: Marisue [place] with [facility]) for PT, OT and Carolynn Sayers with [employer] for RN for home  infusion  Social Determinants of Health (SDOH) Interventions     Readmission Risk Interventions     No data to display

## 2021-12-05 NOTE — Progress Notes (Signed)
Physical Therapy Treatment Patient Details Name: Danny Rhodes MRN: [id number] DOB: 25-Dec-1926 Today's Date: 12/05/2021   History of Present Illness 88 y.o. male  with past medical history of glaucoma, HTN, gout, anemia due to CKD, and right hip prosthesis admitted on 11/25/2021 with right hip pain.  Patient has been diagnosed with dislocation of anterior right hip prosthesis. s/p Resection right total hip arthroplasty, Girdlestone procedure, and extensive excisional debridement    PT Comments    Pt in bed and agreeable to participate with therapy, however pt requesting to remain in bed as he is d/cing this AM and wants to save his energy. Pt performed bed level ther ex. Noted pt's hips externally rotated and pt stated he had not worn his prevalon boots in a few days. Boots donned at end of session to facilitate better hip positioning. Current plan remains appropriate.    Recommendations for follow up therapy are one component of a multi-disciplinary discharge planning process, led by the attending physician.  Recommendations may be updated based on patient status, additional functional criteria and insurance authorization.  Follow Up Recommendations  Home health PT     Assistance Recommended at Discharge Frequent or constant Supervision/Assistance  Patient can return home with the following A lot of help with walking and/or transfers;A lot of help with bathing/dressing/bathroom;Assistance with cooking/housework;Assist for transportation;Help with stairs or ramp for entrance   Equipment Recommendations  Rolling walker (2 wheels);BSC/3in1;Wheelchair (measurements PT);Wheelchair cushion (measurements PT);Hospital bed;Other (comment)    Recommendations for Other Services OT consult     Precautions / Restrictions Precautions Precautions: Fall Precaution Comments: Transfers only Restrictions Weight Bearing Restrictions: Yes RLE Weight Bearing: Touchdown weight bearing Other  Position/Activity Restrictions: TDWB for transfers only     Mobility  Bed Mobility Overal bed mobility: Needs Assistance             General bed mobility comments: declined OOB today    Transfers                        Ambulation/Gait                   Stairs             Wheelchair Mobility    Modified Rankin (Stroke Patients Only)       Balance Overall balance assessment: Needs assistance Sitting-balance support: No upper extremity supported, Feet supported Sitting balance-[name]: Good     Standing balance support: Bilateral upper extremity supported, During functional activity Standing balance-[name]: Poor Standing balance comment: reliant on UE support to maintain WB precautions                            Cognition Arousal/Alertness: Awake/alert Behavior During Therapy: [name] for tasks assessed/performed Overall Cognitive Status: Within Functional Limits for tasks assessed                                 General Comments: Pleasant and paticipatory but requires increased time to complete tasks. Slow processing present.        Exercises Total Joint Exercises Towel Squeeze: AROM, Both, 5 reps, Supine General Exercises - Lower Extremity Ankle Circles/Pumps: AROM, Both, 10 reps, Supine Quad Sets: AROM, Strengthening, Right, 10 reps, Supine Heel Slides: AROM, AAROM, Both, 5 reps, Supine (AAROM on R. Increased pain) Hip ABduction/ADduction: AROM, AAROM, Both, 10 reps,  Supine (AAROM on R)    General Comments        Pertinent Vitals/Pain Pain Assessment Pain Assessment: 0-10 Pain Score: 5  Pain Location: R hip Pain Descriptors / Indicators: Grimacing Pain Intervention(s): Monitored during session, Limited activity within patient's tolerance, Repositioned, Patient requesting pain meds-RN notified    Home Living                          Prior Function            PT Goals (current  goals can now be found in the care plan section) Acute Rehab PT Goals Patient Stated Goal: Be able to get home PT Goal Formulation: With patient Time For Goal Achievement: 12/16/21 Potential to Achieve Goals: Good Progress towards PT goals: Progressing toward goals    Frequency    Min 5X/week      PT Plan Current plan remains appropriate    Co-evaluation              AM-PAC PT "6 Clicks" Mobility   Outcome Measure  Help needed turning from your back to your side while in a flat bed without using bedrails?: A Little Help needed moving from lying on your back to sitting on the side of a flat bed without using bedrails?: A Little Help needed moving to and from a bed to a chair (including a wheelchair)?: A Little Help needed standing up from a chair using your arms (e.g., wheelchair or bedside chair)?: A Lot Help needed to walk in hospital room?: Total Help needed climbing 3-5 steps with a railing? : Total 6 Click Score: 13    End of Session Equipment Utilized During Treatment: Gait belt Activity Tolerance: Patient tolerated treatment well Patient left: in bed;with nursing/sitter in room;with call bell/phone within reach Nurse Communication: Mobility status;Patient requests pain meds PT Visit Diagnosis: Unsteadiness on feet (R26.81);Other abnormalities of gait and mobility (R26.89);Pain Pain - Right/Left: Right Pain - part of body: Hip     Time: 8115-7262 PT Time Calculation (min) (ACUTE ONLY): 28 min  Charges:  $Therapeutic Exercise: 23-37 mins                     Benjiman Core, PTA Acute Rehab  Allena Katz 12/05/2021, 10:31 AM

## 2021-12-05 NOTE — Progress Notes (Signed)
   Palliative Medicine Inpatient Follow Up Note   Per chart review plan for patient to discharge home today on IV antibiotics.   No new Palliative needs identified.  No Charge  ______________________________________________________________________________________ [place] Team Team Cell Phone: [phone] Please utilize secure chat with additional questions, if there is no response within 30 minutes please call the above phone number  Palliative Medicine Team providers are available by phone from 7am to 7pm daily and can be reached through the team cell phone.  Should this patient require assistance outside of these hours, please call the patient's attending physician.

## 2021-12-05 NOTE — Plan of Care (Signed)
  Problem: Pain Managment: Goal: General experience of comfort will improve Outcome: Progressing   Problem: Safety: Goal: Ability to remain free from injury will improve Outcome: Progressing   Problem: Skin Integrity: Goal: Risk for impaired skin integrity will decrease Outcome: Progressing   Problem: Activity: Goal: Ability to avoid complications of mobility impairment will improve Outcome: Progressing

## 2021-12-06 LAB — AEROBIC/ANAEROBIC CULTURE W GRAM STAIN (SURGICAL/DEEP WOUND)

## 2021-12-09 ENCOUNTER — Ambulatory Visit (HOSPITAL_COMMUNITY)
Admission: RE | Admit: 2021-12-09 | Discharge: 2021-12-09 | Disposition: A | Discharge status: Home / Self Care | Payer: Medicare Other | Source: Ambulatory Visit | Attending: Nephrology | Admitting: Nephrology

## 2021-12-09 VITALS — BP 153/48

## 2021-12-09 DIAGNOSIS — Z862 Personal history of diseases of the blood and blood-forming organs and certain disorders involving the immune mechanism: Secondary | ICD-10-CM | POA: Insufficient documentation

## 2021-12-09 DIAGNOSIS — T84020D Dislocation of internal right hip prosthesis, subsequent encounter: Secondary | ICD-10-CM | POA: Diagnosis not present

## 2021-12-09 DIAGNOSIS — N179 Acute kidney failure, unspecified: Secondary | ICD-10-CM | POA: Diagnosis not present

## 2021-12-09 DIAGNOSIS — N189 Chronic kidney disease, unspecified: Secondary | ICD-10-CM | POA: Insufficient documentation

## 2021-12-09 DIAGNOSIS — M1030 Gout due to renal impairment, unspecified site: Secondary | ICD-10-CM | POA: Diagnosis not present

## 2021-12-09 DIAGNOSIS — N184 Chronic kidney disease, stage 4 (severe): Secondary | ICD-10-CM | POA: Diagnosis not present

## 2021-12-09 DIAGNOSIS — D631 Anemia in chronic kidney disease: Secondary | ICD-10-CM | POA: Diagnosis not present

## 2021-12-09 DIAGNOSIS — I129 Hypertensive chronic kidney disease with stage 1 through stage 4 chronic kidney disease, or unspecified chronic kidney disease: Secondary | ICD-10-CM | POA: Diagnosis not present

## 2021-12-09 LAB — POCT HEMOGLOBIN-HEMACUE: Hemoglobin: 8.2 g/dL — ABNORMAL LOW (ref 13.0–17.0)

## 2021-12-09 MED ORDER — EPOETIN ALFA-EPBX 40000 UNIT/ML IJ SOLN
INTRAMUSCULAR | Status: AC
  Administered 2021-12-09: 20000 [IU] via SUBCUTANEOUS
  Filled 2021-12-09: qty 1

## 2021-12-09 MED ORDER — EPOETIN ALFA-EPBX 10000 UNIT/ML IJ SOLN: 20000.0000 [IU] | INTRAMUSCULAR | Status: DC

## 2021-12-10 DIAGNOSIS — D638 Anemia in other chronic diseases classified elsewhere: Secondary | ICD-10-CM | POA: Diagnosis not present

## 2021-12-10 DIAGNOSIS — R627 Adult failure to thrive: Secondary | ICD-10-CM | POA: Diagnosis not present

## 2021-12-10 DIAGNOSIS — T8451XS Infection and inflammatory reaction due to internal right hip prosthesis, sequela: Secondary | ICD-10-CM | POA: Diagnosis not present

## 2021-12-10 DIAGNOSIS — I129 Hypertensive chronic kidney disease with stage 1 through stage 4 chronic kidney disease, or unspecified chronic kidney disease: Secondary | ICD-10-CM | POA: Diagnosis not present

## 2021-12-10 DIAGNOSIS — N184 Chronic kidney disease, stage 4 (severe): Secondary | ICD-10-CM | POA: Diagnosis not present

## 2021-12-10 DIAGNOSIS — Z09 Encounter for follow-up examination after completed treatment for conditions other than malignant neoplasm: Secondary | ICD-10-CM | POA: Diagnosis not present

## 2021-12-13 DIAGNOSIS — T8452XA Infection and inflammatory reaction due to internal left hip prosthesis, initial encounter: Secondary | ICD-10-CM | POA: Diagnosis not present

## 2021-12-13 DIAGNOSIS — M009 Pyogenic arthritis, unspecified: Secondary | ICD-10-CM | POA: Diagnosis not present

## 2021-12-14 DIAGNOSIS — T8452XA Infection and inflammatory reaction due to internal left hip prosthesis, initial encounter: Secondary | ICD-10-CM | POA: Diagnosis not present

## 2021-12-14 DIAGNOSIS — T8451XA Infection and inflammatory reaction due to internal right hip prosthesis, initial encounter: Secondary | ICD-10-CM | POA: Diagnosis not present

## 2021-12-14 DIAGNOSIS — Z89621 Acquired absence of right hip joint: Secondary | ICD-10-CM | POA: Diagnosis not present

## 2021-12-14 DIAGNOSIS — M009 Pyogenic arthritis, unspecified: Secondary | ICD-10-CM | POA: Diagnosis not present

## 2021-12-14 DIAGNOSIS — T84020A Dislocation of internal right hip prosthesis, initial encounter: Secondary | ICD-10-CM | POA: Diagnosis not present

## 2021-12-17 DIAGNOSIS — D631 Anemia in chronic kidney disease: Secondary | ICD-10-CM | POA: Diagnosis not present

## 2021-12-17 DIAGNOSIS — T84020D Dislocation of internal right hip prosthesis, subsequent encounter: Secondary | ICD-10-CM | POA: Diagnosis not present

## 2021-12-17 DIAGNOSIS — N179 Acute kidney failure, unspecified: Secondary | ICD-10-CM | POA: Diagnosis not present

## 2021-12-17 DIAGNOSIS — M1030 Gout due to renal impairment, unspecified site: Secondary | ICD-10-CM | POA: Diagnosis not present

## 2021-12-17 DIAGNOSIS — N184 Chronic kidney disease, stage 4 (severe): Secondary | ICD-10-CM | POA: Diagnosis not present

## 2021-12-17 DIAGNOSIS — I129 Hypertensive chronic kidney disease with stage 1 through stage 4 chronic kidney disease, or unspecified chronic kidney disease: Secondary | ICD-10-CM | POA: Diagnosis not present

## 2021-12-18 DIAGNOSIS — T84020D Dislocation of internal right hip prosthesis, subsequent encounter: Secondary | ICD-10-CM | POA: Diagnosis not present

## 2021-12-18 DIAGNOSIS — N179 Acute kidney failure, unspecified: Secondary | ICD-10-CM | POA: Diagnosis not present

## 2021-12-18 DIAGNOSIS — N184 Chronic kidney disease, stage 4 (severe): Secondary | ICD-10-CM | POA: Diagnosis not present

## 2021-12-18 DIAGNOSIS — M1030 Gout due to renal impairment, unspecified site: Secondary | ICD-10-CM | POA: Diagnosis not present

## 2021-12-18 DIAGNOSIS — I129 Hypertensive chronic kidney disease with stage 1 through stage 4 chronic kidney disease, or unspecified chronic kidney disease: Secondary | ICD-10-CM | POA: Diagnosis not present

## 2021-12-18 DIAGNOSIS — D631 Anemia in chronic kidney disease: Secondary | ICD-10-CM | POA: Diagnosis not present

## 2021-12-20 DIAGNOSIS — T8452XA Infection and inflammatory reaction due to internal left hip prosthesis, initial encounter: Secondary | ICD-10-CM | POA: Diagnosis not present

## 2021-12-20 DIAGNOSIS — M009 Pyogenic arthritis, unspecified: Secondary | ICD-10-CM | POA: Diagnosis not present

## 2021-12-22 DIAGNOSIS — M009 Pyogenic arthritis, unspecified: Secondary | ICD-10-CM | POA: Diagnosis not present

## 2021-12-22 DIAGNOSIS — I129 Hypertensive chronic kidney disease with stage 1 through stage 4 chronic kidney disease, or unspecified chronic kidney disease: Secondary | ICD-10-CM | POA: Diagnosis not present

## 2021-12-22 DIAGNOSIS — T8452XA Infection and inflammatory reaction due to internal left hip prosthesis, initial encounter: Secondary | ICD-10-CM | POA: Diagnosis not present

## 2021-12-22 DIAGNOSIS — N184 Chronic kidney disease, stage 4 (severe): Secondary | ICD-10-CM | POA: Diagnosis not present

## 2021-12-22 DIAGNOSIS — M1030 Gout due to renal impairment, unspecified site: Secondary | ICD-10-CM | POA: Diagnosis not present

## 2021-12-22 DIAGNOSIS — T84020D Dislocation of internal right hip prosthesis, subsequent encounter: Secondary | ICD-10-CM | POA: Diagnosis not present

## 2021-12-22 DIAGNOSIS — N179 Acute kidney failure, unspecified: Secondary | ICD-10-CM | POA: Diagnosis not present

## 2021-12-22 DIAGNOSIS — D631 Anemia in chronic kidney disease: Secondary | ICD-10-CM | POA: Diagnosis not present

## 2021-12-22 DIAGNOSIS — T8451XA Infection and inflammatory reaction due to internal right hip prosthesis, initial encounter: Secondary | ICD-10-CM | POA: Diagnosis not present

## 2021-12-23 DIAGNOSIS — D631 Anemia in chronic kidney disease: Secondary | ICD-10-CM | POA: Diagnosis not present

## 2021-12-23 DIAGNOSIS — T84020D Dislocation of internal right hip prosthesis, subsequent encounter: Secondary | ICD-10-CM | POA: Diagnosis not present

## 2021-12-23 DIAGNOSIS — N179 Acute kidney failure, unspecified: Secondary | ICD-10-CM | POA: Diagnosis not present

## 2021-12-23 DIAGNOSIS — N184 Chronic kidney disease, stage 4 (severe): Secondary | ICD-10-CM | POA: Diagnosis not present

## 2021-12-23 DIAGNOSIS — M1030 Gout due to renal impairment, unspecified site: Secondary | ICD-10-CM | POA: Diagnosis not present

## 2021-12-23 DIAGNOSIS — I129 Hypertensive chronic kidney disease with stage 1 through stage 4 chronic kidney disease, or unspecified chronic kidney disease: Secondary | ICD-10-CM | POA: Diagnosis not present

## 2021-12-27 DIAGNOSIS — T8452XA Infection and inflammatory reaction due to internal left hip prosthesis, initial encounter: Secondary | ICD-10-CM | POA: Diagnosis not present

## 2021-12-27 DIAGNOSIS — M009 Pyogenic arthritis, unspecified: Secondary | ICD-10-CM | POA: Diagnosis not present

## 2021-12-28 ENCOUNTER — Other Ambulatory Visit: Payer: Self-pay

## 2021-12-28 ENCOUNTER — Ambulatory Visit (INDEPENDENT_AMBULATORY_CARE_PROVIDER_SITE_OTHER): Payer: Medicare Other | Admitting: Internal Medicine

## 2021-12-28 ENCOUNTER — Telehealth: Payer: Self-pay

## 2021-12-28 ENCOUNTER — Encounter: Payer: Self-pay | Admitting: Internal Medicine

## 2021-12-28 VITALS — BP 195/65 | HR 71 | Temp 97.7°F

## 2021-12-28 DIAGNOSIS — T8451XS Infection and inflammatory reaction due to internal right hip prosthesis, sequela: Secondary | ICD-10-CM

## 2021-12-28 DIAGNOSIS — T8450XS Infection and inflammatory reaction due to unspecified internal joint prosthesis, sequela: Secondary | ICD-10-CM

## 2021-12-28 NOTE — Telephone Encounter (Signed)
Per Dr. Candiss Norse reached out to [facility] team to extend Iv antibiotics through 8/17. Secure message sent to home health pharmacy.  Leatrice Jewels, RMA

## 2021-12-28 NOTE — Progress Notes (Unsigned)
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, right 12/01/2021   Protein-calorie malnutrition, severe 11/27/2021   Failure to thrive in adult    Leukocytosis 11/25/2021   Acute renal failure superimposed on stage 4 chronic kidney disease ([place]) 11/25/2021   FTT (failure to thrive) in adult 11/25/2021   Dislocation of internal right hip prosthesis, initial encounter ([place]) 11/25/2021   Transaminitis 11/25/2021   Dysphagia 11/25/2021   Essential hypertension 11/25/2021   Gout 11/25/2021   CKD (chronic kidney disease), symptom management only, stage 4 (severe) ([place]) 04/23/2021   History of anemia due to chronic kidney disease 04/23/2021    Patient's Medications  New Prescriptions   No medications on file  Previous Medications   ACETAMINOPHEN (TYLENOL) 500 MG TABLET    Take 1,000 mg by mouth 2 (two) times daily as needed for headache (pain).   ALLOPURINOL (ZYLOPRIM) 100 MG TABLET    Take 100 mg by mouth every morning.   ASPIRIN 81 MG CHEWABLE TABLET    Chew 1 tablet (81 mg total) by mouth 2 (two) times daily.   ATORVASTATIN (LIPITOR) 20 MG TABLET    Take 20 mg by mouth every morning.   CARVEDILOL (COREG) 3.125 MG TABLET    Take 3.125 mg by mouth 2 (two) times daily with a meal.   CEFTRIAXONE (ROCEPHIN) IVPB    Inject 2 g into the vein daily. Indication:  Strep R-hip PJI First Dose: Yes Last Day of Therapy:  01/12/22 Labs - Once weekly:  CBC/D and BMP, Labs - Every other week:  ESR and CRP Method of administration: IV Push Method of administration may be changed at the discretion of home infusion pharmacist based upon assessment of the patient and/or caregiver's ability to self-administer the medication ordered.   DORZOLAMIDE (TRUSOPT) 2 % OPHTHALMIC SOLUTION    Place 1 drop into both eyes 2 (two) times daily.   LATANOPROST (XALATAN) 0.005 % OPHTHALMIC SOLUTION    Place 1 drop into both eyes at  bedtime.   METHOCARBAMOL (ROBAXIN) 500 MG TABLET    Take 1 tablet (500 mg total) by mouth every 6 (six) hours as needed for muscle spasms.   OXYCODONE (OXY IR/ROXICODONE) 5 MG IMMEDIATE RELEASE TABLET    Take 1 tablet (5 mg total) by mouth every 4 (four) hours as needed for moderate pain.   PANTOPRAZOLE (PROTONIX) 40 MG TABLET    Take 1 tablet (40 mg total) by mouth daily at 6 (six) AM.  Modified Medications   No medications on file  Discontinued Medications   No medications on file    Subjective: 88 year old male with history of right total hip arthroplasty done [phone]?) with a revision in 2008 by Dr. Alvan Dame presents for hospital follow up.  He was admitted to [facility] from 6/21-7/1 for right hip prosthetic joint infection.  He last saw Dr. Alvan Dame in 2019 and was lost to follow-up.  CT showed extensive bone destruction, loose prosthesis and large pseudotumors. Take to OR and underwent resection of right total hip Girdlestone procedure and excision excisional debridement.  Patient was initially on ceftriaxone and daptomycin he was then transitioned to ceftriaxone EOT 8/8 at Cx+ staph salivarius.  Dr. Alvan Dame noted possible follow-up CT to benefit any further fluid collections. Today 7/24:Sitting in wheelchair. Reports he is adherent to his antibiotics, family administers it. Reports some drainage from hip wound. Denies fevers  and chill. s  Review of Systems: Review of Systems  All other systems reviewed and are negative.   Past Medical History:  Diagnosis Date   CKD (chronic kidney disease), symptom management only, stage 4 (severe) (HCC)    Glaucoma    POAG OU   History of anemia due to chronic kidney disease    Hypertensive retinopathy    OU    Social History   Tobacco Use   Smoking status: Never   Smokeless tobacco: Never  Substance Use Topics   Alcohol use: Not Currently    No family history on file.  No Known Allergies  Health Maintenance  Topic Date Due   Pneumonia  Vaccine 11+ Years old (1 - PCV) Never done   TETANUS/TDAP  Never done   Zoster Vaccines- Shingrix (1 of 2) Never done   COVID-19 Vaccine (3 - Moderna risk series) 09/01/2019   INFLUENZA VACCINE  01/05/2022   HPV VACCINES  Aged Out    Objective:  There were no vitals filed for this visit. There is no height or weight on file to calculate BMI.  Physical Exam Constitutional:      General: He is not in acute distress.    Appearance: He is normal weight. He is not toxic-appearing.  HENT:     Head: Normocephalic and atraumatic.     Right Ear: External ear normal.     Left Ear: External ear normal.     Nose: No congestion or rhinorrhea.     Mouth/Throat:     Mouth: Mucous membranes are moist.     Pharynx: Oropharynx is clear.  Eyes:     Extraocular Movements: Extraocular movements intact.     Conjunctiva/sclera: Conjunctivae normal.     Pupils: Pupils are equal, round, and reactive to light.  Cardiovascular:     Rate and Rhythm: Normal rate and regular rhythm.     Heart sounds: No murmur heard.    No friction rub. No gallop.  Pulmonary:     Effort: Pulmonary effort is normal.     Breath sounds: Normal breath sounds.  Abdominal:     General: Abdomen is flat. Bowel sounds are normal.     Palpations: Abdomen is soft.  Musculoskeletal:        General: No swelling. Normal range of motion.     Cervical back: Normal range of motion and neck supple.  Skin:    General: Skin is warm and dry.     Comments: Mild drainage from right hip wound noted.  Neurological:     General: No focal deficit present.     Mental Status: He is oriented to person, place, and time.  Psychiatric:        Mood and Affect: Mood normal.     Lab Results Lab Results  Component Value Date   WBC 16.0 (H) 12/04/2021   HGB 8.2 (L) 12/09/2021   HCT 24.6 (L) 12/04/2021   MCV 84.8 12/04/2021   PLT 447 (H) 12/04/2021    Lab Results  Component Value Date   CREATININE 2.57 (H) 12/04/2021   BUN 71 (H)  12/04/2021   NA 137 12/04/2021   K 4.4 12/04/2021   CL 112 (H) 12/04/2021   CO2 17 (L) 12/04/2021    Lab Results  Component Value Date   ALT 36 12/02/2021   AST 34 12/02/2021   ALKPHOS 88 12/02/2021   BILITOT 0.7 12/02/2021    No results found for: "CHOL", "HDL", "LDLCALC", "LDLDIRECT", "TRIG", "CHOLHDL"  No results found for: "LABRPR", "RPRTITER" No results found for: "HIV1RNAQUANT", "HIV1RNAVL", "CD4TABS"   #Right Hip PJI SP resetion R total hip arthoplasty, girdleston, debridement with OR + Strep salivarius on 6/27 -Labs 7/19 wbc 7.3, Scr 1.78, ESR 18, CRP 50 -Per OR note prulent fluid identified in post capsular layer and sent for Cx which grew Strep -Seen by Dr. Alvan Dame on 7/12 and plan on xray at next visit, follow up on 8/16. -Pt has mild drainage from his wound(without c/f surrounding infection), as such will extend antibiotics till pt follows up with Ortho on 8/16, ID appt with Terri Piedra on 8/17 to assess clinical progression. I suspect given extent of disease pt likely need repeat debridement. I sent a message to Ortho as well per finding of drainage, to see if appt can be moved up.   OPAT ORDERS:  Diagnosis: Strep salivarius PJI  Culture Result: Strep salivarius  No Known Allergies   Discharge antibiotics to be given via PICC line:  Per pharmacy protocol ctx 2gm q24h Aim for Vancomycin trough 15-20 or AUC 400-550 (unless otherwise indicated)   Duration: 7.5w End Date: 8/17  [facility] Care Per Protocol with [employer] Use: Home health RN for IV administration and teaching, line care and labs.    Labs weekly while on IV antibiotics: __ CBC with differential __ CMP __ CRP __ ESR   __ Please pull PIC at completion of IV antibiotics   Fax weekly labs to [phone]  Clinic Follow Up Appt: 8/17  @ [facility] with Terri Piedra   I spent more than 65 minutes for this patient encounter including reviewing data/chart, and coordinating care and >50% direct face to  face time providing counseling/discussing diagnostics/treatment plan with patient  Laurice Record, [facility] 12/28/2021, 2:56 PM

## 2021-12-28 NOTE — Telephone Encounter (Signed)
Thank you :)

## 2021-12-29 DIAGNOSIS — G7119 Other specified myotonic disorders: Secondary | ICD-10-CM | POA: Diagnosis not present

## 2021-12-29 NOTE — Telephone Encounter (Signed)
Received confirmation for orders from [place], [place]

## 2021-12-30 DIAGNOSIS — M1030 Gout due to renal impairment, unspecified site: Secondary | ICD-10-CM | POA: Diagnosis not present

## 2021-12-30 DIAGNOSIS — T84020D Dislocation of internal right hip prosthesis, subsequent encounter: Secondary | ICD-10-CM | POA: Diagnosis not present

## 2021-12-30 DIAGNOSIS — I129 Hypertensive chronic kidney disease with stage 1 through stage 4 chronic kidney disease, or unspecified chronic kidney disease: Secondary | ICD-10-CM | POA: Diagnosis not present

## 2021-12-30 DIAGNOSIS — D631 Anemia in chronic kidney disease: Secondary | ICD-10-CM | POA: Diagnosis not present

## 2021-12-30 DIAGNOSIS — N184 Chronic kidney disease, stage 4 (severe): Secondary | ICD-10-CM | POA: Diagnosis not present

## 2021-12-30 DIAGNOSIS — N179 Acute kidney failure, unspecified: Secondary | ICD-10-CM | POA: Diagnosis not present

## 2021-12-31 DIAGNOSIS — M1030 Gout due to renal impairment, unspecified site: Secondary | ICD-10-CM | POA: Diagnosis not present

## 2021-12-31 DIAGNOSIS — N179 Acute kidney failure, unspecified: Secondary | ICD-10-CM | POA: Diagnosis not present

## 2021-12-31 DIAGNOSIS — I129 Hypertensive chronic kidney disease with stage 1 through stage 4 chronic kidney disease, or unspecified chronic kidney disease: Secondary | ICD-10-CM | POA: Diagnosis not present

## 2021-12-31 DIAGNOSIS — N184 Chronic kidney disease, stage 4 (severe): Secondary | ICD-10-CM | POA: Diagnosis not present

## 2021-12-31 DIAGNOSIS — D631 Anemia in chronic kidney disease: Secondary | ICD-10-CM | POA: Diagnosis not present

## 2021-12-31 DIAGNOSIS — T84020D Dislocation of internal right hip prosthesis, subsequent encounter: Secondary | ICD-10-CM | POA: Diagnosis not present

## 2022-01-03 DIAGNOSIS — M009 Pyogenic arthritis, unspecified: Secondary | ICD-10-CM | POA: Diagnosis not present

## 2022-01-03 DIAGNOSIS — N179 Acute kidney failure, unspecified: Secondary | ICD-10-CM | POA: Diagnosis not present

## 2022-01-03 DIAGNOSIS — T8452XA Infection and inflammatory reaction due to internal left hip prosthesis, initial encounter: Secondary | ICD-10-CM | POA: Diagnosis not present

## 2022-01-03 DIAGNOSIS — T84020A Dislocation of internal right hip prosthesis, initial encounter: Secondary | ICD-10-CM | POA: Diagnosis not present

## 2022-01-03 DIAGNOSIS — R627 Adult failure to thrive: Secondary | ICD-10-CM | POA: Diagnosis not present

## 2022-01-04 DIAGNOSIS — N184 Chronic kidney disease, stage 4 (severe): Secondary | ICD-10-CM | POA: Diagnosis not present

## 2022-01-04 DIAGNOSIS — E7800 Pure hypercholesterolemia, unspecified: Secondary | ICD-10-CM | POA: Diagnosis not present

## 2022-01-04 DIAGNOSIS — I129 Hypertensive chronic kidney disease with stage 1 through stage 4 chronic kidney disease, or unspecified chronic kidney disease: Secondary | ICD-10-CM | POA: Diagnosis not present

## 2022-01-05 DIAGNOSIS — T84020A Dislocation of internal right hip prosthesis, initial encounter: Secondary | ICD-10-CM | POA: Diagnosis not present

## 2022-01-05 DIAGNOSIS — N179 Acute kidney failure, unspecified: Secondary | ICD-10-CM | POA: Diagnosis not present

## 2022-01-05 DIAGNOSIS — R627 Adult failure to thrive: Secondary | ICD-10-CM | POA: Diagnosis not present

## 2022-01-05 DIAGNOSIS — T8452XA Infection and inflammatory reaction due to internal left hip prosthesis, initial encounter: Secondary | ICD-10-CM | POA: Diagnosis not present

## 2022-01-05 DIAGNOSIS — M009 Pyogenic arthritis, unspecified: Secondary | ICD-10-CM | POA: Diagnosis not present

## 2022-01-05 DIAGNOSIS — Z89621 Acquired absence of right hip joint: Secondary | ICD-10-CM | POA: Diagnosis not present

## 2022-01-06 ENCOUNTER — Encounter (HOSPITAL_COMMUNITY)
Admission: RE | Admit: 2022-01-06 | Discharge: 2022-01-06 | Disposition: A | Discharge status: Home / Self Care | Payer: Medicare Other | Source: Ambulatory Visit | Attending: Nephrology | Admitting: Nephrology

## 2022-01-06 VITALS — BP 160/57 | HR 65 | Resp 17

## 2022-01-06 DIAGNOSIS — Z862 Personal history of diseases of the blood and blood-forming organs and certain disorders involving the immune mechanism: Secondary | ICD-10-CM | POA: Diagnosis not present

## 2022-01-06 DIAGNOSIS — N189 Chronic kidney disease, unspecified: Secondary | ICD-10-CM | POA: Diagnosis not present

## 2022-01-06 DIAGNOSIS — N184 Chronic kidney disease, stage 4 (severe): Secondary | ICD-10-CM | POA: Diagnosis not present

## 2022-01-06 LAB — RENAL FUNCTION PANEL
Albumin: 2.1 g/dL — ABNORMAL LOW (ref 3.5–5.0)
Anion gap: 7 (ref 5–15)
BUN: 20 mg/dL (ref 8–23)
CO2: 23 mmol/L (ref 22–32)
Calcium: 9.1 mg/dL (ref 8.9–10.3)
Chloride: 106 mmol/L (ref 98–111)
Creatinine, Ser: 1.74 mg/dL — ABNORMAL HIGH (ref 0.61–1.24)
GFR, Estimated: 36 mL/min — ABNORMAL LOW (ref >60)
Glucose, Bld: 104 mg/dL — ABNORMAL HIGH (ref 70–99)
Phosphorus: 3 mg/dL (ref 2.5–4.6)
Potassium: 4.3 mmol/L (ref 3.5–5.1)
Sodium: 136 mmol/L (ref 135–145)

## 2022-01-06 LAB — IRON AND TIBC
Iron: 17 ug/dL — ABNORMAL LOW (ref 45–182)
Saturation Ratios: 13 % — ABNORMAL LOW (ref 17.9–39.5)
TIBC: 127 ug/dL — ABNORMAL LOW (ref 250–450)
UIBC: 110 ug/dL

## 2022-01-06 LAB — POCT HEMOGLOBIN-HEMACUE: Hemoglobin: 8 g/dL — ABNORMAL LOW (ref 13.0–17.0)

## 2022-01-06 LAB — FERRITIN: Ferritin: 665 ng/mL — ABNORMAL HIGH (ref 24–336)

## 2022-01-06 MED ORDER — EPOETIN ALFA 20000 UNIT/ML IJ SOLN
INTRAMUSCULAR | Status: AC
  Filled 2022-01-06: qty 1

## 2022-01-06 MED ORDER — EPOETIN ALFA-EPBX 40000 UNIT/ML IJ SOLN: 20000.0000 [IU] | INTRAMUSCULAR | Status: DC

## 2022-01-06 MED ORDER — EPOETIN ALFA 20000 UNIT/ML IJ SOLN
20000.0000 [IU] | Freq: Once | INTRAMUSCULAR | Status: AC
  Administered 2022-01-06: 20000 [IU] via SUBCUTANEOUS

## 2022-01-07 DIAGNOSIS — N184 Chronic kidney disease, stage 4 (severe): Secondary | ICD-10-CM | POA: Diagnosis not present

## 2022-01-07 DIAGNOSIS — H353221 Exudative age-related macular degeneration, left eye, with active choroidal neovascularization: Secondary | ICD-10-CM | POA: Diagnosis not present

## 2022-01-07 DIAGNOSIS — M1030 Gout due to renal impairment, unspecified site: Secondary | ICD-10-CM | POA: Diagnosis not present

## 2022-01-07 DIAGNOSIS — D631 Anemia in chronic kidney disease: Secondary | ICD-10-CM | POA: Diagnosis not present

## 2022-01-07 DIAGNOSIS — N179 Acute kidney failure, unspecified: Secondary | ICD-10-CM | POA: Diagnosis not present

## 2022-01-07 DIAGNOSIS — H18423 Band keratopathy, bilateral: Secondary | ICD-10-CM | POA: Diagnosis not present

## 2022-01-07 DIAGNOSIS — I129 Hypertensive chronic kidney disease with stage 1 through stage 4 chronic kidney disease, or unspecified chronic kidney disease: Secondary | ICD-10-CM | POA: Diagnosis not present

## 2022-01-07 DIAGNOSIS — H401133 Primary open-angle glaucoma, bilateral, severe stage: Secondary | ICD-10-CM | POA: Diagnosis not present

## 2022-01-07 DIAGNOSIS — T84020D Dislocation of internal right hip prosthesis, subsequent encounter: Secondary | ICD-10-CM | POA: Diagnosis not present

## 2022-01-07 DIAGNOSIS — H524 Presbyopia: Secondary | ICD-10-CM | POA: Diagnosis not present

## 2022-01-07 LAB — PTH, INTACT AND CALCIUM
Calcium, Total (PTH): 9 mg/dL (ref 8.6–10.2)
PTH: 164 pg/mL — ABNORMAL HIGH (ref 15–65)

## 2022-01-10 DIAGNOSIS — T8452XA Infection and inflammatory reaction due to internal left hip prosthesis, initial encounter: Secondary | ICD-10-CM | POA: Diagnosis not present

## 2022-01-10 DIAGNOSIS — M009 Pyogenic arthritis, unspecified: Secondary | ICD-10-CM | POA: Diagnosis not present

## 2022-01-12 DIAGNOSIS — T8452XA Infection and inflammatory reaction due to internal left hip prosthesis, initial encounter: Secondary | ICD-10-CM | POA: Diagnosis not present

## 2022-01-12 DIAGNOSIS — M009 Pyogenic arthritis, unspecified: Secondary | ICD-10-CM | POA: Diagnosis not present

## 2022-01-15 DIAGNOSIS — T84020D Dislocation of internal right hip prosthesis, subsequent encounter: Secondary | ICD-10-CM | POA: Diagnosis not present

## 2022-01-15 DIAGNOSIS — I129 Hypertensive chronic kidney disease with stage 1 through stage 4 chronic kidney disease, or unspecified chronic kidney disease: Secondary | ICD-10-CM | POA: Diagnosis not present

## 2022-01-15 DIAGNOSIS — N184 Chronic kidney disease, stage 4 (severe): Secondary | ICD-10-CM | POA: Diagnosis not present

## 2022-01-15 DIAGNOSIS — D631 Anemia in chronic kidney disease: Secondary | ICD-10-CM | POA: Diagnosis not present

## 2022-01-15 DIAGNOSIS — M1030 Gout due to renal impairment, unspecified site: Secondary | ICD-10-CM | POA: Diagnosis not present

## 2022-01-15 DIAGNOSIS — N179 Acute kidney failure, unspecified: Secondary | ICD-10-CM | POA: Diagnosis not present

## 2022-01-17 DIAGNOSIS — T8452XA Infection and inflammatory reaction due to internal left hip prosthesis, initial encounter: Secondary | ICD-10-CM | POA: Diagnosis not present

## 2022-01-17 DIAGNOSIS — M009 Pyogenic arthritis, unspecified: Secondary | ICD-10-CM | POA: Diagnosis not present

## 2022-01-19 DIAGNOSIS — T8459XA Infection and inflammatory reaction due to other internal joint prosthesis, initial encounter: Secondary | ICD-10-CM | POA: Diagnosis not present

## 2022-01-19 DIAGNOSIS — N184 Chronic kidney disease, stage 4 (severe): Secondary | ICD-10-CM | POA: Diagnosis not present

## 2022-01-19 DIAGNOSIS — I129 Hypertensive chronic kidney disease with stage 1 through stage 4 chronic kidney disease, or unspecified chronic kidney disease: Secondary | ICD-10-CM | POA: Diagnosis not present

## 2022-01-19 DIAGNOSIS — N179 Acute kidney failure, unspecified: Secondary | ICD-10-CM | POA: Diagnosis not present

## 2022-01-19 DIAGNOSIS — Z89621 Acquired absence of right hip joint: Secondary | ICD-10-CM | POA: Diagnosis not present

## 2022-01-19 DIAGNOSIS — T8452XA Infection and inflammatory reaction due to internal left hip prosthesis, initial encounter: Secondary | ICD-10-CM | POA: Diagnosis not present

## 2022-01-19 DIAGNOSIS — M009 Pyogenic arthritis, unspecified: Secondary | ICD-10-CM | POA: Diagnosis not present

## 2022-01-19 DIAGNOSIS — T84020D Dislocation of internal right hip prosthesis, subsequent encounter: Secondary | ICD-10-CM | POA: Diagnosis not present

## 2022-01-19 DIAGNOSIS — D631 Anemia in chronic kidney disease: Secondary | ICD-10-CM | POA: Diagnosis not present

## 2022-01-19 DIAGNOSIS — M1030 Gout due to renal impairment, unspecified site: Secondary | ICD-10-CM | POA: Diagnosis not present

## 2022-01-21 ENCOUNTER — Other Ambulatory Visit: Payer: Self-pay

## 2022-01-21 ENCOUNTER — Telehealth: Payer: Self-pay

## 2022-01-21 ENCOUNTER — Ambulatory Visit: Payer: Medicare Other | Admitting: Family

## 2022-01-21 ENCOUNTER — Encounter: Payer: Self-pay | Admitting: Family

## 2022-01-21 VITALS — BP 165/55 | HR 67 | Temp 98.0°F | Resp 16

## 2022-01-21 DIAGNOSIS — N179 Acute kidney failure, unspecified: Secondary | ICD-10-CM | POA: Diagnosis not present

## 2022-01-21 DIAGNOSIS — Z89621 Acquired absence of right hip joint: Secondary | ICD-10-CM

## 2022-01-21 DIAGNOSIS — D631 Anemia in chronic kidney disease: Secondary | ICD-10-CM | POA: Diagnosis not present

## 2022-01-21 DIAGNOSIS — I129 Hypertensive chronic kidney disease with stage 1 through stage 4 chronic kidney disease, or unspecified chronic kidney disease: Secondary | ICD-10-CM | POA: Diagnosis not present

## 2022-01-21 DIAGNOSIS — M1030 Gout due to renal impairment, unspecified site: Secondary | ICD-10-CM | POA: Diagnosis not present

## 2022-01-21 DIAGNOSIS — T84020D Dislocation of internal right hip prosthesis, subsequent encounter: Secondary | ICD-10-CM | POA: Diagnosis not present

## 2022-01-21 DIAGNOSIS — N184 Chronic kidney disease, stage 4 (severe): Secondary | ICD-10-CM | POA: Diagnosis not present

## 2022-01-21 NOTE — Patient Instructions (Signed)
Nice to see you.  We will stop antibiotics and have the PICC line removed.   No further antibiotics are needed at this time.   Continue to follow up with Dr. Alvan Dame as scheduled.   Follow up with ID as needed.  Have a great day and stay safe!

## 2022-01-21 NOTE — Progress Notes (Signed)
Subjective:    Patient ID: Danny Rhodes, male    DOB: 07/25/1926, 88 y.o.   MRN: [id number]  Chief Complaint  Patient presents with   Follow-up    Infection of prosthetic joint, sequela    HPI:  Danny Rhodes is a 88 y.o. 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 Candiss Norse on 12/28/21 with good tolerance to Ceftriaxone and noted to have continued drainage from the site. Recommended Orthopedic follow up and continued Ceftriaxone for 2 additional weeks and here today for follow up.   Danny Rhodes is here with his daughter and has been receiving his Ceftriaxone through his PICC line with no problems. Seen by Orthopedics yesterday and everything is healed over with no further drainage. Doing well and now permitted to start weight bearing exercise. Denies pain, fevers, chills or sweats.    No Known Allergies    Outpatient Medications Prior to Visit  Medication Sig Dispense Refill   acetaminophen (TYLENOL) 500 MG tablet Take 1,000 mg by mouth 2 (two) times daily as needed for headache (pain).     amLODipine (NORVASC) 10 MG tablet 1 tablet     aspirin 81 MG chewable tablet Chew 1 tablet (81 mg total) by mouth 2 (two) times daily.     atorvastatin (LIPITOR) 20 MG tablet Take 20 mg by mouth every morning.  2   carvedilol (COREG) 3.125 MG tablet Take 3.125 mg by mouth 2 (two) times daily with a meal.     dorzolamide (TRUSOPT) 2 % ophthalmic solution Place 1 drop into both eyes 2 (two) times daily.     latanoprost (XALATAN) 0.005 % ophthalmic solution Place 1 drop into both eyes at bedtime.     methocarbamol (ROBAXIN) 500 MG tablet Take 1 tablet (500 mg total) by mouth every 6 (six) hours as needed for muscle spasms. 20 tablet 0   mirtazapine (REMERON) 15 MG tablet Take 15 mg by mouth at bedtime.     oxyCODONE (OXY IR/ROXICODONE) 5 MG immediate release tablet Take 1 tablet (5 mg total) by mouth every 4 (four)  hours as needed for moderate pain. 10 tablet 0   pantoprazole (PROTONIX) 40 MG tablet Take 1 tablet (40 mg total) by mouth daily at 6 (six) AM. 30 tablet 0   allopurinol (ZYLOPRIM) 100 MG tablet Take 100 mg by mouth every morning. (Patient not taking: Reported on 12/28/2021)     No facility-administered medications prior to visit.     Past Medical History:  Diagnosis Date   CKD (chronic kidney disease), symptom management only, stage 4 (severe) (HCC)    Glaucoma    POAG OU   History of anemia due to chronic kidney disease    Hypertensive retinopathy    OU     Past Surgical History:  Procedure Laterality Date   CATARACT EXTRACTION Bilateral    EYE SURGERY Bilateral    Cat Sx   Hip replacement Right    20 yrs ago   TOTAL HIP ARTHROPLASTY Right 12/01/2021   Procedure: EXCISIONAL TOTAL HIP ARTHROPLASTY WITH POSTERIOR APPROACH;  Surgeon: Paralee Cancel, MD;  Location: [facility];  Service: Orthopedics;  Laterality: Right;       Review of Systems  Constitutional:  Negative for chills, diaphoresis, fatigue and fever.  Respiratory:  Negative for cough, chest tightness, shortness of breath and wheezing.   Cardiovascular:  Negative for chest pain.  Gastrointestinal:  Negative for abdominal pain, diarrhea,  nausea and vomiting.      Objective:    BP (!) 165/55   Pulse 67   Temp 98 F (36.7 C) (Oral)   Resp 16   SpO2 100%  Nursing note and vital signs reviewed.  Physical Exam Constitutional:      General: He is not in acute distress.    Appearance: He is well-developed.  Cardiovascular:     Rate and Rhythm: Normal rate and regular rhythm.     Heart sounds: Normal heart sounds.  Pulmonary:     Effort: Pulmonary effort is normal.     Breath sounds: Normal breath sounds.  Musculoskeletal:     Comments: Surgical incision is approximated and well healed along with silver dollar sized wound just posterior to it. No induration or fluctuance.   Skin:    General: Skin is warm and  dry.  Neurological:     Mental Status: He is alert and oriented to person, place, and time.  Psychiatric:        Behavior: Behavior normal.        Thought Content: Thought content normal.        Judgment: Judgment normal.         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, right 12/01/2021   Protein-calorie malnutrition, severe 11/27/2021   Failure to thrive in adult    Leukocytosis 11/25/2021   Acute renal failure superimposed on stage 4 chronic kidney disease ([place]) 11/25/2021   FTT (failure to thrive) in adult 11/25/2021   Dislocation of internal right hip prosthesis, initial encounter ([place]) 11/25/2021   Transaminitis 11/25/2021   Dysphagia 11/25/2021   Essential hypertension 11/25/2021   Gout 11/25/2021   CKD (chronic kidney disease), symptom management only, stage 4 (severe) ([place]) 04/23/2021   History of anemia due to chronic kidney disease 04/23/2021     Problem List Items Addressed This Visit       Other   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 Will stop antibiotics and send confirmation order to Home Health to have PICC line removed. Continue with additional follow up per Dr. Alvan Dame and can follow up with ID as needed.         I am having Phuoc L. Demo maintain his atorvastatin, acetaminophen, dorzolamide, latanoprost, allopurinol, carvedilol, aspirin, oxyCODONE, methocarbamol, pantoprazole, mirtazapine, and amLODipine.   Follow-up: Return if symptoms worsen  or fail to improve.   Terri Piedra, MSN, FNP-C Nurse Practitioner [facility] for Infectious Disease [place] number: [phone]

## 2022-01-21 NOTE — Assessment & Plan Note (Signed)
Danny Rhodes has completed 8 weeks of IV ceftriaxone for Streptococcus salivarius prosthetic joint infection s/p explantation and is doing very well with wounds that are now healed over with no additional signs of infection. Will stop antibiotics and send confirmation order to Home Health to have PICC line removed. Continue with additional follow up per Dr. Alvan Dame and can follow up with ID as needed.

## 2022-01-21 NOTE — Telephone Encounter (Signed)
Community message sent to [employer] staff and [facility] pharmacy staff -  Per Terri Piedra, NP - Pull PICC line after last dose of IV ceftriaxone today 8/17.   [place], CMA

## 2022-01-22 DIAGNOSIS — T8452XA Infection and inflammatory reaction due to internal left hip prosthesis, initial encounter: Secondary | ICD-10-CM | POA: Diagnosis not present

## 2022-01-22 DIAGNOSIS — M009 Pyogenic arthritis, unspecified: Secondary | ICD-10-CM | POA: Diagnosis not present

## 2022-01-27 DIAGNOSIS — I129 Hypertensive chronic kidney disease with stage 1 through stage 4 chronic kidney disease, or unspecified chronic kidney disease: Secondary | ICD-10-CM | POA: Diagnosis not present

## 2022-01-27 DIAGNOSIS — N184 Chronic kidney disease, stage 4 (severe): Secondary | ICD-10-CM | POA: Diagnosis not present

## 2022-01-27 DIAGNOSIS — M1030 Gout due to renal impairment, unspecified site: Secondary | ICD-10-CM | POA: Diagnosis not present

## 2022-01-27 DIAGNOSIS — T84020D Dislocation of internal right hip prosthesis, subsequent encounter: Secondary | ICD-10-CM | POA: Diagnosis not present

## 2022-01-27 DIAGNOSIS — N179 Acute kidney failure, unspecified: Secondary | ICD-10-CM | POA: Diagnosis not present

## 2022-01-27 DIAGNOSIS — D631 Anemia in chronic kidney disease: Secondary | ICD-10-CM | POA: Diagnosis not present

## 2022-01-29 DIAGNOSIS — N184 Chronic kidney disease, stage 4 (severe): Secondary | ICD-10-CM | POA: Diagnosis not present

## 2022-01-29 DIAGNOSIS — I129 Hypertensive chronic kidney disease with stage 1 through stage 4 chronic kidney disease, or unspecified chronic kidney disease: Secondary | ICD-10-CM | POA: Diagnosis not present

## 2022-01-29 DIAGNOSIS — N179 Acute kidney failure, unspecified: Secondary | ICD-10-CM | POA: Diagnosis not present

## 2022-01-29 DIAGNOSIS — D631 Anemia in chronic kidney disease: Secondary | ICD-10-CM | POA: Diagnosis not present

## 2022-01-29 DIAGNOSIS — M1030 Gout due to renal impairment, unspecified site: Secondary | ICD-10-CM | POA: Diagnosis not present

## 2022-01-29 DIAGNOSIS — T84020D Dislocation of internal right hip prosthesis, subsequent encounter: Secondary | ICD-10-CM | POA: Diagnosis not present

## 2022-02-01 DIAGNOSIS — N179 Acute kidney failure, unspecified: Secondary | ICD-10-CM | POA: Diagnosis not present

## 2022-02-01 DIAGNOSIS — D631 Anemia in chronic kidney disease: Secondary | ICD-10-CM | POA: Diagnosis not present

## 2022-02-01 DIAGNOSIS — N184 Chronic kidney disease, stage 4 (severe): Secondary | ICD-10-CM | POA: Diagnosis not present

## 2022-02-01 DIAGNOSIS — T84020D Dislocation of internal right hip prosthesis, subsequent encounter: Secondary | ICD-10-CM | POA: Diagnosis not present

## 2022-02-01 DIAGNOSIS — I129 Hypertensive chronic kidney disease with stage 1 through stage 4 chronic kidney disease, or unspecified chronic kidney disease: Secondary | ICD-10-CM | POA: Diagnosis not present

## 2022-02-01 DIAGNOSIS — M1030 Gout due to renal impairment, unspecified site: Secondary | ICD-10-CM | POA: Diagnosis not present

## 2022-02-03 ENCOUNTER — Encounter (HOSPITAL_COMMUNITY)
Admission: RE | Admit: 2022-02-03 | Discharge: 2022-02-03 | Disposition: A | Discharge status: Home / Self Care | Payer: Medicare Other | Source: Ambulatory Visit | Attending: Nephrology | Admitting: Nephrology

## 2022-02-03 VITALS — BP 144/45 | HR 64 | Temp 98.0°F | Resp 12

## 2022-02-03 DIAGNOSIS — N179 Acute kidney failure, unspecified: Secondary | ICD-10-CM | POA: Diagnosis not present

## 2022-02-03 DIAGNOSIS — N189 Chronic kidney disease, unspecified: Secondary | ICD-10-CM

## 2022-02-03 DIAGNOSIS — N184 Chronic kidney disease, stage 4 (severe): Secondary | ICD-10-CM

## 2022-02-03 DIAGNOSIS — Z862 Personal history of diseases of the blood and blood-forming organs and certain disorders involving the immune mechanism: Secondary | ICD-10-CM | POA: Diagnosis not present

## 2022-02-03 DIAGNOSIS — R627 Adult failure to thrive: Secondary | ICD-10-CM | POA: Diagnosis not present

## 2022-02-03 DIAGNOSIS — T84020A Dislocation of internal right hip prosthesis, initial encounter: Secondary | ICD-10-CM | POA: Diagnosis not present

## 2022-02-03 MED ORDER — EPOETIN ALFA-EPBX 10000 UNIT/ML IJ SOLN: 20000.0000 [IU] | INTRAMUSCULAR | Status: DC

## 2022-02-03 MED ORDER — EPOETIN ALFA 20000 UNIT/ML IJ SOLN
20000.0000 [IU] | Freq: Once | INTRAMUSCULAR | Status: AC
  Administered 2022-02-03: 20000 [IU] via SUBCUTANEOUS

## 2022-02-03 MED ORDER — EPOETIN ALFA 20000 UNIT/ML IJ SOLN
INTRAMUSCULAR | Status: AC
  Filled 2022-02-03: qty 1

## 2022-02-04 DIAGNOSIS — T84020D Dislocation of internal right hip prosthesis, subsequent encounter: Secondary | ICD-10-CM | POA: Diagnosis not present

## 2022-02-04 DIAGNOSIS — D631 Anemia in chronic kidney disease: Secondary | ICD-10-CM | POA: Diagnosis not present

## 2022-02-04 DIAGNOSIS — M1A0710 Idiopathic chronic gout, right ankle and foot, without tophus (tophi): Secondary | ICD-10-CM | POA: Diagnosis not present

## 2022-02-04 DIAGNOSIS — N184 Chronic kidney disease, stage 4 (severe): Secondary | ICD-10-CM | POA: Diagnosis not present

## 2022-02-04 DIAGNOSIS — E871 Hypo-osmolality and hyponatremia: Secondary | ICD-10-CM | POA: Diagnosis not present

## 2022-02-04 DIAGNOSIS — M1030 Gout due to renal impairment, unspecified site: Secondary | ICD-10-CM | POA: Diagnosis not present

## 2022-02-04 DIAGNOSIS — E7800 Pure hypercholesterolemia, unspecified: Secondary | ICD-10-CM | POA: Diagnosis not present

## 2022-02-04 DIAGNOSIS — N2581 Secondary hyperparathyroidism of renal origin: Secondary | ICD-10-CM | POA: Diagnosis not present

## 2022-02-04 DIAGNOSIS — I129 Hypertensive chronic kidney disease with stage 1 through stage 4 chronic kidney disease, or unspecified chronic kidney disease: Secondary | ICD-10-CM | POA: Diagnosis not present

## 2022-02-04 DIAGNOSIS — N179 Acute kidney failure, unspecified: Secondary | ICD-10-CM | POA: Diagnosis not present

## 2022-02-05 DIAGNOSIS — D631 Anemia in chronic kidney disease: Secondary | ICD-10-CM | POA: Diagnosis not present

## 2022-02-05 DIAGNOSIS — I129 Hypertensive chronic kidney disease with stage 1 through stage 4 chronic kidney disease, or unspecified chronic kidney disease: Secondary | ICD-10-CM | POA: Diagnosis not present

## 2022-02-05 DIAGNOSIS — N184 Chronic kidney disease, stage 4 (severe): Secondary | ICD-10-CM | POA: Diagnosis not present

## 2022-02-05 DIAGNOSIS — R627 Adult failure to thrive: Secondary | ICD-10-CM | POA: Diagnosis not present

## 2022-02-05 DIAGNOSIS — M1030 Gout due to renal impairment, unspecified site: Secondary | ICD-10-CM | POA: Diagnosis not present

## 2022-02-05 DIAGNOSIS — T84020D Dislocation of internal right hip prosthesis, subsequent encounter: Secondary | ICD-10-CM | POA: Diagnosis not present

## 2022-02-05 DIAGNOSIS — N179 Acute kidney failure, unspecified: Secondary | ICD-10-CM | POA: Diagnosis not present

## 2022-02-05 DIAGNOSIS — T84020A Dislocation of internal right hip prosthesis, initial encounter: Secondary | ICD-10-CM | POA: Diagnosis not present

## 2022-02-10 DIAGNOSIS — N184 Chronic kidney disease, stage 4 (severe): Secondary | ICD-10-CM | POA: Diagnosis not present

## 2022-02-10 DIAGNOSIS — I129 Hypertensive chronic kidney disease with stage 1 through stage 4 chronic kidney disease, or unspecified chronic kidney disease: Secondary | ICD-10-CM | POA: Diagnosis not present

## 2022-02-10 DIAGNOSIS — N179 Acute kidney failure, unspecified: Secondary | ICD-10-CM | POA: Diagnosis not present

## 2022-02-10 DIAGNOSIS — T84020D Dislocation of internal right hip prosthesis, subsequent encounter: Secondary | ICD-10-CM | POA: Diagnosis not present

## 2022-02-10 DIAGNOSIS — D631 Anemia in chronic kidney disease: Secondary | ICD-10-CM | POA: Diagnosis not present

## 2022-02-10 DIAGNOSIS — M1030 Gout due to renal impairment, unspecified site: Secondary | ICD-10-CM | POA: Diagnosis not present

## 2022-02-11 ENCOUNTER — Other Ambulatory Visit (HOSPITAL_COMMUNITY): Payer: Self-pay | Admitting: *Deleted

## 2022-02-11 DIAGNOSIS — Z0000 Encounter for general adult medical examination without abnormal findings: Secondary | ICD-10-CM | POA: Diagnosis not present

## 2022-02-11 DIAGNOSIS — I129 Hypertensive chronic kidney disease with stage 1 through stage 4 chronic kidney disease, or unspecified chronic kidney disease: Secondary | ICD-10-CM | POA: Diagnosis not present

## 2022-02-11 DIAGNOSIS — N184 Chronic kidney disease, stage 4 (severe): Secondary | ICD-10-CM | POA: Diagnosis not present

## 2022-02-11 DIAGNOSIS — E7800 Pure hypercholesterolemia, unspecified: Secondary | ICD-10-CM | POA: Diagnosis not present

## 2022-02-11 DIAGNOSIS — M159 Polyosteoarthritis, unspecified: Secondary | ICD-10-CM | POA: Diagnosis not present

## 2022-02-11 DIAGNOSIS — Z23 Encounter for immunization: Secondary | ICD-10-CM | POA: Diagnosis not present

## 2022-02-11 DIAGNOSIS — D638 Anemia in other chronic diseases classified elsewhere: Secondary | ICD-10-CM | POA: Diagnosis not present

## 2022-02-12 ENCOUNTER — Encounter (HOSPITAL_COMMUNITY)
Admission: RE | Admit: 2022-02-12 | Discharge: 2022-02-12 | Disposition: A | Discharge status: Home / Self Care | Payer: Medicare Other | Source: Ambulatory Visit | Attending: Nephrology | Admitting: Nephrology

## 2022-02-12 DIAGNOSIS — D631 Anemia in chronic kidney disease: Secondary | ICD-10-CM | POA: Diagnosis not present

## 2022-02-12 DIAGNOSIS — M1030 Gout due to renal impairment, unspecified site: Secondary | ICD-10-CM | POA: Diagnosis not present

## 2022-02-12 DIAGNOSIS — Z862 Personal history of diseases of the blood and blood-forming organs and certain disorders involving the immune mechanism: Secondary | ICD-10-CM | POA: Diagnosis not present

## 2022-02-12 DIAGNOSIS — T84020D Dislocation of internal right hip prosthesis, subsequent encounter: Secondary | ICD-10-CM | POA: Diagnosis not present

## 2022-02-12 DIAGNOSIS — I129 Hypertensive chronic kidney disease with stage 1 through stage 4 chronic kidney disease, or unspecified chronic kidney disease: Secondary | ICD-10-CM | POA: Diagnosis not present

## 2022-02-12 DIAGNOSIS — N179 Acute kidney failure, unspecified: Secondary | ICD-10-CM | POA: Diagnosis not present

## 2022-02-12 DIAGNOSIS — N184 Chronic kidney disease, stage 4 (severe): Secondary | ICD-10-CM | POA: Insufficient documentation

## 2022-02-12 DIAGNOSIS — N189 Chronic kidney disease, unspecified: Secondary | ICD-10-CM | POA: Insufficient documentation

## 2022-02-12 MED ORDER — SODIUM CHLORIDE 0.9 % IV SOLN
510.0000 mg | INTRAVENOUS | Status: DC
  Administered 2022-02-12: 510 mg via INTRAVENOUS
  Filled 2022-02-12: qty 510

## 2022-02-17 DIAGNOSIS — N184 Chronic kidney disease, stage 4 (severe): Secondary | ICD-10-CM | POA: Diagnosis not present

## 2022-02-17 DIAGNOSIS — N179 Acute kidney failure, unspecified: Secondary | ICD-10-CM | POA: Diagnosis not present

## 2022-02-17 DIAGNOSIS — T84020D Dislocation of internal right hip prosthesis, subsequent encounter: Secondary | ICD-10-CM | POA: Diagnosis not present

## 2022-02-17 DIAGNOSIS — D631 Anemia in chronic kidney disease: Secondary | ICD-10-CM | POA: Diagnosis not present

## 2022-02-17 DIAGNOSIS — M1030 Gout due to renal impairment, unspecified site: Secondary | ICD-10-CM | POA: Diagnosis not present

## 2022-02-17 DIAGNOSIS — I129 Hypertensive chronic kidney disease with stage 1 through stage 4 chronic kidney disease, or unspecified chronic kidney disease: Secondary | ICD-10-CM | POA: Diagnosis not present

## 2022-02-19 ENCOUNTER — Encounter (HOSPITAL_COMMUNITY)
Admission: RE | Admit: 2022-02-19 | Discharge: 2022-02-19 | Disposition: A | Discharge status: Home / Self Care | Payer: Medicare Other | Source: Ambulatory Visit | Attending: Nephrology | Admitting: Nephrology

## 2022-02-19 DIAGNOSIS — N189 Chronic kidney disease, unspecified: Secondary | ICD-10-CM | POA: Diagnosis not present

## 2022-02-19 DIAGNOSIS — N184 Chronic kidney disease, stage 4 (severe): Secondary | ICD-10-CM | POA: Diagnosis not present

## 2022-02-19 DIAGNOSIS — Z862 Personal history of diseases of the blood and blood-forming organs and certain disorders involving the immune mechanism: Secondary | ICD-10-CM | POA: Diagnosis not present

## 2022-02-19 LAB — POCT HEMOGLOBIN-HEMACUE: Hemoglobin: 9.2 g/dL — ABNORMAL LOW (ref 13.0–17.0)

## 2022-02-19 MED ORDER — SODIUM CHLORIDE 0.9 % IV SOLN
510.0000 mg | INTRAVENOUS | Status: DC
  Administered 2022-02-19: 510 mg via INTRAVENOUS
  Filled 2022-02-19: qty 510

## 2022-02-23 DIAGNOSIS — D631 Anemia in chronic kidney disease: Secondary | ICD-10-CM | POA: Diagnosis not present

## 2022-02-23 DIAGNOSIS — I129 Hypertensive chronic kidney disease with stage 1 through stage 4 chronic kidney disease, or unspecified chronic kidney disease: Secondary | ICD-10-CM | POA: Diagnosis not present

## 2022-02-23 DIAGNOSIS — N179 Acute kidney failure, unspecified: Secondary | ICD-10-CM | POA: Diagnosis not present

## 2022-02-23 DIAGNOSIS — M1030 Gout due to renal impairment, unspecified site: Secondary | ICD-10-CM | POA: Diagnosis not present

## 2022-02-23 DIAGNOSIS — T84020D Dislocation of internal right hip prosthesis, subsequent encounter: Secondary | ICD-10-CM | POA: Diagnosis not present

## 2022-02-23 DIAGNOSIS — N184 Chronic kidney disease, stage 4 (severe): Secondary | ICD-10-CM | POA: Diagnosis not present

## 2022-02-24 ENCOUNTER — Encounter (HOSPITAL_COMMUNITY): Payer: Medicare Other

## 2022-03-02 DIAGNOSIS — N184 Chronic kidney disease, stage 4 (severe): Secondary | ICD-10-CM | POA: Diagnosis not present

## 2022-03-02 DIAGNOSIS — M1030 Gout due to renal impairment, unspecified site: Secondary | ICD-10-CM | POA: Diagnosis not present

## 2022-03-02 DIAGNOSIS — T84020D Dislocation of internal right hip prosthesis, subsequent encounter: Secondary | ICD-10-CM | POA: Diagnosis not present

## 2022-03-02 DIAGNOSIS — I129 Hypertensive chronic kidney disease with stage 1 through stage 4 chronic kidney disease, or unspecified chronic kidney disease: Secondary | ICD-10-CM | POA: Diagnosis not present

## 2022-03-02 DIAGNOSIS — D631 Anemia in chronic kidney disease: Secondary | ICD-10-CM | POA: Diagnosis not present

## 2022-03-02 DIAGNOSIS — N179 Acute kidney failure, unspecified: Secondary | ICD-10-CM | POA: Diagnosis not present

## 2022-03-03 ENCOUNTER — Encounter (HOSPITAL_COMMUNITY): Payer: Medicare Other

## 2022-03-03 ENCOUNTER — Encounter (HOSPITAL_COMMUNITY)
Admission: RE | Admit: 2022-03-03 | Discharge: 2022-03-03 | Disposition: A | Discharge status: Home / Self Care | Payer: Medicare Other | Source: Ambulatory Visit | Attending: Nephrology | Admitting: Nephrology

## 2022-03-03 VITALS — BP 160/51 | HR 59 | Temp 97.4°F | Resp 18

## 2022-03-03 DIAGNOSIS — Z862 Personal history of diseases of the blood and blood-forming organs and certain disorders involving the immune mechanism: Secondary | ICD-10-CM | POA: Diagnosis not present

## 2022-03-03 DIAGNOSIS — N184 Chronic kidney disease, stage 4 (severe): Secondary | ICD-10-CM | POA: Diagnosis not present

## 2022-03-03 DIAGNOSIS — N189 Chronic kidney disease, unspecified: Secondary | ICD-10-CM

## 2022-03-03 LAB — POCT HEMOGLOBIN-HEMACUE: Hemoglobin: 8.7 g/dL — ABNORMAL LOW (ref 13.0–17.0)

## 2022-03-03 MED ORDER — EPOETIN ALFA-EPBX 10000 UNIT/ML IJ SOLN
INTRAMUSCULAR | Status: AC
  Administered 2022-03-03: 20000 [IU] via SUBCUTANEOUS
  Filled 2022-03-03: qty 2

## 2022-03-03 MED ORDER — EPOETIN ALFA-EPBX 10000 UNIT/ML IJ SOLN: 20000.0000 [IU] | INTRAMUSCULAR | Status: DC

## 2022-03-06 DIAGNOSIS — R627 Adult failure to thrive: Secondary | ICD-10-CM | POA: Diagnosis not present

## 2022-03-06 DIAGNOSIS — T84020A Dislocation of internal right hip prosthesis, initial encounter: Secondary | ICD-10-CM | POA: Diagnosis not present

## 2022-03-06 DIAGNOSIS — N179 Acute kidney failure, unspecified: Secondary | ICD-10-CM | POA: Diagnosis not present

## 2022-03-07 DIAGNOSIS — T84020A Dislocation of internal right hip prosthesis, initial encounter: Secondary | ICD-10-CM | POA: Diagnosis not present

## 2022-03-07 DIAGNOSIS — N179 Acute kidney failure, unspecified: Secondary | ICD-10-CM | POA: Diagnosis not present

## 2022-03-07 DIAGNOSIS — R627 Adult failure to thrive: Secondary | ICD-10-CM | POA: Diagnosis not present

## 2022-03-31 ENCOUNTER — Ambulatory Visit (HOSPITAL_COMMUNITY)
Admission: RE | Admit: 2022-03-31 | Discharge: 2022-03-31 | Disposition: A | Discharge status: Home / Self Care | Payer: Medicare Other | Source: Ambulatory Visit | Attending: Nephrology | Admitting: Nephrology

## 2022-03-31 VITALS — BP 161/50 | HR 62 | Temp 97.1°F | Resp 18

## 2022-03-31 DIAGNOSIS — Z862 Personal history of diseases of the blood and blood-forming organs and certain disorders involving the immune mechanism: Secondary | ICD-10-CM | POA: Diagnosis not present

## 2022-03-31 DIAGNOSIS — N184 Chronic kidney disease, stage 4 (severe): Secondary | ICD-10-CM | POA: Insufficient documentation

## 2022-03-31 DIAGNOSIS — N189 Chronic kidney disease, unspecified: Secondary | ICD-10-CM | POA: Diagnosis not present

## 2022-03-31 LAB — FERRITIN: Ferritin: 679 ng/mL — ABNORMAL HIGH (ref 24–336)

## 2022-03-31 LAB — IRON AND TIBC
Iron: 32 ug/dL — ABNORMAL LOW (ref 45–182)
Saturation Ratios: 20 % (ref 17.9–39.5)
TIBC: 160 ug/dL — ABNORMAL LOW (ref 250–450)
UIBC: 128 ug/dL

## 2022-03-31 LAB — RENAL FUNCTION PANEL
Albumin: 3.2 g/dL — ABNORMAL LOW (ref 3.5–5.0)
Anion gap: 11 (ref 5–15)
BUN: 38 mg/dL — ABNORMAL HIGH (ref 8–23)
CO2: 20 mmol/L — ABNORMAL LOW (ref 22–32)
Calcium: 9.8 mg/dL (ref 8.9–10.3)
Chloride: 107 mmol/L (ref 98–111)
Creatinine, Ser: 2.42 mg/dL — ABNORMAL HIGH (ref 0.61–1.24)
GFR, Estimated: 24 mL/min — ABNORMAL LOW (ref >60)
Glucose, Bld: 123 mg/dL — ABNORMAL HIGH (ref 70–99)
Phosphorus: 4.2 mg/dL (ref 2.5–4.6)
Potassium: 3.3 mmol/L — ABNORMAL LOW (ref 3.5–5.1)
Sodium: 138 mmol/L (ref 135–145)

## 2022-03-31 LAB — POCT HEMOGLOBIN-HEMACUE: Hemoglobin: 9.4 g/dL — ABNORMAL LOW (ref 13.0–17.0)

## 2022-03-31 MED ORDER — EPOETIN ALFA-EPBX 10000 UNIT/ML IJ SOLN
20000.0000 [IU] | INTRAMUSCULAR | Status: DC
  Administered 2022-03-31: 20000 [IU] via SUBCUTANEOUS

## 2022-03-31 MED ORDER — EPOETIN ALFA-EPBX 10000 UNIT/ML IJ SOLN
INTRAMUSCULAR | Status: AC
  Filled 2022-03-31: qty 2

## 2022-04-05 DIAGNOSIS — T84020A Dislocation of internal right hip prosthesis, initial encounter: Secondary | ICD-10-CM | POA: Diagnosis not present

## 2022-04-05 DIAGNOSIS — N179 Acute kidney failure, unspecified: Secondary | ICD-10-CM | POA: Diagnosis not present

## 2022-04-05 DIAGNOSIS — R627 Adult failure to thrive: Secondary | ICD-10-CM | POA: Diagnosis not present

## 2022-04-07 DIAGNOSIS — R627 Adult failure to thrive: Secondary | ICD-10-CM | POA: Diagnosis not present

## 2022-04-07 DIAGNOSIS — N179 Acute kidney failure, unspecified: Secondary | ICD-10-CM | POA: Diagnosis not present

## 2022-04-07 DIAGNOSIS — T84020A Dislocation of internal right hip prosthesis, initial encounter: Secondary | ICD-10-CM | POA: Diagnosis not present

## 2022-04-28 ENCOUNTER — Ambulatory Visit (HOSPITAL_COMMUNITY)
Admission: RE | Admit: 2022-04-28 | Discharge: 2022-04-28 | Disposition: A | Discharge status: Home / Self Care | Payer: Medicare Other | Source: Ambulatory Visit | Attending: Nephrology | Admitting: Nephrology

## 2022-04-28 VITALS — BP 145/59 | HR 57 | Temp 98.3°F | Resp 17

## 2022-04-28 DIAGNOSIS — N184 Chronic kidney disease, stage 4 (severe): Secondary | ICD-10-CM | POA: Insufficient documentation

## 2022-04-28 DIAGNOSIS — Z862 Personal history of diseases of the blood and blood-forming organs and certain disorders involving the immune mechanism: Secondary | ICD-10-CM | POA: Diagnosis not present

## 2022-04-28 DIAGNOSIS — N189 Chronic kidney disease, unspecified: Secondary | ICD-10-CM | POA: Diagnosis not present

## 2022-04-28 LAB — POCT HEMOGLOBIN-HEMACUE: Hemoglobin: 9.3 g/dL — ABNORMAL LOW (ref 13.0–17.0)

## 2022-04-28 MED ORDER — EPOETIN ALFA-EPBX 10000 UNIT/ML IJ SOLN
20000.0000 [IU] | INTRAMUSCULAR | Status: DC
  Administered 2022-04-28: 20000 [IU] via SUBCUTANEOUS

## 2022-04-28 MED ORDER — EPOETIN ALFA-EPBX 10000 UNIT/ML IJ SOLN
INTRAMUSCULAR | Status: AC
  Filled 2022-04-28: qty 2

## 2022-05-06 DIAGNOSIS — R627 Adult failure to thrive: Secondary | ICD-10-CM | POA: Diagnosis not present

## 2022-05-06 DIAGNOSIS — T84020A Dislocation of internal right hip prosthesis, initial encounter: Secondary | ICD-10-CM | POA: Diagnosis not present

## 2022-05-06 DIAGNOSIS — N179 Acute kidney failure, unspecified: Secondary | ICD-10-CM | POA: Diagnosis not present

## 2022-05-11 DIAGNOSIS — N184 Chronic kidney disease, stage 4 (severe): Secondary | ICD-10-CM | POA: Diagnosis not present

## 2022-05-11 DIAGNOSIS — I129 Hypertensive chronic kidney disease with stage 1 through stage 4 chronic kidney disease, or unspecified chronic kidney disease: Secondary | ICD-10-CM | POA: Diagnosis not present

## 2022-05-11 DIAGNOSIS — E871 Hypo-osmolality and hyponatremia: Secondary | ICD-10-CM | POA: Diagnosis not present

## 2022-05-11 DIAGNOSIS — D631 Anemia in chronic kidney disease: Secondary | ICD-10-CM | POA: Diagnosis not present

## 2022-05-11 DIAGNOSIS — N2581 Secondary hyperparathyroidism of renal origin: Secondary | ICD-10-CM | POA: Diagnosis not present

## 2022-05-26 ENCOUNTER — Ambulatory Visit (HOSPITAL_COMMUNITY)
Admission: RE | Admit: 2022-05-26 | Discharge: 2022-05-26 | Disposition: A | Discharge status: Home / Self Care | Payer: Medicare Other | Source: Ambulatory Visit | Attending: Nephrology | Admitting: Nephrology

## 2022-05-26 VITALS — BP 158/50 | HR 67 | Temp 97.3°F | Resp 17

## 2022-05-26 DIAGNOSIS — Z862 Personal history of diseases of the blood and blood-forming organs and certain disorders involving the immune mechanism: Secondary | ICD-10-CM | POA: Diagnosis not present

## 2022-05-26 DIAGNOSIS — N184 Chronic kidney disease, stage 4 (severe): Secondary | ICD-10-CM | POA: Diagnosis not present

## 2022-05-26 DIAGNOSIS — N189 Chronic kidney disease, unspecified: Secondary | ICD-10-CM | POA: Insufficient documentation

## 2022-05-26 LAB — RENAL FUNCTION PANEL
Albumin: 3.3 g/dL — ABNORMAL LOW (ref 3.5–5.0)
Anion gap: 12 (ref 5–15)
BUN: 44 mg/dL — ABNORMAL HIGH (ref 8–23)
CO2: 22 mmol/L (ref 22–32)
Calcium: 10 mg/dL (ref 8.9–10.3)
Chloride: 101 mmol/L (ref 98–111)
Creatinine, Ser: 2.56 mg/dL — ABNORMAL HIGH (ref 0.61–1.24)
GFR, Estimated: 23 mL/min — ABNORMAL LOW (ref >60)
Glucose, Bld: 118 mg/dL — ABNORMAL HIGH (ref 70–99)
Phosphorus: 3.7 mg/dL (ref 2.5–4.6)
Potassium: 3.2 mmol/L — ABNORMAL LOW (ref 3.5–5.1)
Sodium: 135 mmol/L (ref 135–145)

## 2022-05-26 LAB — IRON AND TIBC
Iron: 33 ug/dL — ABNORMAL LOW (ref 45–182)
Saturation Ratios: 19 % (ref 17.9–39.5)
TIBC: 171 ug/dL — ABNORMAL LOW (ref 250–450)
UIBC: 138 ug/dL

## 2022-05-26 LAB — POCT HEMOGLOBIN-HEMACUE: Hemoglobin: 9.3 g/dL — ABNORMAL LOW (ref 13.0–17.0)

## 2022-05-26 LAB — FERRITIN: Ferritin: 781 ng/mL — ABNORMAL HIGH (ref 24–336)

## 2022-05-26 MED ORDER — EPOETIN ALFA-EPBX 10000 UNIT/ML IJ SOLN
INTRAMUSCULAR | Status: AC
  Filled 2022-05-26: qty 2

## 2022-05-26 MED ORDER — EPOETIN ALFA-EPBX 10000 UNIT/ML IJ SOLN
20000.0000 [IU] | INTRAMUSCULAR | Status: DC
  Administered 2022-05-26: 20000 [IU] via SUBCUTANEOUS

## 2022-05-27 LAB — PTH, INTACT AND CALCIUM
Calcium, Total (PTH): 10 mg/dL (ref 8.6–10.2)
PTH: 220 pg/mL — ABNORMAL HIGH (ref 15–65)

## 2022-06-23 ENCOUNTER — Ambulatory Visit (HOSPITAL_COMMUNITY)
Admission: RE | Admit: 2022-06-23 | Discharge: 2022-06-23 | Disposition: A | Discharge status: Home / Self Care | Payer: Medicare Other | Source: Ambulatory Visit | Attending: Nephrology | Admitting: Nephrology

## 2022-06-23 VITALS — BP 163/53 | HR 64 | Temp 97.7°F | Resp 17

## 2022-06-23 DIAGNOSIS — N189 Chronic kidney disease, unspecified: Secondary | ICD-10-CM | POA: Insufficient documentation

## 2022-06-23 DIAGNOSIS — N184 Chronic kidney disease, stage 4 (severe): Secondary | ICD-10-CM

## 2022-06-23 DIAGNOSIS — Z862 Personal history of diseases of the blood and blood-forming organs and certain disorders involving the immune mechanism: Secondary | ICD-10-CM

## 2022-06-23 LAB — POCT HEMOGLOBIN-HEMACUE: Hemoglobin: 9.6 g/dL — ABNORMAL LOW (ref 13.0–17.0)

## 2022-06-23 MED ORDER — EPOETIN ALFA-EPBX 10000 UNIT/ML IJ SOLN
INTRAMUSCULAR | Status: AC
  Filled 2022-06-23: qty 2

## 2022-06-23 MED ORDER — EPOETIN ALFA-EPBX 10000 UNIT/ML IJ SOLN
20000.0000 [IU] | INTRAMUSCULAR | Status: DC
  Administered 2022-06-23: 20000 [IU] via SUBCUTANEOUS

## 2022-07-21 ENCOUNTER — Ambulatory Visit (HOSPITAL_COMMUNITY)
Admission: RE | Admit: 2022-07-21 | Discharge: 2022-07-21 | Disposition: A | Discharge status: Home / Self Care | Payer: Medicare Other | Source: Ambulatory Visit | Attending: Nephrology | Admitting: Nephrology

## 2022-07-21 VITALS — BP 160/50 | HR 63 | Temp 97.8°F | Resp 18

## 2022-07-21 DIAGNOSIS — Z862 Personal history of diseases of the blood and blood-forming organs and certain disorders involving the immune mechanism: Secondary | ICD-10-CM | POA: Diagnosis not present

## 2022-07-21 DIAGNOSIS — N189 Chronic kidney disease, unspecified: Secondary | ICD-10-CM | POA: Diagnosis not present

## 2022-07-21 DIAGNOSIS — N184 Chronic kidney disease, stage 4 (severe): Secondary | ICD-10-CM | POA: Insufficient documentation

## 2022-07-21 LAB — RENAL FUNCTION PANEL
Albumin: 3.3 g/dL — ABNORMAL LOW (ref 3.5–5.0)
Anion gap: 10 (ref 5–15)
BUN: 57 mg/dL — ABNORMAL HIGH (ref 8–23)
CO2: 23 mmol/L (ref 22–32)
Calcium: 10.2 mg/dL (ref 8.9–10.3)
Chloride: 103 mmol/L (ref 98–111)
Creatinine, Ser: 2.69 mg/dL — ABNORMAL HIGH (ref 0.61–1.24)
GFR, Estimated: 21 mL/min — ABNORMAL LOW (ref >60)
Glucose, Bld: 105 mg/dL — ABNORMAL HIGH (ref 70–99)
Phosphorus: 3.8 mg/dL (ref 2.5–4.6)
Potassium: 4 mmol/L (ref 3.5–5.1)
Sodium: 136 mmol/L (ref 135–145)

## 2022-07-21 LAB — POCT HEMOGLOBIN-HEMACUE: Hemoglobin: 8.9 g/dL — ABNORMAL LOW (ref 13.0–17.0)

## 2022-07-21 LAB — IRON AND TIBC
Iron: 48 ug/dL (ref 45–182)
Saturation Ratios: 23 % (ref 17.9–39.5)
TIBC: 207 ug/dL — ABNORMAL LOW (ref 250–450)
UIBC: 159 ug/dL

## 2022-07-21 LAB — FERRITIN: Ferritin: 629 ng/mL — ABNORMAL HIGH (ref 24–336)

## 2022-07-21 MED ORDER — EPOETIN ALFA-EPBX 10000 UNIT/ML IJ SOLN: 20000.0000 [IU] | INTRAMUSCULAR | Status: DC

## 2022-07-21 MED ORDER — EPOETIN ALFA-EPBX 10000 UNIT/ML IJ SOLN
INTRAMUSCULAR | Status: AC
  Administered 2022-07-21: 20000 [IU] via SUBCUTANEOUS
  Filled 2022-07-21: qty 2

## 2022-07-29 DIAGNOSIS — H401133 Primary open-angle glaucoma, bilateral, severe stage: Secondary | ICD-10-CM | POA: Diagnosis not present

## 2022-08-18 ENCOUNTER — Other Ambulatory Visit (HOSPITAL_COMMUNITY): Payer: Self-pay | Admitting: *Deleted

## 2022-08-18 ENCOUNTER — Ambulatory Visit (HOSPITAL_COMMUNITY)
Admission: RE | Admit: 2022-08-18 | Discharge: 2022-08-18 | Disposition: A | Discharge status: Home / Self Care | Payer: Medicare Other | Source: Ambulatory Visit | Attending: Nephrology | Admitting: Nephrology

## 2022-08-18 VITALS — BP 156/45 | HR 59 | Temp 97.3°F | Resp 17

## 2022-08-18 DIAGNOSIS — Z862 Personal history of diseases of the blood and blood-forming organs and certain disorders involving the immune mechanism: Secondary | ICD-10-CM | POA: Diagnosis not present

## 2022-08-18 DIAGNOSIS — N184 Chronic kidney disease, stage 4 (severe): Secondary | ICD-10-CM

## 2022-08-18 DIAGNOSIS — N189 Chronic kidney disease, unspecified: Secondary | ICD-10-CM | POA: Diagnosis not present

## 2022-08-18 LAB — POCT HEMOGLOBIN-HEMACUE: Hemoglobin: 9.2 g/dL — ABNORMAL LOW (ref 13.0–17.0)

## 2022-08-18 MED ORDER — EPOETIN ALFA-EPBX 40000 UNIT/ML IJ SOLN
INTRAMUSCULAR | Status: AC
  Administered 2022-08-18: 30000 [IU] via SUBCUTANEOUS
  Filled 2022-08-18: qty 1

## 2022-08-18 MED ORDER — EPOETIN ALFA-EPBX 40000 UNIT/ML IJ SOLN: 30000.0000 [IU] | INTRAMUSCULAR | Status: DC

## 2022-08-19 DIAGNOSIS — D638 Anemia in other chronic diseases classified elsewhere: Secondary | ICD-10-CM | POA: Diagnosis not present

## 2022-08-19 DIAGNOSIS — M159 Polyosteoarthritis, unspecified: Secondary | ICD-10-CM | POA: Diagnosis not present

## 2022-08-19 DIAGNOSIS — E7800 Pure hypercholesterolemia, unspecified: Secondary | ICD-10-CM | POA: Diagnosis not present

## 2022-08-19 DIAGNOSIS — N184 Chronic kidney disease, stage 4 (severe): Secondary | ICD-10-CM | POA: Diagnosis not present

## 2022-08-19 DIAGNOSIS — I129 Hypertensive chronic kidney disease with stage 1 through stage 4 chronic kidney disease, or unspecified chronic kidney disease: Secondary | ICD-10-CM | POA: Diagnosis not present

## 2022-08-26 ENCOUNTER — Encounter (HOSPITAL_COMMUNITY)
Admission: RE | Admit: 2022-08-26 | Discharge: 2022-08-26 | Disposition: A | Discharge status: Home / Self Care | Payer: Medicare Other | Source: Ambulatory Visit | Attending: Nephrology | Admitting: Nephrology

## 2022-08-26 DIAGNOSIS — N189 Chronic kidney disease, unspecified: Secondary | ICD-10-CM | POA: Insufficient documentation

## 2022-08-26 DIAGNOSIS — D631 Anemia in chronic kidney disease: Secondary | ICD-10-CM | POA: Insufficient documentation

## 2022-08-26 MED ORDER — SODIUM CHLORIDE 0.9 % IV SOLN
510.0000 mg | INTRAVENOUS | Status: DC
  Administered 2022-08-26: 510 mg via INTRAVENOUS
  Filled 2022-08-26: qty 510

## 2022-09-09 NOTE — Progress Notes (Signed)
[facility] - Clinic Note  09/13/2022     CHIEF COMPLAINT Patient presents for Retina Follow Up    HISTORY OF PRESENT ILLNESS: Danny Rhodes is a 89 y.o. male who presents to the clinic today for:  HPI     Retina Follow Up   Patient presents with  Other.  In left eye.  This started 10 months ago.  I, the attending physician,  performed the HPI with the patient and updated documentation appropriately.        Comments   Patient here for 10 months retina follow up for Choroidal neovascular membrane OS. Patient states vision about the same. No eye pain.       Last edited by Rennis Chris, MD on 09/13/2022 11:33 AM.     Pt states he had hip sx in August, no new eye problems  Referring physician: Pearson Grippe, Md [address] [place],  [place] [address]  HISTORICAL INFORMATION:   Selected notes from the MEDICAL RECORD NUMBER Referred by Dr. Alben Spittle LEE: 07.30.21 BCVA OD 20/40, OS 20/50 Ocular Hx- POAG OU severe, Cataracts OU, HTN Retinopathy OU, NVAMD OS, Band Keratopathy OU   CURRENT MEDICATIONS: Current Outpatient Medications (Ophthalmic Drugs)  Medication Sig   dorzolamide (TRUSOPT) 2 % ophthalmic solution Place 1 drop into both eyes 2 (two) times daily.   latanoprost (XALATAN) 0.005 % ophthalmic solution Place 1 drop into both eyes at bedtime.   No current facility-administered medications for this visit. (Ophthalmic Drugs)   Current Outpatient Medications (Other)  Medication Sig   acetaminophen (TYLENOL) 500 MG tablet Take 1,000 mg by mouth 2 (two) times daily as needed for headache (pain).   amLODipine (NORVASC) 10 MG tablet 1 tablet   aspirin 81 MG chewable tablet Chew 1 tablet (81 mg total) by mouth 2 (two) times daily.   atorvastatin (LIPITOR) 20 MG tablet Take 20 mg by mouth every morning.   carvedilol (COREG) 3.125 MG tablet Take 3.125 mg by mouth 2 (two) times daily with a meal.   methocarbamol (ROBAXIN) 500 MG tablet Take 1  tablet (500 mg total) by mouth every 6 (six) hours as needed for muscle spasms.   mirtazapine (REMERON) 15 MG tablet Take 15 mg by mouth at bedtime.   oxyCODONE (OXY IR/ROXICODONE) 5 MG immediate release tablet Take 1 tablet (5 mg total) by mouth every 4 (four) hours as needed for moderate pain.   pantoprazole (PROTONIX) 40 MG tablet Take 1 tablet (40 mg total) by mouth daily at 6 (six) AM.   allopurinol (ZYLOPRIM) 100 MG tablet Take 100 mg by mouth every morning. (Patient not taking: Reported on 12/28/2021)   No current facility-administered medications for this visit. (Other)   REVIEW OF SYSTEMS: ROS   Positive for: Genitourinary, Eyes Negative for: Constitutional, Gastrointestinal, Neurological, Skin, Musculoskeletal, HENT, Endocrine, Cardiovascular, Respiratory, Psychiatric, Allergic/Imm, Heme/Lymph Last edited by Laddie Aquas, COA on 09/13/2022  9:03 AM.      ALLERGIES No Known Allergies  PAST MEDICAL HISTORY Past Medical History:  Diagnosis Date   CKD (chronic kidney disease), symptom management only, stage 4 (severe)    Glaucoma    POAG OU   History of anemia due to chronic kidney disease    Hypertensive retinopathy    OU   Past Surgical History:  Procedure Laterality Date   CATARACT EXTRACTION Bilateral    EYE SURGERY Bilateral    Cat Sx   Hip replacement Right    20 yrs  ago   TOTAL HIP ARTHROPLASTY Right 12/01/2021   Procedure: EXCISIONAL TOTAL HIP ARTHROPLASTY WITH POSTERIOR APPROACH;  Surgeon: Durene Romanslin, Matthew, MD;  [facility];  Service: Orthopedics;  Laterality: Right;   FAMILY HISTORY History reviewed. No pertinent family history.  SOCIAL HISTORY Social History   Tobacco Use   Smoking status: Never   Smokeless tobacco: Never  Vaping Use   Vaping Use: Never used  Substance Use Topics   Alcohol use: Not Currently       OPHTHALMIC EXAM:  Base Eye Exam     Visual Acuity (Snellen - Linear)       Right Left   Dist [place] 20/25 -2 20/25 -1          Tonometry (Tonopen, 9:00 AM)       Right Left   Pressure 12 07         Pupils       Dark Light Shape React APD   Right 3 2 Round Brisk None   Left 3 2 Round Brisk None         Visual Fields (Counting fingers)       Left Right    Full Full         Extraocular Movement       Right Left    Full, Ortho Full, Ortho         Neuro/Psych     Oriented x3: Yes   Mood/Affect: Normal         Dilation     Both eyes: 1.0% Mydriacyl, 2.5% Phenylephrine @ 9:00 AM           Slit Lamp and Fundus Exam     Slit Lamp Exam       Right Left   Lids/Lashes Dermatochalasis - upper lid Dermatochalasis - upper lid   Conjunctiva/Sclera Melanosis Mild melanosis, temporal pinguecula   Cornea arcus, well healed cataract wound arcus, trace Punctate epithelial erosions, early band K nasally, well healed temporal cataract wounds, mild central haze, tear film debris   Anterior Chamber deep and clear, narrow temporal angle deep and narrow temporal angle   Iris Round and dilated Round and dilated   Lens PCIOL PC IOL in good position   Anterior Vitreous Vitreous syneresis Vitreous syneresis         Fundus Exam       Right Left   Disc 2+Pallor, +cupping, +PPA/PPP, thin superior rim, Sharp rim 2+Pallor, +cupping, scattered PPP, thin inferior rim   C/D Ratio 0.7 0.75   Macula Flat, Blunted foveal reflex, RPE mottling, clumping and atrophy, Drusen, No heme or edema Blunted foveal reflex, focal pigment ring/CNV with stable improvement in shallow SRF IT to fovea, RPE mottling, clumping and atrophy, no heme   Vessels attenuated, Tortuous, AV crossing changes attenuated, mild tortuosity, mild AV crossing changes   Periphery Attached, No heme, reticular degeneration Attached, focal perivascular pigment clumping temporally, No heme           Refraction     Wearing Rx       Sphere   Right None   Left None           IMAGING AND PROCEDURES  Imaging and Procedures for  09/13/2022  OCT, Retina - OU - Both Eyes       Right Eye Quality was good. Central Foveal Thickness: 216. Progression has been stable. Findings include normal foveal contour, no IRF, no SRF, retinal drusen (Trace ERM; partial PVD).   Left Eye  Quality was good. Central Foveal Thickness: 203. Progression has been stable. Findings include normal foveal contour, no IRF, no SRF, retinal drusen , pigment epithelial detachment, outer retinal atrophy (stable resolution of IRF/SRF overlying PED).   Notes *Images captured and stored on drive  Diagnosis / Impression:  OD: NFP, no IRF/SRF OS: stable resolution of IRF/SRF overlying PED  Clinical management:  See below  Abbreviations: NFP - Normal foveal profile. CME - cystoid macular edema. PED - pigment epithelial detachment. IRF - intraretinal fluid. SRF - subretinal fluid. EZ - ellipsoid zone. ERM - epiretinal membrane. ORA - outer retinal atrophy. ORT - outer retinal tubulation. SRHM - subretinal hyper-reflective material. IRHM - intraretinal hyper-reflective material            ASSESSMENT/PLAN:    ICD-10-CM   1. Choroidal neovascular membrane of left eye  H35.052 OCT, Retina - OU - Both Eyes    2. Exudative age-related macular degeneration of left eye with active choroidal neovascularization  H35.3221     3. Essential hypertension  I10     4. Hypertensive retinopathy of both eyes  H35.033     5. Pseudophakia, both eyes  Z96.1     6. Primary open angle glaucoma (POAG) of both eyes, severe stage  H40.1133       1,2. Choroidal neovascular membrane w/ +SRF  - idiopathic CNV vs Exudative age related macular degeneration, OS  - interestingly, OD without drusen and OS with minimal drusen, which we would see in typical ARMD  - s/p IVA OS #1 (08.04.21), #2 (09.02.21), #3 (10.05.21), #4 (11.11.21), #5 (12.23.21), #6 (2.3.22), #7 (03.31.22), #8 (06.09.22)  - BCVA OS 20/25  - OCT shows stable resolution of IRF/SRF overlying PED at almost  2 years since last injxn  - recommend holding IVA OS again today -- pt in agreement  - f/u in 9-12 months, sooner prn -- DFE/OCT  3,4. Hypertensive retinopathy OU - discussed importance of tight BP control - monitor  5. Pseudophakia OU  - s/p CE/IOL (Weaver, OD 8.11.21; OS: 09.22.21)  - IOL's in good position, doing well  6. POAG OU  - under the expert management of Dr. Zenaida NieceVan  - IOP 12,07 today  - on dorzolamide bid OU and latanoprost qhs OU per Dr Zenaida NieceVan  Ophthalmic Meds Ordered this visit:  No orders of the defined types were placed in this encounter.    Return for f/u 9-12 months, CNV OS, DFE, OCT.  There are no Patient Instructions on file for this visit.   Explained the diagnoses, plan, and follow up with the patient and they expressed understanding.  Patient expressed understanding of the importance of proper follow up care.   This document serves as a record of services personally performed by Karie ChimeraBrian G. Mykiah Schmuck, MD, PhD. It was created on their behalf by Annalee Gentaaryl Barber, COMT. The creation of this record is the provider's dictation and/or activities during the visit.  Electronically signed by: Annalee Gentaaryl Barber, COMT 09/13/22 11:34 AM  This document serves as a record of services personally performed by Karie ChimeraBrian G. Limuel Nieblas, MD, PhD. It was created on their behalf by Glee ArvinAmanda J. Manson PasseyBrown, OA an ophthalmic technician. The creation of this record is the provider's dictation and/or activities during the visit.    Electronically signed by: Glee ArvinAmanda J. Manson PasseyBrown, [place] 04.08.2024 11:34 AM  Karie ChimeraBrian G. Danuta Huseman, M.D., Ph.D. Diseases & Surgery of the Retina and Vitreous [facility]  I have reviewed the above documentation for accuracy and  completeness, and I agree with the above. Karie Chimera, M.D., Ph.D. 09/13/22 11:37 AM   Abbreviations: M myopia (nearsighted); A astigmatism; H hyperopia (farsighted); P presbyopia; Mrx spectacle prescription;  CTL contact lenses; OD right eye; OS  left eye; OU both eyes  XT exotropia; ET esotropia; PEK punctate epithelial keratitis; PEE punctate epithelial erosions; DES dry eye syndrome; MGD meibomian gland dysfunction; ATs artificial tears; PFAT's preservative free artificial tears; NSC nuclear sclerotic cataract; PSC posterior subcapsular cataract; ERM epi-retinal membrane; PVD posterior vitreous detachment; RD retinal detachment; DM diabetes mellitus; DR diabetic retinopathy; NPDR non-proliferative diabetic retinopathy; PDR proliferative diabetic retinopathy; CSME clinically significant macular edema; DME diabetic macular edema; dbh dot blot hemorrhages; CWS cotton wool spot; POAG primary open angle glaucoma; C/D cup-to-disc ratio; HVF humphrey visual field; GVF goldmann visual field; OCT optical coherence tomography; IOP intraocular pressure; BRVO Branch retinal vein occlusion; CRVO central retinal vein occlusion; CRAO central retinal artery occlusion; BRAO branch retinal artery occlusion; RT retinal tear; SB scleral buckle; PPV pars plana vitrectomy; VH Vitreous hemorrhage; PRP panretinal laser photocoagulation; IVK intravitreal kenalog; VMT vitreomacular traction; MH Macular hole;  NVD neovascularization of the disc; NVE neovascularization elsewhere; AREDS age related eye disease study; ARMD age related macular degeneration; POAG primary open angle glaucoma; EBMD epithelial/anterior basement membrane dystrophy; ACIOL anterior chamber intraocular lens; IOL intraocular lens; PCIOL posterior chamber intraocular lens; Phaco/IOL phacoemulsification with intraocular lens placement; PRK photorefractive keratectomy; LASIK laser assisted in situ keratomileusis; HTN hypertension; DM diabetes mellitus; COPD chronic obstructive pulmonary disease

## 2022-09-10 DIAGNOSIS — N184 Chronic kidney disease, stage 4 (severe): Secondary | ICD-10-CM | POA: Diagnosis not present

## 2022-09-10 DIAGNOSIS — I129 Hypertensive chronic kidney disease with stage 1 through stage 4 chronic kidney disease, or unspecified chronic kidney disease: Secondary | ICD-10-CM | POA: Diagnosis not present

## 2022-09-10 DIAGNOSIS — N2581 Secondary hyperparathyroidism of renal origin: Secondary | ICD-10-CM | POA: Diagnosis not present

## 2022-09-10 DIAGNOSIS — D631 Anemia in chronic kidney disease: Secondary | ICD-10-CM | POA: Diagnosis not present

## 2022-09-13 ENCOUNTER — Ambulatory Visit (INDEPENDENT_AMBULATORY_CARE_PROVIDER_SITE_OTHER): Payer: Medicare Other | Admitting: Ophthalmology

## 2022-09-13 ENCOUNTER — Encounter (INDEPENDENT_AMBULATORY_CARE_PROVIDER_SITE_OTHER): Payer: Self-pay | Admitting: Ophthalmology

## 2022-09-13 DIAGNOSIS — H353221 Exudative age-related macular degeneration, left eye, with active choroidal neovascularization: Secondary | ICD-10-CM | POA: Diagnosis not present

## 2022-09-13 DIAGNOSIS — I10 Essential (primary) hypertension: Secondary | ICD-10-CM | POA: Diagnosis not present

## 2022-09-13 DIAGNOSIS — H35033 Hypertensive retinopathy, bilateral: Secondary | ICD-10-CM

## 2022-09-13 DIAGNOSIS — H35052 Retinal neovascularization, unspecified, left eye: Secondary | ICD-10-CM

## 2022-09-13 DIAGNOSIS — Z961 Presence of intraocular lens: Secondary | ICD-10-CM

## 2022-09-13 DIAGNOSIS — H401133 Primary open-angle glaucoma, bilateral, severe stage: Secondary | ICD-10-CM

## 2022-09-15 ENCOUNTER — Encounter (INDEPENDENT_AMBULATORY_CARE_PROVIDER_SITE_OTHER): Payer: Medicare Other | Admitting: Ophthalmology

## 2022-09-15 ENCOUNTER — Encounter (HOSPITAL_COMMUNITY): Payer: Medicare Other

## 2022-09-16 ENCOUNTER — Ambulatory Visit (HOSPITAL_COMMUNITY)
Admission: RE | Admit: 2022-09-16 | Discharge: 2022-09-16 | Disposition: A | Discharge status: Home / Self Care | Payer: Medicare Other | Source: Ambulatory Visit | Attending: Nephrology | Admitting: Nephrology

## 2022-09-16 VITALS — BP 164/51 | HR 54 | Temp 98.3°F | Resp 17 | Wt 156.0 lb

## 2022-09-16 DIAGNOSIS — N184 Chronic kidney disease, stage 4 (severe): Secondary | ICD-10-CM | POA: Diagnosis not present

## 2022-09-16 DIAGNOSIS — N189 Chronic kidney disease, unspecified: Secondary | ICD-10-CM | POA: Diagnosis not present

## 2022-09-16 DIAGNOSIS — Z862 Personal history of diseases of the blood and blood-forming organs and certain disorders involving the immune mechanism: Secondary | ICD-10-CM | POA: Insufficient documentation

## 2022-09-16 LAB — POCT HEMOGLOBIN-HEMACUE: Hemoglobin: 9.7 g/dL — ABNORMAL LOW (ref 13.0–17.0)

## 2022-09-16 MED ORDER — EPOETIN ALFA-EPBX 40000 UNIT/ML IJ SOLN
INTRAMUSCULAR | Status: AC
  Filled 2022-09-16: qty 1

## 2022-09-16 MED ORDER — SODIUM CHLORIDE 0.9 % IV SOLN
510.0000 mg | INTRAVENOUS | Status: DC
  Administered 2022-09-16: 510 mg via INTRAVENOUS
  Filled 2022-09-16: qty 510

## 2022-09-16 MED ORDER — EPOETIN ALFA-EPBX 40000 UNIT/ML IJ SOLN
30000.0000 [IU] | INTRAMUSCULAR | Status: DC
  Administered 2022-09-16: 30000 [IU] via SUBCUTANEOUS

## 2022-10-14 ENCOUNTER — Ambulatory Visit (HOSPITAL_COMMUNITY)
Admission: RE | Admit: 2022-10-14 | Discharge: 2022-10-14 | Disposition: A | Discharge status: Home / Self Care | Payer: Medicare Other | Source: Ambulatory Visit | Attending: Nephrology | Admitting: Nephrology

## 2022-10-14 DIAGNOSIS — N189 Chronic kidney disease, unspecified: Secondary | ICD-10-CM | POA: Diagnosis not present

## 2022-10-14 DIAGNOSIS — Z862 Personal history of diseases of the blood and blood-forming organs and certain disorders involving the immune mechanism: Secondary | ICD-10-CM | POA: Insufficient documentation

## 2022-10-14 DIAGNOSIS — N184 Chronic kidney disease, stage 4 (severe): Secondary | ICD-10-CM | POA: Insufficient documentation

## 2022-10-14 LAB — RENAL FUNCTION PANEL
Albumin: 3.7 g/dL (ref 3.5–5.0)
Anion gap: 11 (ref 5–15)
BUN: 45 mg/dL — ABNORMAL HIGH (ref 8–23)
CO2: 24 mmol/L (ref 22–32)
Calcium: 10.5 mg/dL — ABNORMAL HIGH (ref 8.9–10.3)
Chloride: 99 mmol/L (ref 98–111)
Creatinine, Ser: 2.53 mg/dL — ABNORMAL HIGH (ref 0.61–1.24)
GFR, Estimated: 23 mL/min — ABNORMAL LOW (ref >60)
Glucose, Bld: 94 mg/dL (ref 70–99)
Phosphorus: 3.9 mg/dL (ref 2.5–4.6)
Potassium: 4.3 mmol/L (ref 3.5–5.1)
Sodium: 134 mmol/L — ABNORMAL LOW (ref 135–145)

## 2022-10-14 LAB — IRON AND TIBC
Iron: 131 ug/dL (ref 45–182)
Saturation Ratios: 64 % — ABNORMAL HIGH (ref 17.9–39.5)
TIBC: 206 ug/dL — ABNORMAL LOW (ref 250–450)
UIBC: 75 ug/dL

## 2022-10-14 LAB — FERRITIN: Ferritin: 1293 ng/mL — ABNORMAL HIGH (ref 24–336)

## 2022-10-14 LAB — POCT HEMOGLOBIN-HEMACUE: Hemoglobin: 11.6 g/dL — ABNORMAL LOW (ref 13.0–17.0)

## 2022-10-14 MED ORDER — EPOETIN ALFA-EPBX 40000 UNIT/ML IJ SOLN
30000.0000 [IU] | INTRAMUSCULAR | Status: DC
  Administered 2022-10-14: 30000 [IU] via SUBCUTANEOUS

## 2022-10-14 MED ORDER — EPOETIN ALFA-EPBX 40000 UNIT/ML IJ SOLN
INTRAMUSCULAR | Status: AC
  Filled 2022-10-14: qty 1

## 2022-10-15 LAB — PTH, INTACT AND CALCIUM
Calcium, Total (PTH): 10.5 mg/dL — ABNORMAL HIGH (ref 8.6–10.2)
PTH: 229 pg/mL — ABNORMAL HIGH (ref 15–65)

## 2022-11-11 ENCOUNTER — Encounter (HOSPITAL_COMMUNITY): Payer: Medicare Other

## 2022-11-16 ENCOUNTER — Ambulatory Visit (HOSPITAL_COMMUNITY)
Admission: RE | Admit: 2022-11-16 | Discharge: 2022-11-16 | Disposition: A | Discharge status: Home / Self Care | Payer: Medicare Other | Source: Ambulatory Visit | Attending: Nephrology | Admitting: Nephrology

## 2022-11-16 VITALS — BP 174/45 | HR 62 | Temp 97.8°F | Resp 17

## 2022-11-16 DIAGNOSIS — N184 Chronic kidney disease, stage 4 (severe): Secondary | ICD-10-CM | POA: Insufficient documentation

## 2022-11-16 DIAGNOSIS — Z862 Personal history of diseases of the blood and blood-forming organs and certain disorders involving the immune mechanism: Secondary | ICD-10-CM | POA: Insufficient documentation

## 2022-11-16 DIAGNOSIS — N189 Chronic kidney disease, unspecified: Secondary | ICD-10-CM | POA: Diagnosis not present

## 2022-11-16 LAB — POCT HEMOGLOBIN-HEMACUE: Hemoglobin: 11.6 g/dL — ABNORMAL LOW (ref 13.0–17.0)

## 2022-11-16 MED ORDER — EPOETIN ALFA-EPBX 40000 UNIT/ML IJ SOLN
INTRAMUSCULAR | Status: AC
  Administered 2022-11-16: 30000 [IU] via SUBCUTANEOUS
  Filled 2022-11-16: qty 1

## 2022-11-16 MED ORDER — EPOETIN ALFA-EPBX 40000 UNIT/ML IJ SOLN: 30000.0000 [IU] | INTRAMUSCULAR | Status: DC

## 2022-12-01 DIAGNOSIS — M25551 Pain in right hip: Secondary | ICD-10-CM | POA: Diagnosis not present

## 2022-12-14 ENCOUNTER — Ambulatory Visit (HOSPITAL_COMMUNITY)
Admission: RE | Admit: 2022-12-14 | Discharge: 2022-12-14 | Disposition: A | Discharge status: Home / Self Care | Payer: Medicare Other | Source: Ambulatory Visit | Attending: Nephrology | Admitting: Nephrology

## 2022-12-14 VITALS — BP 179/54 | HR 63 | Temp 97.0°F | Resp 17

## 2022-12-14 DIAGNOSIS — Z862 Personal history of diseases of the blood and blood-forming organs and certain disorders involving the immune mechanism: Secondary | ICD-10-CM | POA: Insufficient documentation

## 2022-12-14 DIAGNOSIS — N184 Chronic kidney disease, stage 4 (severe): Secondary | ICD-10-CM | POA: Insufficient documentation

## 2022-12-14 DIAGNOSIS — N189 Chronic kidney disease, unspecified: Secondary | ICD-10-CM | POA: Diagnosis not present

## 2022-12-14 LAB — FERRITIN: Ferritin: 894 ng/mL — ABNORMAL HIGH (ref 24–336)

## 2022-12-14 LAB — RENAL FUNCTION PANEL
Albumin: 3.3 g/dL — ABNORMAL LOW (ref 3.5–5.0)
Anion gap: 10 (ref 5–15)
BUN: 48 mg/dL — ABNORMAL HIGH (ref 8–23)
CO2: 20 mmol/L — ABNORMAL LOW (ref 22–32)
Calcium: 9.9 mg/dL (ref 8.9–10.3)
Chloride: 103 mmol/L (ref 98–111)
Creatinine, Ser: 2.61 mg/dL — ABNORMAL HIGH (ref 0.61–1.24)
GFR, Estimated: 22 mL/min — ABNORMAL LOW (ref >60)
Glucose, Bld: 99 mg/dL (ref 70–99)
Phosphorus: 3.7 mg/dL (ref 2.5–4.6)
Potassium: 3.8 mmol/L (ref 3.5–5.1)
Sodium: 133 mmol/L — ABNORMAL LOW (ref 135–145)

## 2022-12-14 LAB — IRON AND TIBC
Iron: 62 ug/dL (ref 45–182)
Saturation Ratios: 34 % (ref 17.9–39.5)
TIBC: 185 ug/dL — ABNORMAL LOW (ref 250–450)
UIBC: 123 ug/dL

## 2022-12-14 LAB — POCT HEMOGLOBIN-HEMACUE: Hemoglobin: 10.3 g/dL — ABNORMAL LOW (ref 13.0–17.0)

## 2022-12-14 MED ORDER — EPOETIN ALFA-EPBX 40000 UNIT/ML IJ SOLN
INTRAMUSCULAR | Status: AC
  Filled 2022-12-14: qty 1

## 2022-12-14 MED ORDER — EPOETIN ALFA-EPBX 40000 UNIT/ML IJ SOLN
30000.0000 [IU] | INTRAMUSCULAR | Status: DC
  Administered 2022-12-14: 30000 [IU] via SUBCUTANEOUS

## 2023-01-07 DIAGNOSIS — N2581 Secondary hyperparathyroidism of renal origin: Secondary | ICD-10-CM | POA: Diagnosis not present

## 2023-01-07 DIAGNOSIS — I129 Hypertensive chronic kidney disease with stage 1 through stage 4 chronic kidney disease, or unspecified chronic kidney disease: Secondary | ICD-10-CM | POA: Diagnosis not present

## 2023-01-07 DIAGNOSIS — N184 Chronic kidney disease, stage 4 (severe): Secondary | ICD-10-CM | POA: Diagnosis not present

## 2023-01-07 DIAGNOSIS — D631 Anemia in chronic kidney disease: Secondary | ICD-10-CM | POA: Diagnosis not present

## 2023-01-11 ENCOUNTER — Ambulatory Visit (HOSPITAL_COMMUNITY)
Admission: RE | Admit: 2023-01-11 | Discharge: 2023-01-11 | Disposition: A | Discharge status: Home / Self Care | Payer: Medicare Other | Source: Ambulatory Visit | Attending: Nephrology | Admitting: Nephrology

## 2023-01-11 VITALS — BP 164/51 | HR 60 | Temp 97.3°F | Resp 17

## 2023-01-11 DIAGNOSIS — N184 Chronic kidney disease, stage 4 (severe): Secondary | ICD-10-CM | POA: Insufficient documentation

## 2023-01-11 DIAGNOSIS — N189 Chronic kidney disease, unspecified: Secondary | ICD-10-CM | POA: Diagnosis not present

## 2023-01-11 DIAGNOSIS — Z862 Personal history of diseases of the blood and blood-forming organs and certain disorders involving the immune mechanism: Secondary | ICD-10-CM | POA: Diagnosis not present

## 2023-01-11 LAB — POCT HEMOGLOBIN-HEMACUE: Hemoglobin: 10.3 g/dL — ABNORMAL LOW (ref 13.0–17.0)

## 2023-01-11 MED ORDER — EPOETIN ALFA-EPBX 40000 UNIT/ML IJ SOLN: 30000.0000 [IU] | INTRAMUSCULAR | Status: DC

## 2023-01-11 MED ORDER — EPOETIN ALFA-EPBX 40000 UNIT/ML IJ SOLN
INTRAMUSCULAR | Status: AC
  Administered 2023-01-11: 30000 [IU] via SUBCUTANEOUS
  Filled 2023-01-11: qty 1

## 2023-02-03 DIAGNOSIS — H353221 Exudative age-related macular degeneration, left eye, with active choroidal neovascularization: Secondary | ICD-10-CM | POA: Diagnosis not present

## 2023-02-03 DIAGNOSIS — H524 Presbyopia: Secondary | ICD-10-CM | POA: Diagnosis not present

## 2023-02-03 DIAGNOSIS — H35361 Drusen (degenerative) of macula, right eye: Secondary | ICD-10-CM | POA: Diagnosis not present

## 2023-02-03 DIAGNOSIS — H401133 Primary open-angle glaucoma, bilateral, severe stage: Secondary | ICD-10-CM | POA: Diagnosis not present

## 2023-02-03 DIAGNOSIS — H35033 Hypertensive retinopathy, bilateral: Secondary | ICD-10-CM | POA: Diagnosis not present

## 2023-02-08 ENCOUNTER — Encounter (HOSPITAL_COMMUNITY): Payer: Medicare Other

## 2023-02-09 ENCOUNTER — Ambulatory Visit (HOSPITAL_COMMUNITY)
Admission: RE | Admit: 2023-02-09 | Discharge: 2023-02-09 | Disposition: A | Discharge status: Home / Self Care | Payer: Medicare Other | Source: Ambulatory Visit | Attending: Nephrology | Admitting: Nephrology

## 2023-02-09 VITALS — BP 164/50 | HR 62 | Temp 97.2°F | Resp 17

## 2023-02-09 DIAGNOSIS — N184 Chronic kidney disease, stage 4 (severe): Secondary | ICD-10-CM | POA: Insufficient documentation

## 2023-02-09 DIAGNOSIS — N189 Chronic kidney disease, unspecified: Secondary | ICD-10-CM | POA: Diagnosis not present

## 2023-02-09 DIAGNOSIS — Z862 Personal history of diseases of the blood and blood-forming organs and certain disorders involving the immune mechanism: Secondary | ICD-10-CM | POA: Insufficient documentation

## 2023-02-09 LAB — RENAL FUNCTION PANEL
Albumin: 3.7 g/dL (ref 3.5–5.0)
Anion gap: 12 (ref 5–15)
BUN: 56 mg/dL — ABNORMAL HIGH (ref 8–23)
CO2: 22 mmol/L (ref 22–32)
Calcium: 10.3 mg/dL (ref 8.9–10.3)
Chloride: 102 mmol/L (ref 98–111)
Creatinine, Ser: 2.65 mg/dL — ABNORMAL HIGH (ref 0.61–1.24)
GFR, Estimated: 22 mL/min — ABNORMAL LOW (ref >60)
Glucose, Bld: 111 mg/dL — ABNORMAL HIGH (ref 70–99)
Phosphorus: 3.5 mg/dL (ref 2.5–4.6)
Potassium: 4.3 mmol/L (ref 3.5–5.1)
Sodium: 136 mmol/L (ref 135–145)

## 2023-02-09 LAB — POCT HEMOGLOBIN-HEMACUE: Hemoglobin: 11.1 g/dL — ABNORMAL LOW (ref 13.0–17.0)

## 2023-02-09 LAB — FERRITIN: Ferritin: 809 ng/mL — ABNORMAL HIGH (ref 24–336)

## 2023-02-09 LAB — IRON AND TIBC
Iron: 105 ug/dL (ref 45–182)
Saturation Ratios: 46 % — ABNORMAL HIGH (ref 17.9–39.5)
TIBC: 230 ug/dL — ABNORMAL LOW (ref 250–450)
UIBC: 125 ug/dL

## 2023-02-09 MED ORDER — EPOETIN ALFA-EPBX 40000 UNIT/ML IJ SOLN
INTRAMUSCULAR | Status: AC
  Filled 2023-02-09: qty 1

## 2023-02-09 MED ORDER — EPOETIN ALFA-EPBX 40000 UNIT/ML IJ SOLN
30000.0000 [IU] | INTRAMUSCULAR | Status: DC
  Administered 2023-02-09: 30000 [IU] via SUBCUTANEOUS

## 2023-02-10 LAB — PTH, INTACT AND CALCIUM
Calcium, Total (PTH): 10.4 mg/dL — ABNORMAL HIGH (ref 8.6–10.2)
PTH: 202 pg/mL — ABNORMAL HIGH (ref 15–65)

## 2023-03-08 ENCOUNTER — Encounter (HOSPITAL_COMMUNITY)
Admission: RE | Admit: 2023-03-08 | Discharge: 2023-03-08 | Disposition: A | Discharge status: Home / Self Care | Payer: Medicare Other | Source: Ambulatory Visit | Attending: Nephrology | Admitting: Nephrology

## 2023-03-08 VITALS — BP 156/50 | HR 57 | Temp 97.7°F | Resp 16

## 2023-03-08 DIAGNOSIS — N184 Chronic kidney disease, stage 4 (severe): Secondary | ICD-10-CM | POA: Insufficient documentation

## 2023-03-08 DIAGNOSIS — Z862 Personal history of diseases of the blood and blood-forming organs and certain disorders involving the immune mechanism: Secondary | ICD-10-CM | POA: Diagnosis not present

## 2023-03-08 DIAGNOSIS — N189 Chronic kidney disease, unspecified: Secondary | ICD-10-CM | POA: Diagnosis not present

## 2023-03-08 LAB — POCT HEMOGLOBIN-HEMACUE: Hemoglobin: 11.7 g/dL — ABNORMAL LOW (ref 13.0–17.0)

## 2023-03-08 MED ORDER — EPOETIN ALFA-EPBX 40000 UNIT/ML IJ SOLN
30000.0000 [IU] | INTRAMUSCULAR | Status: DC
  Administered 2023-03-08: 30000 [IU] via SUBCUTANEOUS

## 2023-03-08 MED ORDER — EPOETIN ALFA-EPBX 40000 UNIT/ML IJ SOLN
INTRAMUSCULAR | Status: AC
  Filled 2023-03-08: qty 1

## 2023-03-09 ENCOUNTER — Encounter (HOSPITAL_COMMUNITY): Payer: Medicare Other

## 2023-03-09 DIAGNOSIS — E7800 Pure hypercholesterolemia, unspecified: Secondary | ICD-10-CM | POA: Diagnosis not present

## 2023-03-09 DIAGNOSIS — M159 Polyosteoarthritis, unspecified: Secondary | ICD-10-CM | POA: Diagnosis not present

## 2023-03-09 DIAGNOSIS — N184 Chronic kidney disease, stage 4 (severe): Secondary | ICD-10-CM | POA: Diagnosis not present

## 2023-03-09 DIAGNOSIS — D638 Anemia in other chronic diseases classified elsewhere: Secondary | ICD-10-CM | POA: Diagnosis not present

## 2023-03-09 DIAGNOSIS — Z0000 Encounter for general adult medical examination without abnormal findings: Secondary | ICD-10-CM | POA: Diagnosis not present

## 2023-03-09 DIAGNOSIS — I129 Hypertensive chronic kidney disease with stage 1 through stage 4 chronic kidney disease, or unspecified chronic kidney disease: Secondary | ICD-10-CM | POA: Diagnosis not present

## 2023-04-05 ENCOUNTER — Encounter (HOSPITAL_COMMUNITY)
Admission: RE | Admit: 2023-04-05 | Discharge: 2023-04-05 | Disposition: A | Discharge status: Home / Self Care | Payer: Medicare Other | Source: Ambulatory Visit | Attending: Nephrology

## 2023-04-05 VITALS — BP 160/77 | HR 63 | Temp 97.9°F | Resp 16

## 2023-04-05 DIAGNOSIS — Z862 Personal history of diseases of the blood and blood-forming organs and certain disorders involving the immune mechanism: Secondary | ICD-10-CM

## 2023-04-05 DIAGNOSIS — N184 Chronic kidney disease, stage 4 (severe): Secondary | ICD-10-CM

## 2023-04-05 DIAGNOSIS — N189 Chronic kidney disease, unspecified: Secondary | ICD-10-CM | POA: Diagnosis not present

## 2023-04-05 LAB — POCT HEMOGLOBIN-HEMACUE: Hemoglobin: 10.6 g/dL — ABNORMAL LOW (ref 13.0–17.0)

## 2023-04-05 MED ORDER — EPOETIN ALFA-EPBX 40000 UNIT/ML IJ SOLN: 30000.0000 [IU] | INTRAMUSCULAR | Status: DC

## 2023-04-05 MED ORDER — EPOETIN ALFA-EPBX 40000 UNIT/ML IJ SOLN
INTRAMUSCULAR | Status: AC
  Administered 2023-04-05: 30000 [IU] via SUBCUTANEOUS
  Filled 2023-04-05: qty 1

## 2023-04-15 DIAGNOSIS — N184 Chronic kidney disease, stage 4 (severe): Secondary | ICD-10-CM | POA: Diagnosis not present

## 2023-04-15 DIAGNOSIS — I129 Hypertensive chronic kidney disease with stage 1 through stage 4 chronic kidney disease, or unspecified chronic kidney disease: Secondary | ICD-10-CM | POA: Diagnosis not present

## 2023-04-15 DIAGNOSIS — D631 Anemia in chronic kidney disease: Secondary | ICD-10-CM | POA: Diagnosis not present

## 2023-04-15 DIAGNOSIS — N2581 Secondary hyperparathyroidism of renal origin: Secondary | ICD-10-CM | POA: Diagnosis not present

## 2023-04-26 DIAGNOSIS — I129 Hypertensive chronic kidney disease with stage 1 through stage 4 chronic kidney disease, or unspecified chronic kidney disease: Secondary | ICD-10-CM | POA: Diagnosis not present

## 2023-05-03 ENCOUNTER — Ambulatory Visit (HOSPITAL_COMMUNITY)
Admission: RE | Admit: 2023-05-03 | Discharge: 2023-05-03 | Disposition: A | Discharge status: Home / Self Care | Payer: Medicare Other | Source: Ambulatory Visit | Attending: Nephrology | Admitting: Nephrology

## 2023-05-03 VITALS — BP 171/49 | HR 60 | Temp 97.5°F | Resp 16

## 2023-05-03 DIAGNOSIS — N184 Chronic kidney disease, stage 4 (severe): Secondary | ICD-10-CM | POA: Diagnosis not present

## 2023-05-03 DIAGNOSIS — N189 Chronic kidney disease, unspecified: Secondary | ICD-10-CM | POA: Diagnosis not present

## 2023-05-03 DIAGNOSIS — Z862 Personal history of diseases of the blood and blood-forming organs and certain disorders involving the immune mechanism: Secondary | ICD-10-CM | POA: Diagnosis not present

## 2023-05-03 LAB — RENAL FUNCTION PANEL
Albumin: 3.7 g/dL (ref 3.5–5.0)
Anion gap: 8 (ref 5–15)
BUN: 50 mg/dL — ABNORMAL HIGH (ref 8–23)
CO2: 23 mmol/L (ref 22–32)
Calcium: 10.4 mg/dL — ABNORMAL HIGH (ref 8.9–10.3)
Chloride: 99 mmol/L (ref 98–111)
Creatinine, Ser: 2.66 mg/dL — ABNORMAL HIGH (ref 0.61–1.24)
GFR, Estimated: 22 mL/min — ABNORMAL LOW (ref >60)
Glucose, Bld: 96 mg/dL (ref 70–99)
Phosphorus: 3.8 mg/dL (ref 2.5–4.6)
Potassium: 4.4 mmol/L (ref 3.5–5.1)
Sodium: 130 mmol/L — ABNORMAL LOW (ref 135–145)

## 2023-05-03 LAB — FERRITIN: Ferritin: 921 ng/mL — ABNORMAL HIGH (ref 24–336)

## 2023-05-03 LAB — POCT HEMOGLOBIN-HEMACUE: Hemoglobin: 10.5 g/dL — ABNORMAL LOW (ref 13.0–17.0)

## 2023-05-03 LAB — IRON AND TIBC
Iron: 121 ug/dL (ref 45–182)
Saturation Ratios: 54 % — ABNORMAL HIGH (ref 17.9–39.5)
TIBC: 224 ug/dL — ABNORMAL LOW (ref 250–450)
UIBC: 103 ug/dL

## 2023-05-03 MED ORDER — EPOETIN ALFA-EPBX 40000 UNIT/ML IJ SOLN
INTRAMUSCULAR | Status: AC
  Administered 2023-05-03: 30000 [IU] via SUBCUTANEOUS
  Filled 2023-05-03: qty 1

## 2023-05-03 MED ORDER — EPOETIN ALFA-EPBX 40000 UNIT/ML IJ SOLN: 30000.0000 [IU] | INTRAMUSCULAR | Status: DC

## 2023-05-31 ENCOUNTER — Ambulatory Visit (HOSPITAL_COMMUNITY)
Admission: RE | Admit: 2023-05-31 | Discharge: 2023-05-31 | Disposition: A | Discharge status: Home / Self Care | Payer: Medicare Other | Source: Ambulatory Visit | Attending: Nephrology | Admitting: Nephrology

## 2023-05-31 VITALS — BP 163/50 | HR 56 | Temp 97.9°F | Resp 19

## 2023-05-31 DIAGNOSIS — Z862 Personal history of diseases of the blood and blood-forming organs and certain disorders involving the immune mechanism: Secondary | ICD-10-CM | POA: Insufficient documentation

## 2023-05-31 DIAGNOSIS — N184 Chronic kidney disease, stage 4 (severe): Secondary | ICD-10-CM | POA: Diagnosis not present

## 2023-05-31 DIAGNOSIS — N189 Chronic kidney disease, unspecified: Secondary | ICD-10-CM | POA: Diagnosis not present

## 2023-05-31 LAB — POCT HEMOGLOBIN-HEMACUE: Hemoglobin: 10.1 g/dL — ABNORMAL LOW (ref 13.0–17.0)

## 2023-05-31 MED ORDER — EPOETIN ALFA-EPBX 40000 UNIT/ML IJ SOLN
INTRAMUSCULAR | Status: AC
  Filled 2023-05-31: qty 1

## 2023-05-31 MED ORDER — EPOETIN ALFA-EPBX 40000 UNIT/ML IJ SOLN
30000.0000 [IU] | INTRAMUSCULAR | Status: DC
  Administered 2023-05-31: 30000 [IU] via SUBCUTANEOUS

## 2023-06-13 ENCOUNTER — Encounter (INDEPENDENT_AMBULATORY_CARE_PROVIDER_SITE_OTHER): Payer: Medicare Other | Admitting: Ophthalmology

## 2023-06-13 DIAGNOSIS — Z961 Presence of intraocular lens: Secondary | ICD-10-CM

## 2023-06-13 DIAGNOSIS — I10 Essential (primary) hypertension: Secondary | ICD-10-CM

## 2023-06-13 DIAGNOSIS — H401133 Primary open-angle glaucoma, bilateral, severe stage: Secondary | ICD-10-CM

## 2023-06-13 DIAGNOSIS — H353221 Exudative age-related macular degeneration, left eye, with active choroidal neovascularization: Secondary | ICD-10-CM

## 2023-06-13 DIAGNOSIS — H35052 Retinal neovascularization, unspecified, left eye: Secondary | ICD-10-CM

## 2023-06-13 DIAGNOSIS — H35033 Hypertensive retinopathy, bilateral: Secondary | ICD-10-CM

## 2023-06-16 ENCOUNTER — Encounter (INDEPENDENT_AMBULATORY_CARE_PROVIDER_SITE_OTHER): Payer: Medicare Other | Admitting: Ophthalmology

## 2023-06-20 NOTE — Progress Notes (Addendum)
 [facility] - Clinic Note  06/22/2023     CHIEF COMPLAINT Patient presents for Retina Follow Up    HISTORY OF PRESENT ILLNESS: Danny Rhodes is a 89 y.o. male who presents to the clinic today for:  HPI     Retina Follow Up   Patient presents with  Other.  In left eye.  Severity is moderate.  Duration of 9 months.  Since onset it is stable.  I, the attending physician,  performed the HPI with the patient and updated documentation appropriately.        Comments   Pt here for 9 mo ret f/u CNV OS. Pt states VA is the same, no changes. Reports still doing drops dorz BID OU and latanoprost  at bedtime OU per Dr. Carloyn Chi.       Last edited by Ronelle Coffee, MD on 06/25/2023 12:32 PM.    Pt states vision is stable  Referring physician: Vanita Gens, Md [address] [place],  [place] [address]  HISTORICAL INFORMATION:   Selected notes from the MEDICAL RECORD NUMBER Referred by Dr. Reyne Cave LEE: 07.30.21 BCVA OD 20/40, OS 20/50 Ocular Hx- POAG OU severe, Cataracts OU, HTN Retinopathy OU, NVAMD OS, Band Keratopathy OU   CURRENT MEDICATIONS: Current Outpatient Medications (Ophthalmic Drugs)  Medication Sig   dorzolamide  (TRUSOPT ) 2 % ophthalmic solution Place 1 drop into both eyes 2 (two) times daily.   latanoprost  (XALATAN ) 0.005 % ophthalmic solution Place 1 drop into both eyes at bedtime.   No current facility-administered medications for this visit. (Ophthalmic Drugs)   Current Outpatient Medications (Other)  Medication Sig   acetaminophen  (TYLENOL ) 500 MG tablet Take 1,000 mg by mouth 2 (two) times daily as needed for headache (pain).   allopurinol  (ZYLOPRIM ) 100 MG tablet Take 100 mg by mouth every morning. (Patient not taking: Reported on 12/28/2021)   amLODipine  (NORVASC ) 10 MG tablet 1 tablet   aspirin  81 MG chewable tablet Chew 1 tablet (81 mg total) by mouth 2 (two) times daily.   atorvastatin (LIPITOR) 20 MG tablet Take 20 mg by mouth  every morning.   carvedilol  (COREG ) 3.125 MG tablet Take 3.125 mg by mouth 2 (two) times daily with a meal.   methocarbamol  (ROBAXIN ) 500 MG tablet Take 1 tablet (500 mg total) by mouth every 6 (six) hours as needed for muscle spasms.   mirtazapine (REMERON) 15 MG tablet Take 15 mg by mouth at bedtime.   oxyCODONE  (OXY IR/ROXICODONE ) 5 MG immediate release tablet Take 1 tablet (5 mg total) by mouth every 4 (four) hours as needed for moderate pain.   pantoprazole  (PROTONIX ) 40 MG tablet Take 1 tablet (40 mg total) by mouth daily at 6 (six) AM.   No current facility-administered medications for this visit. (Other)   REVIEW OF SYSTEMS: ROS   Positive for: Genitourinary, Eyes Negative for: Constitutional, Gastrointestinal, Neurological, Skin, Musculoskeletal, HENT, Endocrine, Cardiovascular, Respiratory, Psychiatric, Allergic/Imm, Heme/Lymph Last edited by Anthony Bateman, COT on 06/22/2023  8:20 AM.     ALLERGIES No Known Allergies  PAST MEDICAL HISTORY Past Medical History:  Diagnosis Date   CKD (chronic kidney disease), symptom management only, stage 4 (severe) (HCC)    Glaucoma    POAG OU   History of anemia due to chronic kidney disease    Hypertensive retinopathy    OU   Past Surgical History:  Procedure Laterality Date   CATARACT EXTRACTION Bilateral    EYE SURGERY Bilateral    Cat  Sx   Hip replacement Right    20 yrs ago   TOTAL HIP ARTHROPLASTY Right 12/01/2021   Procedure: EXCISIONAL TOTAL HIP ARTHROPLASTY WITH POSTERIOR APPROACH;  Surgeon: Claiborne Crew, MD;  Location: [facility] OR;  Service: Orthopedics;  Laterality: Right;   FAMILY HISTORY History reviewed. No pertinent family history.  SOCIAL HISTORY Social History   Tobacco Use   Smoking status: Never   Smokeless tobacco: Never  Vaping Use   Vaping status: Never Used  Substance Use Topics   Alcohol use: Not Currently   Drug use: Never       OPHTHALMIC EXAM:  Base Eye Exam     Visual Acuity (Snellen -  Linear)       Right Left   Dist [place] 20/25 20/50 +1   Dist ph [place]  20/40         Tonometry (Tonopen, 8:29 AM)       Right Left   Pressure 12 10         Pupils       Pupils Dark Light Shape React APD   Right PERRL 3 2 Round Brisk None   Left PERRL 3 2 Round Brisk None         Visual Fields (Counting fingers)       Left Right    Full Full         Extraocular Movement       Right Left    Full, Ortho Full, Ortho         Neuro/Psych     Oriented x3: Yes   Mood/Affect: Normal         Dilation     Both eyes: 1.0% Mydriacyl, 2.5% Phenylephrine  @ 8:30 AM           Slit Lamp and Fundus Exam     Slit Lamp Exam       Right Left   Lids/Lashes Dermatochalasis - upper lid Dermatochalasis - upper lid   Conjunctiva/Sclera Melanosis Mild melanosis, temporal pinguecula   Cornea arcus, well healed cataract wound, early band K, central haze arcus, trace Punctate epithelial erosions, early band K nasally, well healed temporal cataract wounds, mild central haze   Anterior Chamber deep and clear, narrow temporal angle deep and narrow temporal angle   Iris Round and dilated Round and dilated   Lens PC IOL in good position PC IOL in good position   Anterior Vitreous Vitreous syneresis Vitreous syneresis         Fundus Exam       Right Left   Disc 2+Pallor, +cupping, +PPA/PPP, thin superior rim, Sharp rim 2+Pallor, +cupping, scattered PPP, thin inferior rim   C/D Ratio 0.7 0.75   Macula Flat, Blunted foveal reflex, RPE mottling, clumping and atrophy, Drusen, No heme or edema Blunted foveal reflex, focal pigment ring/CNV with stable improvement in shallow SRF IT to fovea, RPE mottling, clumping and atrophy, no heme, no fluid   Vessels attenuated, Tortuous, copper wiring, mild AV crossing changes attenuated, mild tortuosity, mild AV crossing changes   Periphery Attached, No heme, reticular degeneration Attached, focal perivascular pigment clumping temporally, No heme            Refraction     Manifest Refraction       Sphere Cylinder Axis Dist [place]   Right       Left -1.50 +1.25 015 20/30-2           IMAGING AND PROCEDURES  Imaging and Procedures for 06/22/2023  OCT, Retina - OU - Both Eyes       Right Eye Quality was good. Central Foveal Thickness: 221. Progression has been stable. Findings include normal foveal contour, no IRF, no SRF, retinal drusen (Trace ERM; partial PVD).   Left Eye Quality was good. Central Foveal Thickness: 205. Progression has been stable. Findings include normal foveal contour, no IRF, no SRF, retinal drusen , pigment epithelial detachment, outer retinal atrophy (stable resolution of IRF/SRF overlying focal PED IT fovea).   Notes *Images captured and stored on drive  Diagnosis / Impression:  OD: NFP, no IRF/SRF OS: stable resolution of IRF/SRF overlying focal PED IT fovea  Clinical management:  See below  Abbreviations: NFP - Normal foveal profile. CME - cystoid macular edema. PED - pigment epithelial detachment. IRF - intraretinal fluid. SRF - subretinal fluid. EZ - ellipsoid zone. ERM - epiretinal membrane. ORA - outer retinal atrophy. ORT - outer retinal tubulation. SRHM - subretinal hyper-reflective material. IRHM - intraretinal hyper-reflective material           ASSESSMENT/PLAN:    ICD-10-CM   1. Choroidal neovascular membrane of left eye  H35.052 OCT, Retina - OU - Both Eyes    2. Exudative age-related macular degeneration of left eye with active choroidal neovascularization (HCC)  H35.3221 OCT, Retina - OU - Both Eyes    3. Essential hypertension  I10     4. Hypertensive retinopathy of both eyes  H35.033     5. Pseudophakia, both eyes  Z96.1     6. Primary open angle glaucoma (POAG) of both eyes, severe stage  H40.1133      1,2. Choroidal neovascular membrane w/ +SRF -- stably inacrtive/resolved  - idiopathic CNV vs Exudative age related macular degeneration, OS  - interestingly, OD  without drusen and OS with minimal drusen, which we would see in typical ARMD  - s/p IVA OS #1 (08.04.21), #2 (09.02.21), #3 (10.05.21), #4 (11.11.21), #5 (12.23.21), #6 (2.3.22), #7 (03.31.22), #8 (06.09.22)  - BCVA OS 20/25  - OCT shows stable resolution of IRF/SRF overlying focal PED It fovea at 2.5+ years since last injxn  - recommend holding IVA OS again today -- pt in agreement  - f/u in 9 months, sooner prn -- DFE/OCT  3,4. Hypertensive retinopathy OU - discussed importance of tight BP control - monitor  5. Pseudophakia OU  - s/p CE/IOL (Weaver, OD 8.11.21; OS: 09.22.21)  - IOL's in good position, doing well  6. POAG OU  - under the expert management of Dr. Carloyn Chi  - IOP 12,10 today  - on dorzolamide  bid OU and latanoprost  qhs OU per Dr Carloyn Chi  Ophthalmic Meds Ordered this visit:  No orders of the defined types were placed in this encounter.    Return in about 9 months (around 03/21/2024) for f/u exu ARMD OS, DFE, OCT.  There are no Patient Instructions on file for this visit.   Explained the diagnoses, plan, and follow up with the patient and they expressed understanding.  Patient expressed understanding of the importance of proper follow up care.   This document serves as a record of services personally performed by Jeanice Millard, MD, PhD. It was created on their behalf by Morley Arabia. Bevin Bucks, OA an ophthalmic technician. The creation of this record is the provider's dictation and/or activities during the visit.    Electronically signed by: Morley Arabia. Bevin Bucks, OA 06/25/23 12:33 PM   Jeanice Millard, M.D., Ph.D. Diseases & Surgery of the Retina and Vitreous  [facility]  I have reviewed the above documentation for accuracy and completeness, and I agree with the above. Jeanice Millard, M.D., Ph.D. 06/25/23 12:47 PM  Abbreviations: M myopia (nearsighted); A astigmatism; H hyperopia (farsighted); P presbyopia; Mrx spectacle prescription;  CTL contact lenses;  OD right eye; OS left eye; OU both eyes  XT exotropia; ET esotropia; PEK punctate epithelial keratitis; PEE punctate epithelial erosions; DES dry eye syndrome; MGD meibomian gland dysfunction; ATs artificial tears; PFAT's preservative free artificial tears; NSC nuclear sclerotic cataract; PSC posterior subcapsular cataract; ERM epi-retinal membrane; PVD posterior vitreous detachment; RD retinal detachment; DM diabetes mellitus; DR diabetic retinopathy; NPDR non-proliferative diabetic retinopathy; PDR proliferative diabetic retinopathy; CSME clinically significant macular edema; DME diabetic macular edema; dbh dot blot hemorrhages; CWS cotton wool spot; POAG primary open angle glaucoma; C/D cup-to-disc ratio; HVF humphrey visual field; GVF goldmann visual field; OCT optical coherence tomography; IOP intraocular pressure; BRVO Branch retinal vein occlusion; CRVO central retinal vein occlusion; CRAO central retinal artery occlusion; BRAO branch retinal artery occlusion; RT retinal tear; SB scleral buckle; PPV pars plana vitrectomy; VH Vitreous hemorrhage; PRP panretinal laser photocoagulation; IVK intravitreal kenalog; VMT vitreomacular traction; MH Macular hole;  NVD neovascularization of the disc; NVE neovascularization elsewhere; AREDS age related eye disease study; ARMD age related macular degeneration; POAG primary open angle glaucoma; EBMD epithelial/anterior basement membrane dystrophy; ACIOL anterior chamber intraocular lens; IOL intraocular lens; PCIOL posterior chamber intraocular lens; Phaco/IOL phacoemulsification with intraocular lens placement; PRK photorefractive keratectomy; LASIK laser assisted in situ keratomileusis; HTN hypertension; DM diabetes mellitus; COPD chronic obstructive pulmonary disease

## 2023-06-22 ENCOUNTER — Ambulatory Visit (INDEPENDENT_AMBULATORY_CARE_PROVIDER_SITE_OTHER): Payer: Medicare Other | Admitting: Ophthalmology

## 2023-06-22 ENCOUNTER — Encounter (INDEPENDENT_AMBULATORY_CARE_PROVIDER_SITE_OTHER): Payer: Self-pay | Admitting: Ophthalmology

## 2023-06-22 DIAGNOSIS — H353221 Exudative age-related macular degeneration, left eye, with active choroidal neovascularization: Secondary | ICD-10-CM

## 2023-06-22 DIAGNOSIS — H35033 Hypertensive retinopathy, bilateral: Secondary | ICD-10-CM

## 2023-06-22 DIAGNOSIS — I10 Essential (primary) hypertension: Secondary | ICD-10-CM

## 2023-06-22 DIAGNOSIS — H35052 Retinal neovascularization, unspecified, left eye: Secondary | ICD-10-CM

## 2023-06-22 DIAGNOSIS — Z961 Presence of intraocular lens: Secondary | ICD-10-CM

## 2023-06-22 DIAGNOSIS — H401133 Primary open-angle glaucoma, bilateral, severe stage: Secondary | ICD-10-CM

## 2023-06-25 ENCOUNTER — Encounter (INDEPENDENT_AMBULATORY_CARE_PROVIDER_SITE_OTHER): Payer: Self-pay | Admitting: Ophthalmology

## 2023-06-28 ENCOUNTER — Ambulatory Visit (HOSPITAL_COMMUNITY)
Admission: RE | Admit: 2023-06-28 | Discharge: 2023-06-28 | Disposition: A | Discharge status: Home / Self Care | Payer: Medicare Other | Source: Ambulatory Visit | Attending: Nephrology | Admitting: Nephrology

## 2023-06-28 VITALS — BP 168/53 | HR 104 | Temp 97.5°F | Resp 17

## 2023-06-28 DIAGNOSIS — Z862 Personal history of diseases of the blood and blood-forming organs and certain disorders involving the immune mechanism: Secondary | ICD-10-CM | POA: Insufficient documentation

## 2023-06-28 DIAGNOSIS — N189 Chronic kidney disease, unspecified: Secondary | ICD-10-CM | POA: Diagnosis not present

## 2023-06-28 DIAGNOSIS — N184 Chronic kidney disease, stage 4 (severe): Secondary | ICD-10-CM | POA: Insufficient documentation

## 2023-06-28 LAB — RENAL FUNCTION PANEL
Albumin: 3.6 g/dL (ref 3.5–5.0)
Anion gap: 8 (ref 5–15)
BUN: 53 mg/dL — ABNORMAL HIGH (ref 8–23)
CO2: 22 mmol/L (ref 22–32)
Calcium: 9.8 mg/dL (ref 8.9–10.3)
Chloride: 97 mmol/L — ABNORMAL LOW (ref 98–111)
Creatinine, Ser: 2.89 mg/dL — ABNORMAL HIGH (ref 0.61–1.24)
GFR, Estimated: 20 mL/min — ABNORMAL LOW (ref >60)
Glucose, Bld: 99 mg/dL (ref 70–99)
Phosphorus: 4.3 mg/dL (ref 2.5–4.6)
Potassium: 4.6 mmol/L (ref 3.5–5.1)
Sodium: 127 mmol/L — ABNORMAL LOW (ref 135–145)

## 2023-06-28 LAB — IRON AND TIBC
Iron: 119 ug/dL (ref 45–182)
Saturation Ratios: 55 % — ABNORMAL HIGH (ref 17.9–39.5)
TIBC: 217 ug/dL — ABNORMAL LOW (ref 250–450)
UIBC: 98 ug/dL

## 2023-06-28 LAB — POCT HEMOGLOBIN-HEMACUE: Hemoglobin: 10.5 g/dL — ABNORMAL LOW (ref 13.0–17.0)

## 2023-06-28 LAB — FERRITIN: Ferritin: 922 ng/mL — ABNORMAL HIGH (ref 24–336)

## 2023-06-28 MED ORDER — EPOETIN ALFA-EPBX 40000 UNIT/ML IJ SOLN
INTRAMUSCULAR | Status: AC
  Filled 2023-06-28: qty 1

## 2023-06-28 MED ORDER — EPOETIN ALFA-EPBX 40000 UNIT/ML IJ SOLN
30000.0000 [IU] | INTRAMUSCULAR | Status: DC
  Administered 2023-06-28: 30000 [IU] via SUBCUTANEOUS

## 2023-06-29 LAB — PTH, INTACT AND CALCIUM
Calcium, Total (PTH): 9.7 mg/dL (ref 8.6–10.2)
PTH: 255 pg/mL — ABNORMAL HIGH (ref 15–65)

## 2023-07-15 DIAGNOSIS — I129 Hypertensive chronic kidney disease with stage 1 through stage 4 chronic kidney disease, or unspecified chronic kidney disease: Secondary | ICD-10-CM | POA: Diagnosis not present

## 2023-07-15 DIAGNOSIS — N2581 Secondary hyperparathyroidism of renal origin: Secondary | ICD-10-CM | POA: Diagnosis not present

## 2023-07-15 DIAGNOSIS — E871 Hypo-osmolality and hyponatremia: Secondary | ICD-10-CM | POA: Diagnosis not present

## 2023-07-15 DIAGNOSIS — N189 Chronic kidney disease, unspecified: Secondary | ICD-10-CM | POA: Diagnosis not present

## 2023-07-15 DIAGNOSIS — N184 Chronic kidney disease, stage 4 (severe): Secondary | ICD-10-CM | POA: Diagnosis not present

## 2023-07-15 DIAGNOSIS — D631 Anemia in chronic kidney disease: Secondary | ICD-10-CM | POA: Diagnosis not present

## 2023-07-25 DIAGNOSIS — N189 Chronic kidney disease, unspecified: Secondary | ICD-10-CM | POA: Diagnosis not present

## 2023-07-26 ENCOUNTER — Ambulatory Visit (HOSPITAL_COMMUNITY)
Admission: RE | Admit: 2023-07-26 | Discharge: 2023-07-26 | Disposition: A | Discharge status: Home / Self Care | Payer: Medicare Other | Source: Ambulatory Visit | Attending: Nephrology | Admitting: Nephrology

## 2023-07-26 VITALS — BP 165/56 | HR 69 | Temp 97.1°F | Resp 17

## 2023-07-26 DIAGNOSIS — N189 Chronic kidney disease, unspecified: Secondary | ICD-10-CM | POA: Diagnosis not present

## 2023-07-26 DIAGNOSIS — Z862 Personal history of diseases of the blood and blood-forming organs and certain disorders involving the immune mechanism: Secondary | ICD-10-CM | POA: Diagnosis not present

## 2023-07-26 DIAGNOSIS — N184 Chronic kidney disease, stage 4 (severe): Secondary | ICD-10-CM | POA: Diagnosis not present

## 2023-07-26 LAB — POCT HEMOGLOBIN-HEMACUE: Hemoglobin: 9.6 g/dL — ABNORMAL LOW (ref 13.0–17.0)

## 2023-07-26 MED ORDER — EPOETIN ALFA-EPBX 40000 UNIT/ML IJ SOLN
INTRAMUSCULAR | Status: AC
  Filled 2023-07-26: qty 1

## 2023-07-26 MED ORDER — EPOETIN ALFA-EPBX 40000 UNIT/ML IJ SOLN
30000.0000 [IU] | INTRAMUSCULAR | Status: DC
  Administered 2023-07-26: 30000 [IU] via SUBCUTANEOUS

## 2023-08-18 DIAGNOSIS — H401133 Primary open-angle glaucoma, bilateral, severe stage: Secondary | ICD-10-CM | POA: Diagnosis not present

## 2023-08-23 ENCOUNTER — Ambulatory Visit (HOSPITAL_COMMUNITY)
Admission: RE | Admit: 2023-08-23 | Discharge: 2023-08-23 | Disposition: A | Discharge status: Home / Self Care | Payer: Medicare Other | Source: Ambulatory Visit | Attending: Nephrology | Admitting: Nephrology

## 2023-08-23 VITALS — BP 171/60 | HR 56 | Temp 97.3°F | Resp 17

## 2023-08-23 DIAGNOSIS — Z862 Personal history of diseases of the blood and blood-forming organs and certain disorders involving the immune mechanism: Secondary | ICD-10-CM | POA: Insufficient documentation

## 2023-08-23 DIAGNOSIS — N189 Chronic kidney disease, unspecified: Secondary | ICD-10-CM | POA: Diagnosis not present

## 2023-08-23 DIAGNOSIS — N184 Chronic kidney disease, stage 4 (severe): Secondary | ICD-10-CM | POA: Diagnosis not present

## 2023-08-23 LAB — RENAL FUNCTION PANEL
Albumin: 3.6 g/dL (ref 3.5–5.0)
Anion gap: 5 (ref 5–15)
BUN: 48 mg/dL — ABNORMAL HIGH (ref 8–23)
CO2: 22 mmol/L (ref 22–32)
Calcium: 10 mg/dL (ref 8.9–10.3)
Chloride: 106 mmol/L (ref 98–111)
Creatinine, Ser: 2.94 mg/dL — ABNORMAL HIGH (ref 0.61–1.24)
GFR, Estimated: 19 mL/min — ABNORMAL LOW (ref >60)
Glucose, Bld: 116 mg/dL — ABNORMAL HIGH (ref 70–99)
Phosphorus: 4 mg/dL (ref 2.5–4.6)
Potassium: 4.6 mmol/L (ref 3.5–5.1)
Sodium: 133 mmol/L — ABNORMAL LOW (ref 135–145)

## 2023-08-23 LAB — IRON AND TIBC
Iron: 92 ug/dL (ref 45–182)
Saturation Ratios: 44 % — ABNORMAL HIGH (ref 17.9–39.5)
TIBC: 211 ug/dL — ABNORMAL LOW (ref 250–450)
UIBC: 119 ug/dL

## 2023-08-23 LAB — FERRITIN: Ferritin: 679 ng/mL — ABNORMAL HIGH (ref 24–336)

## 2023-08-23 LAB — POCT HEMOGLOBIN-HEMACUE: Hemoglobin: 9.7 g/dL — ABNORMAL LOW (ref 13.0–17.0)

## 2023-08-23 MED ORDER — EPOETIN ALFA-EPBX 40000 UNIT/ML IJ SOLN
INTRAMUSCULAR | Status: AC
  Filled 2023-08-23: qty 1

## 2023-08-23 MED ORDER — EPOETIN ALFA-EPBX 40000 UNIT/ML IJ SOLN
30000.0000 [IU] | INTRAMUSCULAR | Status: DC
  Administered 2023-08-23: 30000 [IU] via SUBCUTANEOUS

## 2023-09-13 DIAGNOSIS — I129 Hypertensive chronic kidney disease with stage 1 through stage 4 chronic kidney disease, or unspecified chronic kidney disease: Secondary | ICD-10-CM | POA: Diagnosis not present

## 2023-09-13 DIAGNOSIS — E7800 Pure hypercholesterolemia, unspecified: Secondary | ICD-10-CM | POA: Diagnosis not present

## 2023-09-13 DIAGNOSIS — M159 Polyosteoarthritis, unspecified: Secondary | ICD-10-CM | POA: Diagnosis not present

## 2023-09-13 DIAGNOSIS — N184 Chronic kidney disease, stage 4 (severe): Secondary | ICD-10-CM | POA: Diagnosis not present

## 2023-09-13 DIAGNOSIS — D638 Anemia in other chronic diseases classified elsewhere: Secondary | ICD-10-CM | POA: Diagnosis not present

## 2023-09-13 DIAGNOSIS — M109 Gout, unspecified: Secondary | ICD-10-CM | POA: Diagnosis not present

## 2023-09-20 ENCOUNTER — Ambulatory Visit (HOSPITAL_COMMUNITY)
Admission: RE | Admit: 2023-09-20 | Discharge: 2023-09-20 | Disposition: A | Discharge status: Home / Self Care | Source: Ambulatory Visit | Attending: Nephrology | Admitting: Nephrology

## 2023-09-20 VITALS — BP 172/52 | HR 62 | Temp 97.5°F | Resp 17

## 2023-09-20 DIAGNOSIS — Z862 Personal history of diseases of the blood and blood-forming organs and certain disorders involving the immune mechanism: Secondary | ICD-10-CM | POA: Diagnosis not present

## 2023-09-20 DIAGNOSIS — N184 Chronic kidney disease, stage 4 (severe): Secondary | ICD-10-CM | POA: Diagnosis not present

## 2023-09-20 DIAGNOSIS — N189 Chronic kidney disease, unspecified: Secondary | ICD-10-CM | POA: Diagnosis not present

## 2023-09-20 LAB — POCT HEMOGLOBIN-HEMACUE: Hemoglobin: 9.8 g/dL — ABNORMAL LOW (ref 13.0–17.0)

## 2023-09-20 MED ORDER — EPOETIN ALFA-EPBX 40000 UNIT/ML IJ SOLN
INTRAMUSCULAR | Status: AC
  Administered 2023-09-20: 30000 [IU] via SUBCUTANEOUS
  Filled 2023-09-20: qty 1

## 2023-09-20 MED ORDER — EPOETIN ALFA-EPBX 40000 UNIT/ML IJ SOLN: 30000.0000 [IU] | INTRAMUSCULAR | Status: DC

## 2023-10-14 DIAGNOSIS — N189 Chronic kidney disease, unspecified: Secondary | ICD-10-CM | POA: Diagnosis not present

## 2023-10-14 DIAGNOSIS — I129 Hypertensive chronic kidney disease with stage 1 through stage 4 chronic kidney disease, or unspecified chronic kidney disease: Secondary | ICD-10-CM | POA: Diagnosis not present

## 2023-10-14 DIAGNOSIS — N2581 Secondary hyperparathyroidism of renal origin: Secondary | ICD-10-CM | POA: Diagnosis not present

## 2023-10-14 DIAGNOSIS — D631 Anemia in chronic kidney disease: Secondary | ICD-10-CM | POA: Diagnosis not present

## 2023-10-14 DIAGNOSIS — N184 Chronic kidney disease, stage 4 (severe): Secondary | ICD-10-CM | POA: Diagnosis not present

## 2023-10-14 DIAGNOSIS — E871 Hypo-osmolality and hyponatremia: Secondary | ICD-10-CM | POA: Diagnosis not present

## 2023-10-18 ENCOUNTER — Ambulatory Visit (HOSPITAL_COMMUNITY)
Admission: RE | Admit: 2023-10-18 | Discharge: 2023-10-18 | Disposition: A | Discharge status: Home / Self Care | Source: Ambulatory Visit | Attending: Nephrology | Admitting: Nephrology

## 2023-10-18 VITALS — BP 173/56 | HR 61 | Temp 97.2°F | Resp 17

## 2023-10-18 DIAGNOSIS — Z862 Personal history of diseases of the blood and blood-forming organs and certain disorders involving the immune mechanism: Secondary | ICD-10-CM | POA: Insufficient documentation

## 2023-10-18 DIAGNOSIS — N184 Chronic kidney disease, stage 4 (severe): Secondary | ICD-10-CM | POA: Diagnosis not present

## 2023-10-18 DIAGNOSIS — N189 Chronic kidney disease, unspecified: Secondary | ICD-10-CM | POA: Insufficient documentation

## 2023-10-18 LAB — RENAL FUNCTION PANEL
Albumin: 3.7 g/dL (ref 3.5–5.0)
Anion gap: 7 (ref 5–15)
BUN: 49 mg/dL — ABNORMAL HIGH (ref 8–23)
CO2: 20 mmol/L — ABNORMAL LOW (ref 22–32)
Calcium: 10.1 mg/dL (ref 8.9–10.3)
Chloride: 102 mmol/L (ref 98–111)
Creatinine, Ser: 3.04 mg/dL — ABNORMAL HIGH (ref 0.61–1.24)
GFR, Estimated: 18 mL/min — ABNORMAL LOW (ref >60)
Glucose, Bld: 100 mg/dL — ABNORMAL HIGH (ref 70–99)
Phosphorus: 3.8 mg/dL (ref 2.5–4.6)
Potassium: 4.5 mmol/L (ref 3.5–5.1)
Sodium: 129 mmol/L — ABNORMAL LOW (ref 135–145)

## 2023-10-18 LAB — POCT HEMOGLOBIN-HEMACUE: Hemoglobin: 10.1 g/dL — ABNORMAL LOW (ref 13.0–17.0)

## 2023-10-18 LAB — IRON AND TIBC
Iron: 66 ug/dL (ref 45–182)
Saturation Ratios: 31 % (ref 17.9–39.5)
TIBC: 213 ug/dL — ABNORMAL LOW (ref 250–450)
UIBC: 147 ug/dL

## 2023-10-18 LAB — FERRITIN: Ferritin: 591 ng/mL — ABNORMAL HIGH (ref 24–336)

## 2023-10-18 MED ORDER — EPOETIN ALFA-EPBX 40000 UNIT/ML IJ SOLN
INTRAMUSCULAR | Status: AC
  Filled 2023-10-18: qty 1

## 2023-10-18 MED ORDER — EPOETIN ALFA-EPBX 40000 UNIT/ML IJ SOLN
30000.0000 [IU] | INTRAMUSCULAR | Status: DC
  Administered 2023-10-18: 30000 [IU] via SUBCUTANEOUS

## 2023-10-19 LAB — PTH, INTACT AND CALCIUM
Calcium, Total (PTH): 10.1 mg/dL (ref 8.6–10.2)
PTH: 363 pg/mL — ABNORMAL HIGH (ref 15–65)

## 2023-11-14 ENCOUNTER — Encounter (HOSPITAL_COMMUNITY): Payer: Self-pay

## 2023-11-14 DIAGNOSIS — D631 Anemia in chronic kidney disease: Secondary | ICD-10-CM | POA: Insufficient documentation

## 2023-11-15 ENCOUNTER — Ambulatory Visit (HOSPITAL_COMMUNITY)
Admission: RE | Admit: 2023-11-15 | Discharge: 2023-11-15 | Disposition: A | Discharge status: Home / Self Care | Source: Ambulatory Visit | Attending: Nephrology | Admitting: Nephrology

## 2023-11-15 VITALS — BP 171/48 | HR 65 | Temp 97.5°F | Resp 17

## 2023-11-15 DIAGNOSIS — Z862 Personal history of diseases of the blood and blood-forming organs and certain disorders involving the immune mechanism: Secondary | ICD-10-CM | POA: Diagnosis not present

## 2023-11-15 DIAGNOSIS — N189 Chronic kidney disease, unspecified: Secondary | ICD-10-CM | POA: Insufficient documentation

## 2023-11-15 DIAGNOSIS — D631 Anemia in chronic kidney disease: Secondary | ICD-10-CM | POA: Insufficient documentation

## 2023-11-15 DIAGNOSIS — N184 Chronic kidney disease, stage 4 (severe): Secondary | ICD-10-CM | POA: Diagnosis not present

## 2023-11-15 LAB — POCT HEMOGLOBIN-HEMACUE: Hemoglobin: 9.8 g/dL — ABNORMAL LOW (ref 13.0–17.0)

## 2023-11-15 MED ORDER — EPOETIN ALFA-EPBX 40000 UNIT/ML IJ SOLN
30000.0000 [IU] | INTRAMUSCULAR | Status: DC
  Administered 2023-11-15: 30000 [IU] via SUBCUTANEOUS

## 2023-11-15 MED ORDER — EPOETIN ALFA-EPBX 40000 UNIT/ML IJ SOLN
INTRAMUSCULAR | Status: AC
  Filled 2023-11-15: qty 1

## 2023-12-13 ENCOUNTER — Ambulatory Visit (HOSPITAL_COMMUNITY)
Admission: RE | Admit: 2023-12-13 | Discharge: 2023-12-13 | Disposition: A | Discharge status: Home / Self Care | Source: Ambulatory Visit | Attending: Nephrology | Admitting: Nephrology

## 2023-12-13 VITALS — BP 176/59 | HR 66 | Temp 97.4°F | Resp 16

## 2023-12-13 DIAGNOSIS — D631 Anemia in chronic kidney disease: Secondary | ICD-10-CM | POA: Insufficient documentation

## 2023-12-13 DIAGNOSIS — Z862 Personal history of diseases of the blood and blood-forming organs and certain disorders involving the immune mechanism: Secondary | ICD-10-CM | POA: Diagnosis not present

## 2023-12-13 DIAGNOSIS — N189 Chronic kidney disease, unspecified: Secondary | ICD-10-CM | POA: Insufficient documentation

## 2023-12-13 DIAGNOSIS — N184 Chronic kidney disease, stage 4 (severe): Secondary | ICD-10-CM | POA: Insufficient documentation

## 2023-12-13 LAB — RENAL FUNCTION PANEL
Albumin: 3.5 g/dL (ref 3.5–5.0)
Anion gap: 11 (ref 5–15)
BUN: 46 mg/dL — ABNORMAL HIGH (ref 8–23)
CO2: 17 mmol/L — ABNORMAL LOW (ref 22–32)
Calcium: 10.3 mg/dL (ref 8.9–10.3)
Chloride: 102 mmol/L (ref 98–111)
Creatinine, Ser: 2.79 mg/dL — ABNORMAL HIGH (ref 0.61–1.24)
GFR, Estimated: 20 mL/min — ABNORMAL LOW (ref >60)
Glucose, Bld: 103 mg/dL — ABNORMAL HIGH (ref 70–99)
Phosphorus: 3.8 mg/dL (ref 2.5–4.6)
Potassium: 4.3 mmol/L (ref 3.5–5.1)
Sodium: 130 mmol/L — ABNORMAL LOW (ref 135–145)

## 2023-12-13 LAB — FERRITIN: Ferritin: 662 ng/mL — ABNORMAL HIGH (ref 24–336)

## 2023-12-13 LAB — IRON AND TIBC
Iron: 113 ug/dL (ref 45–182)
Saturation Ratios: 56 % — ABNORMAL HIGH (ref 17.9–39.5)
TIBC: 203 ug/dL — ABNORMAL LOW (ref 250–450)
UIBC: 90 ug/dL

## 2023-12-13 LAB — POCT HEMOGLOBIN-HEMACUE: Hemoglobin: 10 g/dL — ABNORMAL LOW (ref 13.0–17.0)

## 2023-12-13 MED ORDER — EPOETIN ALFA-EPBX 40000 UNIT/ML IJ SOLN
30000.0000 [IU] | INTRAMUSCULAR | Status: DC
  Administered 2023-12-13: 30000 [IU] via SUBCUTANEOUS

## 2023-12-13 MED ORDER — EPOETIN ALFA-EPBX 40000 UNIT/ML IJ SOLN
INTRAMUSCULAR | Status: AC
Start: 2023-12-13 — End: 2023-12-13
  Filled 2023-12-13: qty 1

## 2023-12-31 ENCOUNTER — Inpatient Hospital Stay (HOSPITAL_COMMUNITY)
Admission: EM | Admit: 2023-12-31 | Discharge: 2024-01-05 | DRG: 641 | Disposition: A | Discharge status: Home / Self Care | Attending: Internal Medicine | Admitting: Internal Medicine

## 2023-12-31 ENCOUNTER — Other Ambulatory Visit: Payer: Self-pay

## 2023-12-31 DIAGNOSIS — Z7982 Long term (current) use of aspirin: Secondary | ICD-10-CM

## 2023-12-31 DIAGNOSIS — I130 Hypertensive heart and chronic kidney disease with heart failure and stage 1 through stage 4 chronic kidney disease, or unspecified chronic kidney disease: Secondary | ICD-10-CM | POA: Diagnosis present

## 2023-12-31 DIAGNOSIS — E785 Hyperlipidemia, unspecified: Secondary | ICD-10-CM | POA: Diagnosis present

## 2023-12-31 DIAGNOSIS — Z8249 Family history of ischemic heart disease and other diseases of the circulatory system: Secondary | ICD-10-CM | POA: Diagnosis not present

## 2023-12-31 DIAGNOSIS — R55 Syncope and collapse: Secondary | ICD-10-CM

## 2023-12-31 DIAGNOSIS — I4891 Unspecified atrial fibrillation: Secondary | ICD-10-CM | POA: Diagnosis present

## 2023-12-31 DIAGNOSIS — I5032 Chronic diastolic (congestive) heart failure: Secondary | ICD-10-CM | POA: Diagnosis not present

## 2023-12-31 DIAGNOSIS — Z9841 Cataract extraction status, right eye: Secondary | ICD-10-CM | POA: Diagnosis not present

## 2023-12-31 DIAGNOSIS — E872 Acidosis, unspecified: Secondary | ICD-10-CM | POA: Diagnosis not present

## 2023-12-31 DIAGNOSIS — I10 Essential (primary) hypertension: Secondary | ICD-10-CM | POA: Diagnosis not present

## 2023-12-31 DIAGNOSIS — Z604 Social exclusion and rejection: Secondary | ICD-10-CM | POA: Diagnosis present

## 2023-12-31 DIAGNOSIS — E871 Hypo-osmolality and hyponatremia: Principal | ICD-10-CM | POA: Diagnosis present

## 2023-12-31 DIAGNOSIS — D631 Anemia in chronic kidney disease: Secondary | ICD-10-CM | POA: Diagnosis not present

## 2023-12-31 DIAGNOSIS — M109 Gout, unspecified: Secondary | ICD-10-CM | POA: Diagnosis not present

## 2023-12-31 DIAGNOSIS — Z79899 Other long term (current) drug therapy: Secondary | ICD-10-CM

## 2023-12-31 DIAGNOSIS — Z7901 Long term (current) use of anticoagulants: Secondary | ICD-10-CM

## 2023-12-31 DIAGNOSIS — Z66 Do not resuscitate: Secondary | ICD-10-CM | POA: Diagnosis not present

## 2023-12-31 DIAGNOSIS — R627 Adult failure to thrive: Secondary | ICD-10-CM | POA: Diagnosis not present

## 2023-12-31 DIAGNOSIS — N184 Chronic kidney disease, stage 4 (severe): Secondary | ICD-10-CM | POA: Diagnosis not present

## 2023-12-31 DIAGNOSIS — Z9842 Cataract extraction status, left eye: Secondary | ICD-10-CM

## 2023-12-31 DIAGNOSIS — N189 Chronic kidney disease, unspecified: Secondary | ICD-10-CM | POA: Diagnosis present

## 2023-12-31 LAB — CREATININE, URINE, RANDOM: Creatinine, Urine: 42 mg/dL

## 2023-12-31 LAB — BASIC METABOLIC PANEL WITH GFR
Anion gap: 11 (ref 5–15)
Anion gap: 9 (ref 5–15)
Anion gap: 7 (ref 5–15)
BUN: 42 mg/dL — ABNORMAL HIGH (ref 8–23)
BUN: 47 mg/dL — ABNORMAL HIGH (ref 8–23)
BUN: 45 mg/dL — ABNORMAL HIGH (ref 8–23)
CO2: 17 mmol/L — ABNORMAL LOW (ref 22–32)
CO2: 18 mmol/L — ABNORMAL LOW (ref 22–32)
CO2: 18 mmol/L — ABNORMAL LOW (ref 22–32)
Calcium: 9.7 mg/dL (ref 8.9–10.3)
Calcium: 9.4 mg/dL (ref 8.9–10.3)
Calcium: 9.2 mg/dL (ref 8.9–10.3)
Chloride: 92 mmol/L — ABNORMAL LOW (ref 98–111)
Chloride: 93 mmol/L — ABNORMAL LOW (ref 98–111)
Chloride: 96 mmol/L — ABNORMAL LOW (ref 98–111)
Creatinine, Ser: 2.64 mg/dL — ABNORMAL HIGH (ref 0.61–1.24)
Creatinine, Ser: 2.6 mg/dL — ABNORMAL HIGH (ref 0.61–1.24)
Creatinine, Ser: 2.61 mg/dL — ABNORMAL HIGH (ref 0.61–1.24)
GFR, Estimated: 22 mL/min — ABNORMAL LOW (ref >60)
GFR, Estimated: 22 mL/min — ABNORMAL LOW (ref >60)
GFR, Estimated: 22 mL/min — ABNORMAL LOW (ref >60)
Glucose, Bld: 93 mg/dL (ref 70–99)
Glucose, Bld: 92 mg/dL (ref 70–99)
Glucose, Bld: 89 mg/dL (ref 70–99)
Potassium: 4.5 mmol/L (ref 3.5–5.1)
Potassium: 4.2 mmol/L (ref 3.5–5.1)
Potassium: 4.2 mmol/L (ref 3.5–5.1)
Sodium: 120 mmol/L — ABNORMAL LOW (ref 135–145)
Sodium: 120 mmol/L — ABNORMAL LOW (ref 135–145)
Sodium: 121 mmol/L — ABNORMAL LOW (ref 135–145)

## 2023-12-31 LAB — CBC WITH DIFFERENTIAL/PLATELET
Abs Immature Granulocytes: 0.01 K/uL (ref 0.00–0.07)
Basophils Absolute: 0 K/uL (ref 0.0–0.1)
Basophils Relative: 1 %
Eosinophils Absolute: 0.1 K/uL (ref 0.0–0.5)
Eosinophils Relative: 3 %
HCT: 32.9 % — ABNORMAL LOW (ref 39.0–52.0)
Hemoglobin: 10.4 g/dL — ABNORMAL LOW (ref 13.0–17.0)
Immature Granulocytes: 0 %
Lymphocytes Relative: 19 %
Lymphs Abs: 0.8 K/uL (ref 0.7–4.0)
MCH: 25.9 pg — ABNORMAL LOW (ref 26.0–34.0)
MCHC: 31.6 g/dL (ref 30.0–36.0)
MCV: 81.8 fL (ref 80.0–100.0)
Monocytes Absolute: 0.4 K/uL (ref 0.1–1.0)
Monocytes Relative: 11 %
Neutro Abs: 2.7 K/uL (ref 1.7–7.7)
Neutrophils Relative %: 66 %
Platelets: 278 K/uL (ref 150–400)
RBC: 4.02 MIL/uL — ABNORMAL LOW (ref 4.22–5.81)
RDW: 17 % — ABNORMAL HIGH (ref 11.5–15.5)
WBC: 4.1 K/uL (ref 4.0–10.5)
nRBC: 0 % (ref 0.0–0.2)

## 2023-12-31 LAB — COMPREHENSIVE METABOLIC PANEL WITH GFR
ALT: 11 U/L (ref 0–44)
AST: 21 U/L (ref 15–41)
Albumin: 3.7 g/dL (ref 3.5–5.0)
Alkaline Phosphatase: 85 U/L (ref 38–126)
Anion gap: 6 (ref 5–15)
BUN: 46 mg/dL — ABNORMAL HIGH (ref 8–23)
CO2: 22 mmol/L (ref 22–32)
Calcium: 9.6 mg/dL (ref 8.9–10.3)
Chloride: 90 mmol/L — ABNORMAL LOW (ref 98–111)
Creatinine, Ser: 2.64 mg/dL — ABNORMAL HIGH (ref 0.61–1.24)
GFR, Estimated: 22 mL/min — ABNORMAL LOW (ref >60)
Glucose, Bld: 99 mg/dL (ref 70–99)
Potassium: 4.8 mmol/L (ref 3.5–5.1)
Sodium: 118 mmol/L — CL (ref 135–145)
Total Bilirubin: 0.7 mg/dL (ref 0.0–1.2)
Total Protein: 7.5 g/dL (ref 6.5–8.1)

## 2023-12-31 LAB — OSMOLALITY, URINE: Osmolality, Ur: 193 mosm/kg — ABNORMAL LOW (ref 300–900)

## 2023-12-31 LAB — URINALYSIS, ROUTINE W REFLEX MICROSCOPIC
Bacteria, UA: NONE SEEN
Bilirubin Urine: NEGATIVE
Glucose, UA: NEGATIVE mg/dL
Hgb urine dipstick: NEGATIVE
Ketones, ur: NEGATIVE mg/dL
Leukocytes,Ua: NEGATIVE
Nitrite: NEGATIVE
Protein, ur: >=300 mg/dL — AB
Specific Gravity, Urine: 1.007 (ref 1.005–1.030)
pH: 7 (ref 5.0–8.0)

## 2023-12-31 LAB — MAGNESIUM: Magnesium: 2.2 mg/dL (ref 1.7–2.4)

## 2023-12-31 LAB — MRSA NEXT GEN BY PCR, NASAL: MRSA by PCR Next Gen: NOT DETECTED

## 2023-12-31 LAB — OSMOLALITY: Osmolality: 267 mosm/kg — ABNORMAL LOW (ref 275–295)

## 2023-12-31 LAB — TSH: TSH: 2.174 u[IU]/mL (ref 0.350–4.500)

## 2023-12-31 MED ORDER — ONDANSETRON HCL 4 MG PO TABS: 4.0000 mg | ORAL_TABLET | Freq: Four times a day (QID) | ORAL | Status: DC | PRN

## 2023-12-31 MED ORDER — AMLODIPINE BESYLATE 10 MG PO TABS
10.0000 mg | ORAL_TABLET | Freq: Every day | ORAL | Status: DC
  Administered 2023-12-31 – 2024-01-04 (×5): 10 mg via ORAL
  Filled 2023-12-31 (×5): qty 1

## 2023-12-31 MED ORDER — LATANOPROST 0.005 % OP SOLN
1.0000 [drp] | Freq: Every day | OPHTHALMIC | Status: DC
  Administered 2023-12-31 – 2024-01-04 (×5): 1 [drp] via OPHTHALMIC
  Filled 2023-12-31: qty 2.5

## 2023-12-31 MED ORDER — CHLORHEXIDINE GLUCONATE CLOTH 2 % EX PADS
6.0000 | MEDICATED_PAD | Freq: Every day | CUTANEOUS | Status: DC
  Administered 2023-12-31 – 2024-01-01 (×2): 6 via TOPICAL

## 2023-12-31 MED ORDER — HYDRALAZINE HCL 20 MG/ML IJ SOLN
10.0000 mg | INTRAMUSCULAR | Status: DC | PRN
  Administered 2023-12-31 – 2024-01-01 (×6): 10 mg via INTRAVENOUS
  Filled 2023-12-31 (×6): qty 1

## 2023-12-31 MED ORDER — LABETALOL HCL 5 MG/ML IV SOLN
10.0000 mg | Freq: Once | INTRAVENOUS | Status: AC
  Administered 2023-12-31: 10 mg via INTRAVENOUS
  Filled 2023-12-31: qty 4

## 2023-12-31 MED ORDER — ORAL CARE MOUTH RINSE: 15.0000 mL | OROMUCOSAL | Status: DC | PRN

## 2023-12-31 MED ORDER — ALLOPURINOL 100 MG PO TABS
100.0000 mg | ORAL_TABLET | Freq: Every morning | ORAL | Status: DC
Start: 2023-12-31 — End: 2024-01-05
  Administered 2023-12-31 – 2024-01-05 (×6): 100 mg via ORAL
  Filled 2023-12-31 (×6): qty 1

## 2023-12-31 MED ORDER — ATROPINE SULFATE 1 MG/10ML IJ SOSY: 0.5000 mg | PREFILLED_SYRINGE | INTRAMUSCULAR | Status: DC | PRN

## 2023-12-31 MED ORDER — SODIUM CHLORIDE 0.9 % IV SOLN: INTRAVENOUS | Status: AC

## 2023-12-31 MED ORDER — ATORVASTATIN CALCIUM 20 MG PO TABS
20.0000 mg | ORAL_TABLET | Freq: Every day | ORAL | Status: DC
  Administered 2023-12-31 – 2024-01-05 (×6): 20 mg via ORAL
  Filled 2023-12-31 (×6): qty 1

## 2023-12-31 MED ORDER — ACETAMINOPHEN 325 MG PO TABS: 650.0000 mg | ORAL_TABLET | Freq: Four times a day (QID) | ORAL | Status: DC | PRN

## 2023-12-31 MED ORDER — ONDANSETRON HCL 4 MG/2ML IJ SOLN: 4.0000 mg | Freq: Four times a day (QID) | INTRAMUSCULAR | Status: DC | PRN

## 2023-12-31 MED ORDER — ACETAMINOPHEN 650 MG RE SUPP: 650.0000 mg | Freq: Four times a day (QID) | RECTAL | Status: DC | PRN

## 2023-12-31 NOTE — H&P (Signed)
 History and Physical    Patient: Danny Rhodes FMW:[id number] DOB: [birth year] DOA: 12/31/2023 DOS: the patient was seen and examined on 12/31/2023 PCP: Gerome Brunet, DO  Patient coming from: Home  Chief Complaint:  Chief Complaint  Patient presents with   Weakness   Dizziness   HPI: Danny Rhodes is a 89 y.o. male with medical history significant of CKD, hypertensive retinopathy, glaucoma, anemia of renal disease, osteoarthritis, right hip prosthesis dislocation, dysphagia, failure to thrive, gout, protein calorie malnutrition, hyperlipidemia, transaminitis who presented to the emergency department with generalized weakness and dizziness.  No significant changes in appetite recently.  His furosemide  was increased from 40 mg twice daily to 80 mg twice a day last month.  He has been on a low-sodium diet at home.  He denied fever, chills, rhinorrhea, sore throat, wheezing or hemoptysis.  No chest pain, palpitations, diaphoresis, PND, orthopnea or pitting edema of the lower extremities.  No abdominal pain, nausea, emesis, diarrhea, constipation, melena or hematochezia.  No flank pain, dysuria, frequency or hematuria.  No polyuria, polydipsia, polyphagia or blurred vision.   Lab work: Urinalysis showed greater than 300 mg/dL of protein, but was otherwise unremarkable.  Urine creatinine was normal at 42 mg/dL.  CBC showed white count 4.1, hemoglobin 10.4 g/dL and platelets 721.  Magnesium is 2.2 mg/dL.  Normal TSH level.  CMP showed sodium 118, potassium 4.8, chloride 90 and CO2 22 mmol/L.  BUN was 46 and creatinine 2.64 mg/dL.  Normal glucose, calcium  and LFTs.  ED course: Initial vital signs were temperature 97.8 F, pulse 69, respirations 16, BP 185/53 mmHg O2 sat 100% on room air.   Review of Systems: As mentioned in the history of present illness. All other systems reviewed and are negative.  Past Medical History:  Diagnosis Date   CKD (chronic kidney disease), symptom management only,  stage 4 (severe) (HCC)    Glaucoma    POAG OU   History of anemia due to chronic kidney disease    Hypertensive retinopathy    OU   Past Surgical History:  Procedure Laterality Date   CATARACT EXTRACTION Bilateral    EYE SURGERY Bilateral    Cat Sx   Hip replacement Right    20 yrs ago   TOTAL HIP ARTHROPLASTY Right 12/01/2021   Procedure: EXCISIONAL TOTAL HIP ARTHROPLASTY WITH POSTERIOR APPROACH;  Surgeon: Ernie Cough, MD;  Location: [facility] OR;  Service: Orthopedics;  Laterality: Right;   Social History:  reports that he has never smoked. He has never used smokeless tobacco. He reports that he does not currently use alcohol. He reports that he does not use drugs.  No Known Allergies  No family history on file.  Prior to Admission medications   Medication Sig Start Date End Date Taking? Authorizing Provider  acetaminophen  (TYLENOL ) 500 MG tablet Take 1,000 mg by mouth 2 (two) times daily as needed for headache (pain).    [provider]  allopurinol  (ZYLOPRIM ) 100 MG tablet Take 100 mg by mouth every morning. Patient not taking: Reported on 12/28/2021    [provider]  amLODipine  (NORVASC ) 10 MG tablet 1 tablet 01/04/22   [provider]  aspirin  81 MG chewable tablet Chew 1 tablet (81 mg total) by mouth 2 (two) times daily. 12/05/21   Vann, Jessica U, DO  atorvastatin  (LIPITOR) 20 MG tablet Take 20 mg by mouth every morning. 09/26/17   [provider]  carvedilol  (COREG ) 3.125 MG tablet Take 3.125 mg by mouth 2 (  two) times daily with a meal.    [provider]  dorzolamide  (TRUSOPT ) 2 % ophthalmic solution Place 1 drop into both eyes 2 (two) times daily.    [provider]  latanoprost  (XALATAN ) 0.005 % ophthalmic solution Place 1 drop into both eyes at bedtime.    [provider]  methocarbamol  (ROBAXIN ) 500 MG tablet Take 1 tablet (500 mg total) by mouth every 6 (six) hours as needed for muscle spasms. 12/05/21   Vann,  Jessica U, DO  mirtazapine (REMERON) 15 MG tablet Take 15 mg by mouth at bedtime. 01/09/22   [provider]  oxyCODONE  (OXY IR/ROXICODONE ) 5 MG immediate release tablet Take 1 tablet (5 mg total) by mouth every 4 (four) hours as needed for moderate pain. 12/05/21   Vann, Jessica U, DO  pantoprazole  (PROTONIX ) 40 MG tablet Take 1 tablet (40 mg total) by mouth daily at 6 (six) AM. 12/06/21   Juvenal Harlene PENNER, DO    Physical Exam: Vitals:   12/31/23 0936 12/31/23 1030 12/31/23 1045 12/31/23 1115  BP:  (!) 156/50 (!) 163/59 (!) 172/60  Pulse: 71 (!) 135 73 66  Resp: 14 13 13 12   Temp:      TempSrc:      SpO2: 100% 99% 100% 99%  Weight:      Height:       Physical Exam Vitals and nursing note reviewed.  Constitutional:      General: He is awake. He is not in acute distress.    Appearance: He is ill-appearing.  HENT:     Head: Normocephalic.     Nose: No rhinorrhea.     Mouth/Throat:     Mouth: Mucous membranes are moist.  Eyes:     General: No scleral icterus.    Pupils: Pupils are equal, round, and reactive to light.  Neck:     Vascular: No JVD.  Cardiovascular:     Rate and Rhythm: Normal rate and regular rhythm.     Heart sounds: S1 normal and S2 normal.  Pulmonary:     Effort: Pulmonary effort is normal.     Breath sounds: Normal breath sounds. No wheezing, rhonchi or rales.  Abdominal:     General: Bowel sounds are normal. There is no distension.     Palpations: Abdomen is soft.     Tenderness: There is no abdominal tenderness. There is no right CVA tenderness or left CVA tenderness.  Musculoskeletal:     Cervical back: Neck supple.     Right lower leg: Edema present.     Left lower leg: Edema present.  Skin:    General: Skin is warm and dry.  Neurological:     General: No focal deficit present.     Mental Status: He is alert and oriented to person, place, and time.  Psychiatric:        Mood and Affect: Mood normal.        Behavior: Behavior normal. Behavior  is cooperative.     Data Reviewed:  Results are pending, will review when available. EKG: Vent. rate 65 BPM PR interval 389 ms QRS duration 87 ms QT/QTcB 399/415 ms P-R-T axes 73 75 44 Sinus arrhythmia Prolonged PR interval Probable anteroseptal infarct, old  Assessment and Plan: Principal Problem:   Hyponatremia In the setting of high dose furosemide . Admit to SDU/Observation. Begin 0.9% NaCl at 83 mL/h x 24 hours. Hold furosemide  for now. Liberalize diet and sodium intake. Fluid restriction to 1200 mL daily.  Check serum osmolality. Check urine sodium, check urine osmolality. Follow-up sodium level closely. Follow CBC and CMP in a.m. Consult nephrology if no significant improvement by tomorrow.  Active Problems:   Essential hypertension Holding furosemide  as above. Continue amlodipine  10 mg p.o. daily. Is LE edema worsened by amlodipine ? Continue hydralazine  10 mg every 4 hours as needed. Labetalol  10 mg IVP x 1 dose given. - However, heart rate in the 60s and 70s. - Will continue to manage with vasodilators.    Gout Continue allopurinol  100 mg p.o. daily.    CKD (chronic kidney disease),  symptom management only, stage 4 (severe) (HCC) Currently on fluid restriction. Monitor renal function and electrolytes.    Anemia of chronic renal failure Monitor hematocrit hemoglobin. Transfuse as needed. Might benefit from erythropoietin  administration.    Hyperlipidemia Continue atorvastatin  20 mg p.o. daily.     Advance Care Planning:   Code Status: Full Code   Consults:   Family Communication:   Severity of Illness: The appropriate patient status for this patient is INPATIENT. Inpatient status is judged to be reasonable and necessary in order to provide the required intensity of service to ensure the patient's safety. The patient's presenting symptoms, physical exam findings, and initial radiographic and laboratory data in the context of their chronic  comorbidities is felt to place them at high risk for further clinical deterioration. Furthermore, it is not anticipated that the patient will be medically stable for discharge from the hospital within 2 midnights of admission.   * I certify that at the point of admission it is my clinical judgment that the patient will require inpatient hospital care spanning beyond 2 midnights from the point of admission due to high intensity of service, high risk for further deterioration and high frequency of surveillance required.*  Author: Alm Dorn Castor, MD 12/31/2023 11:37 AM  For on call review [email or web address].   This document was prepared using Dragon voice recognition software and may contain some unintended transcription errors.

## 2023-12-31 NOTE — ED Triage Notes (Signed)
 PT coming from home c/o feeling weak and dizzy. Symptoms started this morning when getting up to the bathroom. Pt  hypertensive during triage. Denies pain.Shob.

## 2023-12-31 NOTE — ED Provider Notes (Signed)
 [place] EMERGENCY DEPARTMENT AT [facility] Provider Note   CSN: [id number] Arrival date & time: 12/31/23  9165     Patient presents with: Weakness and Dizziness   Danny Rhodes is a 89 y.o. male.  Presents with episode of feeling lightheaded and weakness.  States that he got up to take a shower and felt subsequently lightheaded.  Patient states he feels like a lot of his symptoms are subsequent resolving at this point in time.  No numbness or ting anywhere.  No vision changes.  No sore throat or aphasia.  No double vision.  Denies any recent falls.  Denies any chest pain or shortness of breath.  No nausea vomit diarrhea.  Otherwise he states he feels in his normal state of health.  Currently feeling better than when he was earlier this morning.  According to family, patient takes 80 mg of Lasix  in the morning.  Recently had this increase in dosing about 2 months ago.  Previous medical history reviewed : Patient was last admitted 2023.  Dislocation of internal right hip prosthesis.  Acute renal failure superimposed on stage IV chronic CKD.  Failure to thrive.      Weakness Associated symptoms: dizziness   Dizziness Associated symptoms: weakness        Prior to Admission medications   Medication Sig Start Date End Date Taking? Authorizing Provider  acetaminophen  (TYLENOL ) 500 MG tablet Take 500-1,000 mg by mouth 2 (two) times daily as needed for mild pain (pain score 1-3) (or headaches).   Yes [provider]  allopurinol  (ZYLOPRIM ) 100 MG tablet Take 100 mg by mouth every morning.   Yes [provider]  amLODipine  (NORVASC ) 10 MG tablet Take 10 mg by mouth in the morning. 01/04/22  Yes [provider]  ascorbic acid (VITAMIN C) 500 MG tablet Take 500 mg by mouth daily.   Yes [provider]  aspirin  81 MG chewable tablet Chew 1 tablet (81 mg total) by mouth 2 (two) times daily. 12/05/21  Yes Vann, Jessica U, DO  atorvastatin   (LIPITOR) 20 MG tablet Take 20 mg by mouth every morning. 09/26/17  Yes [provider]  carvedilol  (COREG ) 6.25 MG tablet Take 6.25 mg by mouth 2 (two) times daily with a meal.   Yes [provider]  dorzolamide  (TRUSOPT ) 2 % ophthalmic solution Place 1 drop into both eyes 2 (two) times daily.   Yes [provider]  Ferrous Sulfate  Dried (HIGH POTENCY IRON) 65 MG TABS Take 130 mg by mouth daily after breakfast.   Yes [provider]  furosemide  (LASIX ) 40 MG tablet Take 80 mg by mouth in the morning.   Yes [provider]  latanoprost  (XALATAN ) 0.005 % ophthalmic solution Place 1 drop into both eyes at bedtime.   Yes [provider]  Misc Natural Products (BEET ROOT) 500 MG CAPS Take 1,000 mg by mouth daily after breakfast.   Yes [provider]  methocarbamol  (ROBAXIN ) 500 MG tablet Take 1 tablet (500 mg total) by mouth every 6 (six) hours as needed for muscle spasms. Patient not taking: Reported on 12/31/2023 12/05/21   Vann, Jessica U, DO  oxyCODONE  (OXY IR/ROXICODONE ) 5 MG immediate release tablet Take 1 tablet (5 mg total) by mouth every 4 (four) hours as needed for moderate pain. Patient not taking: Reported on 12/31/2023 12/05/21   Vann, Jessica U, DO  pantoprazole  (PROTONIX ) 40 MG tablet Take 1 tablet (40 mg total) by mouth daily at 6 (  six) AM. Patient not taking: Reported on 12/31/2023 12/06/21   Vann, Jessica U, DO    Allergies: Patient has no known allergies.    Review of Systems  Neurological:  Positive for dizziness and weakness.    Updated Vital Signs BP (!) 185/83   Pulse 60   Temp 97.6 F (36.4 C) (Oral)   Resp 14   Ht 5' 6 (1.676 m)   Wt 74.8 kg   SpO2 98%   BMI 26.63 kg/m   Physical Exam Vitals and nursing note reviewed.  Constitutional:      General: He is not in acute distress.    Appearance: He is well-developed.  HENT:     Head: Normocephalic and atraumatic.  Eyes:     Conjunctiva/sclera:  Conjunctivae normal.  Cardiovascular:     Rate and Rhythm: Normal rate and regular rhythm.     Heart sounds: No murmur heard. Pulmonary:     Effort: Pulmonary effort is normal. No respiratory distress.     Breath sounds: Normal breath sounds.  Abdominal:     Palpations: Abdomen is soft.     Tenderness: There is no abdominal tenderness.  Musculoskeletal:        General: No swelling.     Cervical back: Neck supple.  Skin:    General: Skin is warm and dry.     Capillary Refill: Capillary refill takes less than 2 seconds.  Neurological:     Mental Status: He is alert.  Psychiatric:        Mood and Affect: Mood normal.     (all labs ordered are listed, but only abnormal results are displayed) Labs Reviewed  COMPREHENSIVE METABOLIC PANEL WITH GFR - Abnormal; Notable for the following components:      Result Value   Sodium 118 (*)    Chloride 90 (*)    BUN 46 (*)    Creatinine, Ser 2.64 (*)    GFR, Estimated 22 (*)    All other components within normal limits  CBC WITH DIFFERENTIAL/PLATELET - Abnormal; Notable for the following components:   RBC 4.02 (*)    Hemoglobin 10.4 (*)    HCT 32.9 (*)    MCH 25.9 (*)    RDW 17.0 (*)    All other components within normal limits  URINALYSIS, ROUTINE W REFLEX MICROSCOPIC - Abnormal; Notable for the following components:   Color, Urine STRAW (*)    Protein, ur >=300 (*)    All other components within normal limits  TSH  MAGNESIUM  CREATININE, URINE, RANDOM  OSMOLALITY  OSMOLALITY, URINE  UREA  NITROGEN, URINE    EKG: EKG Interpretation Date/Time:  Saturday December 31 2023 08:42:56 EDT Ventricular Rate:  65 PR Interval:  389 QRS Duration:  87 QT Interval:  399 QTC Calculation: 415 R Axis:   75  Text Interpretation: Sinus arrhythmia Prolonged PR interval Probable anteroseptal infarct, old Confirmed by Simon Rea [id number] on 12/31/2023 8:57:34 AM  Radiology: No results found.   Procedures   Medications Ordered in the ED   acetaminophen  (TYLENOL ) tablet 650 mg (has no administration in time range)    Or  acetaminophen  (TYLENOL ) suppository 650 mg (has no administration in time range)  ondansetron  (ZOFRAN ) tablet 4 mg (has no administration in time range)    Or  ondansetron  (ZOFRAN ) injection 4 mg (has no administration in time range)  0.9 %  sodium chloride  infusion ( Intravenous New Bag/Given 12/31/23 1214)  hydrALAZINE  (APRESOLINE ) injection 10 mg (10 mg Intravenous Given  12/31/23 1226)  allopurinol  (ZYLOPRIM ) tablet 100 mg (has no administration in time range)  amLODipine  (NORVASC ) tablet 10 mg (has no administration in time range)  atorvastatin  (LIPITOR) tablet 20 mg (has no administration in time range)                      NIH Stroke Scale: 0              Medical Decision Making Amount and/or Complexity of Data Reviewed Labs: ordered.  Risk Decision regarding hospitalization.   Presents with episode of feeling lightheaded and weakness.  States that he got up to take a shower and felt subsequently lightheaded.  Patient states he feels like a lot of his symptoms are subsequent resolving at this point in time.  No numbness or ting anywhere.  No vision changes.  No sore throat or aphasia.  No double vision.  Denies any recent falls.  Denies any chest pain or shortness of breath.  No nausea vomit diarrhea.  Otherwise he states he feels in his normal state of health.  Currently feeling better than when he was earlier this morning.  According to family, patient takes 80 mg of Lasix  in the morning.  Recently had this increase in dosing about 2 months ago.  Previous medical history reviewed : Patient was last admitted 2023.  Dislocation of internal right hip prosthesis.  Acute renal failure superimposed on stage IV chronic CKD.  Failure to thrive.   On exam, patient no acute distress.  Hemodynamically stable.  Slightly hypertensive but otherwise unremarkable vitals.  Presenting room air.   Cranial nerve   2 through 12 intact.  No focal deficits.  No dysarthria or aphasia.  No diplopia.  Strength and sensation intact in bilateral upper lower extremities.  Normal finger-nose.  Normal heel-to-shin.  No Concerns for CVA at this point time.  No indication for CT imaging or further neuroimaging.   Did obtain laboratory workup.  Sodium level 118.  Patient is taken 80 mg of Lasix  on a daily basis.  Likely was driving this process.  Pending serum/urine osm.  Did not give patient any kind of fluid.  No indication for 3% saline given mentation is baseline as well as no seizure activity.   Patient EKG showed sinus rhythm but did have prolonged PR interval.  Patient heart rate would sometimes be in the 40s to 60s.  Denies any, evidence of high degree AV block.  Needs to be continued to be monitored.          Final diagnoses:  Hyponatremia  Near syncope    ED Discharge Orders     None          Simon Lavonia SAILOR, MD 12/31/23 1356

## 2023-12-31 NOTE — Plan of Care (Signed)
 Sodium level increased to 120, MD aware of arrhythmias upon admission to the unit (Afib, 3rd AVB), vital signs stable.

## 2024-01-01 ENCOUNTER — Observation Stay (HOSPITAL_COMMUNITY)

## 2024-01-01 DIAGNOSIS — R55 Syncope and collapse: Secondary | ICD-10-CM | POA: Diagnosis not present

## 2024-01-01 DIAGNOSIS — I4891 Unspecified atrial fibrillation: Secondary | ICD-10-CM | POA: Diagnosis not present

## 2024-01-01 DIAGNOSIS — E871 Hypo-osmolality and hyponatremia: Secondary | ICD-10-CM | POA: Diagnosis not present

## 2024-01-01 LAB — ECHOCARDIOGRAM COMPLETE
AR max vel: 2.8 cm2
AV Area VTI: 2.65 cm2
AV Area mean vel: 2.77 cm2
AV Mean grad: 4.5 mmHg
AV Peak grad: 7.8 mmHg
Ao pk vel: 1.4 m/s
Area-P 1/2: 2.43 cm2
Height: 66 in
S' Lateral: 2.6 cm
Weight: 2458.57 [oz_av]

## 2024-01-01 LAB — COMPREHENSIVE METABOLIC PANEL WITH GFR
ALT: 11 U/L (ref 0–44)
AST: 14 U/L — ABNORMAL LOW (ref 15–41)
Albumin: 2.9 g/dL — ABNORMAL LOW (ref 3.5–5.0)
Alkaline Phosphatase: 67 U/L (ref 38–126)
Anion gap: 7 (ref 5–15)
BUN: 46 mg/dL — ABNORMAL HIGH (ref 8–23)
CO2: 18 mmol/L — ABNORMAL LOW (ref 22–32)
Calcium: 9.1 mg/dL (ref 8.9–10.3)
Chloride: 97 mmol/L — ABNORMAL LOW (ref 98–111)
Creatinine, Ser: 2.58 mg/dL — ABNORMAL HIGH (ref 0.61–1.24)
GFR, Estimated: 22 mL/min — ABNORMAL LOW (ref >60)
Glucose, Bld: 83 mg/dL (ref 70–99)
Potassium: 4 mmol/L (ref 3.5–5.1)
Sodium: 122 mmol/L — ABNORMAL LOW (ref 135–145)
Total Bilirubin: 0.8 mg/dL (ref 0.0–1.2)
Total Protein: 5.6 g/dL — ABNORMAL LOW (ref 6.5–8.1)

## 2024-01-01 LAB — CBC
HCT: 28.4 % — ABNORMAL LOW (ref 39.0–52.0)
Hemoglobin: 9 g/dL — ABNORMAL LOW (ref 13.0–17.0)
MCH: 26.2 pg (ref 26.0–34.0)
MCHC: 31.7 g/dL (ref 30.0–36.0)
MCV: 82.6 fL (ref 80.0–100.0)
Platelets: 249 K/uL (ref 150–400)
RBC: 3.44 MIL/uL — ABNORMAL LOW (ref 4.22–5.81)
RDW: 17 % — ABNORMAL HIGH (ref 11.5–15.5)
WBC: 4.4 K/uL (ref 4.0–10.5)
nRBC: 0 % (ref 0.0–0.2)

## 2024-01-01 MED ORDER — HYDRALAZINE HCL 20 MG/ML IJ SOLN
10.0000 mg | Freq: Once | INTRAMUSCULAR | Status: AC
  Administered 2024-01-01: 10 mg via INTRAVENOUS
  Filled 2024-01-01: qty 1

## 2024-01-01 NOTE — Progress Notes (Signed)
*  PRELIMINARY RESULTS* Echocardiogram 2D Echocardiogram has been performed.  Danny Rhodes 01/01/2024, 9:07 AM

## 2024-01-01 NOTE — Hospital Course (Signed)
 89 y.o. male with medical history significant of CKD, hypertensive retinopathy, glaucoma, anemia of renal disease, osteoarthritis, right hip prosthesis dislocation, dysphagia, failure to thrive, gout, protein calorie malnutrition, hyperlipidemia, transaminitis who presented to the emergency department with generalized weakness and dizziness.  No significant changes in appetite recently.  His furosemide  was increased from 40 mg twice daily to 80 mg twice a day last month.  He has been on a low-sodium diet at home.  He denied fever, chills, rhinorrhea, sore throat, wheezing or hemoptysis.  No chest pain, palpitations, diaphoresis, PND, orthopnea or pitting edema of the lower extremities.  No abdominal pain, nausea, emesis, diarrhea, constipation, melena or hematochezia.  No flank pain, dysuria, frequency or hematuria.  No polyuria, polydipsia, polyphagia or blurred vision.    Lab work: Urinalysis showed greater than 300 mg/dL of protein, but was otherwise unremarkable.  Urine creatinine was normal at 42 mg/dL.  CBC showed white count 4.1, hemoglobin 10.4 g/dL and platelets 721.  Magnesium is 2.2 mg/dL.  Normal TSH level.  CMP showed sodium 118, potassium 4.8, chloride 90 and CO2 22 mmol/L.  BUN was 46 and creatinine 2.64 mg/dL.  Normal glucose, calcium  and LFTs.

## 2024-01-01 NOTE — Progress Notes (Signed)
  Progress Note   Patient: Danny Rhodes FMW:[id number] DOB: 23-Feb-1927 DOA: 12/31/2023     0 DOS: the patient was seen and examined on 01/01/2024   Brief hospital course: 89 y.o. male with medical history significant of CKD, hypertensive retinopathy, glaucoma, anemia of renal disease, osteoarthritis, right hip prosthesis dislocation, dysphagia, failure to thrive, gout, protein calorie malnutrition, hyperlipidemia, transaminitis who presented to the emergency department with generalized weakness and dizziness.  No significant changes in appetite recently.  His furosemide  was increased from 40 mg twice daily to 80 mg twice a day last month.  He has been on a low-sodium diet at home.  He denied fever, chills, rhinorrhea, sore throat, wheezing or hemoptysis.  No chest pain, palpitations, diaphoresis, PND, orthopnea or pitting edema of the lower extremities.  No abdominal pain, nausea, emesis, diarrhea, constipation, melena or hematochezia.  No flank pain, dysuria, frequency or hematuria.  No polyuria, polydipsia, polyphagia or blurred vision.    Lab work: Urinalysis showed greater than 300 mg/dL of protein, but was otherwise unremarkable.  Urine creatinine was normal at 42 mg/dL.  CBC showed white count 4.1, hemoglobin 10.4 g/dL and platelets 721.  Magnesium is 2.2 mg/dL.  Normal TSH level.  CMP showed sodium 118, potassium 4.8, chloride 90 and CO2 22 mmol/L.  BUN was 46 and creatinine 2.64 mg/dL.  Normal glucose, calcium  and LFTs.  Assessment and Plan: Principal Problem:   Hyponatremia Likely in the setting of high dose furosemide . -continuing to hold lasix  for now Hold furosemide  for now. Continue fluid restriction to 1200 mL daily. Sodium w/u is consistent with SIADH Sodium is slowly improving, although still low Recheck bmet in AM   Active Problems:   Essential hypertension Holding furosemide  as above. Continue amlodipine  10 mg p.o. daily. Continue hydralazine  10 mg every 4 hours as  needed. Labetalol  10 mg IVP x 1 dose given.     Gout Continue allopurinol  100 mg p.o. daily.     CKD (chronic kidney disease),  symptom management only, stage 4 (severe) (HCC) Currently on fluid restriction. Monitor renal function and electrolytes.     Anemia of chronic renal failure Monitor hematocrit hemoglobin. Transfuse as needed. Hemodynamically stable     Hyperlipidemia Continue atorvastatin  20 mg p.o. daily.   Subjective: Feeling better, asking about going home  Physical Exam: Vitals:   01/01/24 1200 01/01/24 1300 01/01/24 1400 01/01/24 1500  BP: (!) 177/49 (!) 164/51 (!) 161/48 (!) 170/46  Pulse: 89 83 88 83  Resp: 14 16 15 16   Temp:      TempSrc:      SpO2: 100% 100% 99% 99%  Weight:      Height:       General exam: Awake, laying in bed, in nad Respiratory system: Normal respiratory effort, no wheezing Cardiovascular system: regular rate, s1, s2 Gastrointestinal system: Soft, nondistended, positive BS Central nervous system: CN2-12 grossly intact, strength intact Extremities: Perfused, no clubbing Skin: Normal skin turgor, no notable skin lesions seen Psychiatry: Mood normal // no visual hallucinations   Data Reviewed:  Labs reviewed: Na 122, K 4.0, Cr 2.58   Family Communication: Pt in room, family not at bedside  Disposition: Status is: Observation The patient remains OBS appropriate and will d/c before 2 midnights.  Planned Discharge Destination: Pending PT eval     Author: Garnette Pelt, MD 01/01/2024 3:52 PM  For on call review [email or web address].

## 2024-01-01 NOTE — Care Management Obs Status (Signed)
 MEDICARE OBSERVATION STATUS NOTIFICATION   Patient Details  Name: Danny Rhodes MRN: [id number] Date of Birth: 1927-05-08   Medicare Observation Status Notification Given:  Yes    Sonda Manuella Quill, RN 01/01/2024, 10:25 AM

## 2024-01-01 NOTE — Plan of Care (Signed)
  Problem: Health Behavior/Discharge Planning: Goal: Ability to manage health-related needs will improve Outcome: Progressing   Problem: Clinical Measurements: Goal: Ability to maintain clinical measurements within normal limits will improve Outcome: Progressing Goal: Diagnostic test results will improve Outcome: Progressing Goal: Cardiovascular complication will be avoided Outcome: Progressing   Problem: Nutrition: Goal: Adequate nutrition will be maintained Outcome: Progressing   Problem: Elimination: Goal: Will not experience complications related to urinary retention Outcome: Progressing

## 2024-01-01 NOTE — TOC Initial Note (Addendum)
 Transition of Care [facility]) - Initial/Assessment Note    Patient Details  Name: Danny Rhodes MRN: [id number] Date of Birth: [birth year]  Transition of Care [facility]) CM/SW Contact:    Sonda Manuella Quill, RN Phone Number: 01/01/2024, 10:50 AM  Clinical Narrative:                 Spoke w/ pt in room; pt plans to return home w/ family support; he lives at home w/ Arek Spadafore (wife) [phone] / Dorothe Hopping (dtr) [phone]; his family will provide transportation; pt verified insurance/PCP; he denied SDOH risks; pt says he has walker; pt does not have HH services or home oxygen; awaiting PT/OT evals; TOC will follow.  Expected Discharge Plan: Home/Self Care Barriers to Discharge: Continued Medical Work up   Patient Goals and CMS Choice Patient states their goals for this hospitalization and ongoing recovery are:: home CMS Medicare.gov Compare Post Acute Care list provided to:: Patient   [place] ownership interest in [facility].provided to:: Patient    Expected Discharge Plan and Services   Discharge Planning Services: CM Consult   Living arrangements for the past 2 months: Single Family Home                                      Prior Living Arrangements/Services Living arrangements for the past 2 months: Single Family Home Lives with:: Spouse, Adult Children Patient language and need for interpreter reviewed:: Yes Do you feel safe going back to the place where you live?: Yes      Need for Family Participation in Patient Care: Yes (Comment) Care giver support system in place?: Yes (comment) Current home services: DME (walker) Criminal Activity/Legal Involvement Pertinent to Current Situation/Hospitalization: No - Comment as needed  Activities of Daily Living      Permission Sought/Granted Permission sought to share information with : Case Manager Permission granted to share information with : Yes, Verbal Permission Granted  Share Information  with NAME: Case Manager     Permission granted to share info w Relationship: Koleton Duchemin (wife) [phone] / Dorothe Hopping (dtr) [phone]     Emotional Assessment Appearance:: Appears stated age Attitude/Demeanor/Rapport: Gracious Affect (typically observed): Accepting Orientation: : Oriented to Self, Oriented to Place, Oriented to  Time, Oriented to Situation Alcohol / Substance Use: Not Applicable Psych Involvement: No (comment)  Admission diagnosis:  Hyponatremia [E87.1] Near syncope [R55] Patient Active Problem List   Diagnosis Date Noted   Hyponatremia 12/31/2023   Hyperlipidemia 12/31/2023   Anemia of chronic renal failure 11/14/2023   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, right 12/01/2021   Protein-calorie malnutrition, severe 11/27/2021   Failure to thrive in adult    Leukocytosis 11/25/2021   Acute renal failure superimposed on stage 4 chronic kidney disease (HCC) 11/25/2021   FTT (failure to thrive) in adult 11/25/2021   Dislocation of internal right hip prosthesis, initial encounter (HCC) 11/25/2021   Transaminitis 11/25/2021   Dysphagia 11/25/2021   Essential hypertension 11/25/2021   Gout 11/25/2021   CKD (chronic kidney disease), symptom management only, stage 4 (severe) (HCC) 04/23/2021   History of anemia due to chronic kidney disease 04/23/2021   PCP:  Gerome Brunet, DO Pharmacy:   [facility] [phone] - [place], [place] - [address] AT [facility] [address] [facility] [address] [place] [address] Phone: [phone] Fax:  [phone]     Social Drivers of Health (SDOH) Social History: SDOH Screenings   Food Insecurity: No Food Insecurity (01/01/2024)  Housing: Low Risk  (01/01/2024)  Transportation Needs: No Transportation Needs (01/01/2024)  Utilities: Not At Risk (01/01/2024)  Depression (PHQ2-9): Low Risk  (01/21/2022)  Social Connections: Socially Isolated (12/31/2023)   Tobacco Use: Low Risk  (06/25/2023)   SDOH Interventions: Food Insecurity Interventions: Intervention Not Indicated, Inpatient TOC Housing Interventions: Intervention Not Indicated, Inpatient TOC Transportation Interventions: Intervention Not Indicated, Inpatient TOC Utilities Interventions: Intervention Not Indicated, Inpatient TOC   Readmission Risk Interventions     No data to display

## 2024-01-02 DIAGNOSIS — M109 Gout, unspecified: Secondary | ICD-10-CM | POA: Diagnosis not present

## 2024-01-02 DIAGNOSIS — I4891 Unspecified atrial fibrillation: Secondary | ICD-10-CM | POA: Diagnosis not present

## 2024-01-02 DIAGNOSIS — Z9841 Cataract extraction status, right eye: Secondary | ICD-10-CM | POA: Diagnosis not present

## 2024-01-02 DIAGNOSIS — Z9842 Cataract extraction status, left eye: Secondary | ICD-10-CM | POA: Diagnosis not present

## 2024-01-02 DIAGNOSIS — I130 Hypertensive heart and chronic kidney disease with heart failure and stage 1 through stage 4 chronic kidney disease, or unspecified chronic kidney disease: Secondary | ICD-10-CM | POA: Diagnosis not present

## 2024-01-02 DIAGNOSIS — I10 Essential (primary) hypertension: Secondary | ICD-10-CM | POA: Diagnosis not present

## 2024-01-02 DIAGNOSIS — D631 Anemia in chronic kidney disease: Secondary | ICD-10-CM | POA: Diagnosis not present

## 2024-01-02 DIAGNOSIS — R531 Weakness: Secondary | ICD-10-CM | POA: Diagnosis not present

## 2024-01-02 DIAGNOSIS — Z8249 Family history of ischemic heart disease and other diseases of the circulatory system: Secondary | ICD-10-CM | POA: Diagnosis not present

## 2024-01-02 DIAGNOSIS — Z604 Social exclusion and rejection: Secondary | ICD-10-CM | POA: Diagnosis present

## 2024-01-02 DIAGNOSIS — I5032 Chronic diastolic (congestive) heart failure: Secondary | ICD-10-CM | POA: Diagnosis not present

## 2024-01-02 DIAGNOSIS — N184 Chronic kidney disease, stage 4 (severe): Secondary | ICD-10-CM | POA: Diagnosis not present

## 2024-01-02 DIAGNOSIS — R627 Adult failure to thrive: Secondary | ICD-10-CM | POA: Diagnosis not present

## 2024-01-02 DIAGNOSIS — Z7982 Long term (current) use of aspirin: Secondary | ICD-10-CM | POA: Diagnosis not present

## 2024-01-02 DIAGNOSIS — R55 Syncope and collapse: Secondary | ICD-10-CM | POA: Diagnosis not present

## 2024-01-02 DIAGNOSIS — E871 Hypo-osmolality and hyponatremia: Secondary | ICD-10-CM | POA: Diagnosis not present

## 2024-01-02 DIAGNOSIS — E872 Acidosis, unspecified: Secondary | ICD-10-CM | POA: Diagnosis not present

## 2024-01-02 DIAGNOSIS — Z79899 Other long term (current) drug therapy: Secondary | ICD-10-CM | POA: Diagnosis not present

## 2024-01-02 DIAGNOSIS — Z7901 Long term (current) use of anticoagulants: Secondary | ICD-10-CM | POA: Diagnosis not present

## 2024-01-02 DIAGNOSIS — Z66 Do not resuscitate: Secondary | ICD-10-CM | POA: Diagnosis not present

## 2024-01-02 DIAGNOSIS — E785 Hyperlipidemia, unspecified: Secondary | ICD-10-CM | POA: Diagnosis not present

## 2024-01-02 DIAGNOSIS — D649 Anemia, unspecified: Secondary | ICD-10-CM | POA: Diagnosis not present

## 2024-01-02 LAB — COMPREHENSIVE METABOLIC PANEL WITH GFR
ALT: 9 U/L (ref 0–44)
AST: 15 U/L (ref 15–41)
Albumin: 2.9 g/dL — ABNORMAL LOW (ref 3.5–5.0)
Alkaline Phosphatase: 67 U/L (ref 38–126)
Anion gap: 6 (ref 5–15)
BUN: 49 mg/dL — ABNORMAL HIGH (ref 8–23)
CO2: 18 mmol/L — ABNORMAL LOW (ref 22–32)
Calcium: 9.1 mg/dL (ref 8.9–10.3)
Chloride: 100 mmol/L (ref 98–111)
Creatinine, Ser: 2.79 mg/dL — ABNORMAL HIGH (ref 0.61–1.24)
GFR, Estimated: 20 mL/min — ABNORMAL LOW (ref >60)
Glucose, Bld: 96 mg/dL (ref 70–99)
Potassium: 4 mmol/L (ref 3.5–5.1)
Sodium: 124 mmol/L — ABNORMAL LOW (ref 135–145)
Total Bilirubin: 0.8 mg/dL (ref 0.0–1.2)
Total Protein: 5.8 g/dL — ABNORMAL LOW (ref 6.5–8.1)

## 2024-01-02 LAB — CBC
HCT: 28.8 % — ABNORMAL LOW (ref 39.0–52.0)
Hemoglobin: 8.9 g/dL — ABNORMAL LOW (ref 13.0–17.0)
MCH: 26.4 pg (ref 26.0–34.0)
MCHC: 30.9 g/dL (ref 30.0–36.0)
MCV: 85.5 fL (ref 80.0–100.0)
Platelets: 256 K/uL (ref 150–400)
RBC: 3.37 MIL/uL — ABNORMAL LOW (ref 4.22–5.81)
RDW: 17.4 % — ABNORMAL HIGH (ref 11.5–15.5)
WBC: 5.5 K/uL (ref 4.0–10.5)
nRBC: 0 % (ref 0.0–0.2)

## 2024-01-02 LAB — BASIC METABOLIC PANEL WITH GFR
Anion gap: 9 (ref 5–15)
Anion gap: 9 (ref 5–15)
BUN: 48 mg/dL — ABNORMAL HIGH (ref 8–23)
BUN: 50 mg/dL — ABNORMAL HIGH (ref 8–23)
CO2: 17 mmol/L — ABNORMAL LOW (ref 22–32)
CO2: 17 mmol/L — ABNORMAL LOW (ref 22–32)
Calcium: 9.7 mg/dL (ref 8.9–10.3)
Calcium: 9.7 mg/dL (ref 8.9–10.3)
Chloride: 100 mmol/L (ref 98–111)
Chloride: 98 mmol/L (ref 98–111)
Creatinine, Ser: 2.76 mg/dL — ABNORMAL HIGH (ref 0.61–1.24)
Creatinine, Ser: 2.82 mg/dL — ABNORMAL HIGH (ref 0.61–1.24)
GFR, Estimated: 21 mL/min — ABNORMAL LOW (ref >60)
GFR, Estimated: 20 mL/min — ABNORMAL LOW (ref >60)
Glucose, Bld: 114 mg/dL — ABNORMAL HIGH (ref 70–99)
Glucose, Bld: 124 mg/dL — ABNORMAL HIGH (ref 70–99)
Potassium: 4.3 mmol/L (ref 3.5–5.1)
Potassium: 4.2 mmol/L (ref 3.5–5.1)
Sodium: 126 mmol/L — ABNORMAL LOW (ref 135–145)
Sodium: 124 mmol/L — ABNORMAL LOW (ref 135–145)

## 2024-01-02 MED ORDER — ENSURE PLUS HIGH PROTEIN PO LIQD
237.0000 mL | Freq: Two times a day (BID) | ORAL | Status: DC
  Administered 2024-01-03 – 2024-01-05 (×5): 237 mL via ORAL

## 2024-01-02 MED ORDER — FUROSEMIDE 10 MG/ML IJ SOLN
120.0000 mg | Freq: Four times a day (QID) | INTRAVENOUS | Status: AC
  Administered 2024-01-02 (×2): 120 mg via INTRAVENOUS
  Filled 2024-01-02 (×2): qty 10

## 2024-01-02 NOTE — Progress Notes (Signed)
 Progress Note   Patient: Danny Rhodes FMW:[id number] DOB: 12/14/1926 DOA: 12/31/2023     0 DOS: the patient was seen and examined on 01/02/2024   Brief hospital course: 89 y.o. male with medical history significant of CKD, hypertensive retinopathy, glaucoma, anemia of renal disease, osteoarthritis, right hip prosthesis dislocation, dysphagia, failure to thrive, gout, protein calorie malnutrition, hyperlipidemia, transaminitis who presented to the emergency department with generalized weakness and dizziness.  No significant changes in appetite recently.  His furosemide  was increased from 40 mg twice daily to 80 mg twice a day last month.  He has been on a low-sodium diet at home.  He denied fever, chills, rhinorrhea, sore throat, wheezing or hemoptysis.  No chest pain, palpitations, diaphoresis, PND, orthopnea or pitting edema of the lower extremities.  No abdominal pain, nausea, emesis, diarrhea, constipation, melena or hematochezia.  No flank pain, dysuria, frequency or hematuria.  No polyuria, polydipsia, polyphagia or blurred vision.    Lab work: Urinalysis showed greater than 300 mg/dL of protein, but was otherwise unremarkable.  Urine creatinine was normal at 42 mg/dL.  CBC showed white count 4.1, hemoglobin 10.4 g/dL and platelets 721.  Magnesium is 2.2 mg/dL.  Normal TSH level.  CMP showed sodium 118, potassium 4.8, chloride 90 and CO2 22 mmol/L.  BUN was 46 and creatinine 2.64 mg/dL.  Normal glucose, calcium  and LFTs.  Assessment and Plan: Principal Problem:   Hyponatremia -Initially thought to be related to lasix , which was held -was continued on 1200cc fluid restriction Sodium w/u is consistent with SIADH, however in the setting of vol overload, not diagnostic Sodium is slowly improving with fluid restriction Appreciate input by Nephrology. Rec to continue IV lasix  120mg  x2 doses, then resume oral diuretics tomorrow if Na continues to improve   Active Problems:   Essential  hypertension Holding furosemide  as above. Continued amlodipine  10 mg p.o. daily. Continue hydralazine  10 mg every 4 hours as needed. Labetalol  10 mg IVP x 1 dose given.     Gout Continue allopurinol  100 mg p.o. daily.     CKD (chronic kidney disease),  symptom management only, stage 4 (severe) (HCC) Currently on fluid restriction. Monitor renal function and electrolytes.     Anemia of chronic renal failure Monitor hematocrit hemoglobin. Transfuse as needed. Hemodynamically stable     Hyperlipidemia Continue atorvastatin  20 mg p.o. daily.   Subjective: states feeling better  Physical Exam: Vitals:   01/02/24 0100 01/02/24 0700 01/02/24 0815 01/02/24 1400  BP: (!) 154/64 (!) 160/52 (!) 162/61 (!) 147/75  Pulse: (!) 104 83 (!) 103 94  Resp: 14 15 14    Temp:   98.2 F (36.8 C) (!) 97.5 F (36.4 C)  TempSrc:    Oral  SpO2: 99% 100% 100% 100%  Weight:      Height:       General exam: Conversant, in no acute distress Respiratory system: normal chest rise, clear, no audible wheezing Cardiovascular system: regular rhythm, s1-s2 Gastrointestinal system: Nondistended, nontender, pos BS Central nervous system: No seizures, no tremors Extremities: No cyanosis, no joint deformities, BLE edema Skin: No rashes, no pallor Psychiatry: Affect normal // no auditory hallucinations   Data Reviewed:  Labs reviewed: Na 126, K 4.3, Cr 2.76, WBC 5.5, Hgb 8.9, Plts 256   Family Communication: Pt in room, family at bedside  Disposition: Status is: Observation The patient remains OBS appropriate and will d/c before 2 midnights.  Planned Discharge Destination: Home     Author: Garnette Pelt, MD 01/02/2024  3:05 PM  For on call review [email or web address].

## 2024-01-02 NOTE — Consult Note (Signed)
 Nephrology Consult   Requesting provider: Garnette Pelt Service requesting consult: Hospitalist Reason for consult: Hyponatremia   Assessment/Recommendations: Danny Rhodes is a/an 89 y.o. male with a past medical history CKD 4, HTN, HLD who present w/ hyponatremia  Symptomatic hyponatremia: Generalized symptoms.  Has improved with volume restriction.  Still some signs of volume overload.  Cannot interpret urine lites in the setting of volume excess given that this is most likely cause of the low sodium.  Continue with volume restriction.  IV Lasix  120 mg today for 2 doses and likely resume oral diuretics tomorrow if sodium continues to improve.  CKD4: Follows with Dr. Marla. Creatinine around 2.5-3.  This is the patient's baseline.  No concerns at this time likely will rise slightly as we optimize his volume status -Continue to monitor daily Cr, Dose meds for GFR -Monitor Daily I/Os, Daily weight  -Maintain MAP>65 for optimal renal perfusion.  -Avoid nephrotoxic medications including NSAIDs -Use synthetic opioids (Fentanyl /Dilaudid ) if needed -Currently no indication for HD   Chronic diastolic heart failure: diastolic with moderate elevation in pulmonary pressures on Echo. Diuretics as above  Metabolic acidosis: Bicarb mildly decreased at 17.  Continue to monitor  Anemia: Likely multifactorial.  CKD contributing.  Continue to monitor   Recommendations conveyed to primary service.    [name] Player [facility] 01/02/2024 11:53 AM   _____________________________________________________________________________________ CC: Weakness and dizziness  History of Present Illness: Danny Rhodes is a/an 89 y.o. male with a past medical history of CKD 4, HTN, HLD who presents with weakness and dizziness.  Patient presented to the hospital 2 days ago with worsening weakness and dizziness that was generalized.  Feels like his appetite has been about the same.  His Lasix   was increased in the outpatient setting from 40 mg to 80 mg recently.  He does feel like it has helped with his lower extremity edema.  Prior to arrival he denied fevers, chills, chest pain, nausea, vomiting, diarrhea.  He has chronic right lower extremity edema which remains present but is overall improved.  No problems urinating and denies hematuria.  In the emergency department creatinine was at baseline around 2.6 with sodium of 118.  Since arrival to patient's sodium is steadily improved up to 126 today.  Patient states he overall feels much better.   Medications:  Current Facility-Administered Medications  Medication Dose Route Frequency Provider Last Rate Last Admin   acetaminophen  (TYLENOL ) tablet 650 mg  650 mg Oral Q6H PRN Celinda Alm Lot, MD       Or   acetaminophen  (TYLENOL ) suppository 650 mg  650 mg Rectal Q6H PRN Celinda Alm Lot, MD       allopurinol  (ZYLOPRIM ) tablet 100 mg  100 mg Oral q morning Celinda Alm Lot, MD   100 mg at 01/02/24 1040   amLODipine  (NORVASC ) tablet 10 mg  10 mg Oral Daily Celinda Alm Lot, MD   10 mg at 01/02/24 1040   atorvastatin  (LIPITOR) tablet 20 mg  20 mg Oral Daily Celinda Alm Lot, MD   20 mg at 01/02/24 1040   atropine  1 MG/10ML injection 0.5 mg  0.5 mg Intravenous PRN Celinda Alm Lot, MD       Chlorhexidine  Gluconate Cloth 2 % PADS 6 each  6 each Topical Daily Celinda Alm Lot, MD   6 each at 01/01/24 1532   furosemide  (LASIX ) 120 mg in dextrose  5 % 50 mL IVPB  120 mg Intravenous Q6H Player [name], MD  hydrALAZINE  (APRESOLINE ) injection 10 mg  10 mg Intravenous Q4H PRN Celinda Alm Lot, MD   10 mg at 01/01/24 1328   latanoprost  (XALATAN ) 0.005 % ophthalmic solution 1 drop  1 drop Both Eyes QHS Chavez, Abigail, NP   1 drop at 01/01/24 2216   ondansetron  (ZOFRAN ) tablet 4 mg  4 mg Oral Q6H PRN Celinda Alm Lot, MD       Or   ondansetron  (ZOFRAN ) injection 4 mg  4 mg Intravenous Q6H PRN Celinda Alm Lot, MD        Oral care mouth rinse  15 mL Mouth Rinse PRN Celinda Alm Lot, MD         ALLERGIES Patient has no known allergies.  MEDICAL HISTORY Past Medical History:  Diagnosis Date   CKD (chronic kidney disease), symptom management only, stage 4 (severe) (HCC)    Glaucoma    POAG OU   History of anemia due to chronic kidney disease    Hypertensive retinopathy    OU     SOCIAL HISTORY Social History   Socioeconomic History   Marital status: Married    Spouse name: Not on file   Number of children: Not on file   Years of education: Not on file   Highest education level: Not on file  Occupational History   Not on file  Tobacco Use   Smoking status: Never   Smokeless tobacco: Never  Vaping Use   Vaping status: Never Used  Substance and Sexual Activity   Alcohol use: Not Currently   Drug use: Never   Sexual activity: Not Currently  Other Topics Concern   Not on file  Social History Narrative   Not on file   Social Drivers of Health   Financial Resource Strain: Not on file  Food Insecurity: No Food Insecurity (01/01/2024)   Hunger Vital Sign    Worried About Running Out of Food in the Last Year: Never true    Ran Out of Food in the Last Year: Never true  Transportation Needs: No Transportation Needs (01/01/2024)   PRAPARE - Administrator, Civil Service (Medical): No    Lack of Transportation (Non-Medical): No  Physical Activity: Not on file  Stress: Not on file  Social Connections: Socially Isolated (12/31/2023)   Social Connection and Isolation Panel    Frequency of Communication with Friends and Family: Once a week    Frequency of Social Gatherings with Friends and Family: Once a week    Attends Religious Services: Never    Database administrator or Organizations: No    Attends Banker Meetings: Never    Marital Status: Married  Catering manager Violence: Not At Risk (01/01/2024)   Humiliation, Afraid, Rape, and Kick questionnaire    Fear  of Current or Ex-Partner: No    Emotionally Abused: No    Physically Abused: No    Sexually Abused: No     FAMILY HISTORY No family history on file.  Father has a history of CAD died from myocardial infarction   Review of Systems: 12 systems reviewed Otherwise as per HPI, all other systems reviewed and negative  Physical Exam: Vitals:   01/02/24 0700 01/02/24 0815  BP: (!) 160/52 (!) 162/61  Pulse: 83 (!) 103  Resp: 15 14  Temp:  98.2 F (36.8 C)  SpO2: 100% 100%   Total I/O In: 240 [P.O.:240] Out: -   Intake/Output Summary (Last 24 hours) at 01/02/2024 1153 Last data filed  at 01/02/2024 0900 Gross per 24 hour  Intake 853.86 ml  Output 1150 ml  Net -296.14 ml   General: Elderly, pleasant, no apparent distress HEENT: anicteric sclera, oropharynx clear without lesions CV: Normal rate, no rub, trace upper extremity edema and 1+ lower extremity edema on the right leg Lungs: clear to auscultation bilaterally, normal work of breathing Abd: soft, non-tender, non-distended Skin: no visible lesions or rashes Psych: alert, engaged, appropriate mood and affect Musculoskeletal: Mild foot deformities bilaterally, otherwise no significant abnormalities Neuro: normal speech, no gross focal deficits   Test Results Reviewed Lab Results  Component Value Date   NA 126 (L) 01/02/2024   K 4.3 01/02/2024   CL 100 01/02/2024   CO2 17 (L) 01/02/2024   BUN 48 (H) 01/02/2024   CREATININE 2.76 (H) 01/02/2024   CALCIUM  9.7 01/02/2024   ALBUMIN 2.9 (L) 01/02/2024   PHOS 3.8 12/13/2023    CBC Recent Labs  Lab 12/31/23 0949 01/01/24 0304 01/02/24 0047  WBC 4.1 4.4 5.5  NEUTROABS 2.7  --   --   HGB 10.4* 9.0* 8.9*  HCT 32.9* 28.4* 28.8*  MCV 81.8 82.6 85.5  PLT 278 249 256    I have reviewed all relevant outside healthcare records related to the patient's current hospitalization

## 2024-01-02 NOTE — Plan of Care (Signed)
  Problem: Health Behavior/Discharge Planning: Goal: Ability to manage health-related needs will improve Outcome: Progressing   Problem: Clinical Measurements: Goal: Ability to maintain clinical measurements within normal limits will improve Outcome: Progressing Goal: Will remain free from infection Outcome: Progressing Goal: Diagnostic test results will improve Outcome: Progressing Goal: Cardiovascular complication will be avoided Outcome: Progressing   Problem: Nutrition: Goal: Adequate nutrition will be maintained Outcome: Progressing   Problem: Elimination: Goal: Will not experience complications related to bowel motility Outcome: Progressing Goal: Will not experience complications related to urinary retention Outcome: Progressing

## 2024-01-02 NOTE — Evaluation (Signed)
 Occupational Therapy Evaluation Patient Details Name: Danny Rhodes MRN: [id number] DOB: 1927/01/09 Today's Date: 01/02/2024   History of Present Illness   Danny Rhodes is a 89 yo male admitted to [facility] for hyponatremia on 12/31/23. PMH: CKD, hypertensive retinopathy, glaucoma, anemia of renal disease, osteoarthritis, right hip prosthesis dislocation, dysphagia, failure to thrive, gout, protein calorie malnutrition, hyperlipidemia, transaminitis     Clinical Impressions PTA, patient lives at home with family and uses RW at baseline and was mod I for BADL's and mobility with family support for higher level IADL's. Currently, patient presents with deficits outlined below (see OT Problem List for details) most significantly decreased activity tolerance and balance impacting BADL's and functional mobility. Patient is close to baseline level and would benefit from sock aide training to progress LB mod I. Patient requires continued Acute care hospital level OT services to progress safety and functional performance and allow for discharge and anticipate home with family without OT follow up needs at discharge.       If plan is discharge home, recommend the following:   A little help with walking and/or transfers;A little help with bathing/dressing/bathroom;Assistance with cooking/housework     Functional Status Assessment   Patient has had a recent decline in their functional status and demonstrates the ability to make significant improvements in function in a reasonable and predictable amount of time.     Equipment Recommendations   None recommended by OT;Other (comment) (OT to provide info for sock aide)      Precautions/Restrictions   Precautions Precautions: None Restrictions Weight Bearing Restrictions Per Provider Order: No     Mobility Bed Mobility Overal bed mobility:  (patient up in recliner and remained)                  Transfers Overall transfer level:  Needs assistance Equipment used: Rolling walker (2 wheels) Transfers: Sit to/from Stand, Bed to chair/wheelchair/BSC Sit to Stand: Contact guard assist     Step pivot transfers: Contact guard assist     General transfer comment: min cues for hand placement, limited distance in hallway due to fatigue      Balance Overall balance assessment: Mild deficits observed, not formally tested                                         ADL either performed or assessed with clinical judgement   ADL Overall ADL's : Needs assistance/impaired Eating/Feeding: Independent;Sitting   Grooming: Wash/dry hands;Wash/dry face;Sitting;Modified independent   Upper Body Bathing: Modified independent;Sitting   Lower Body Bathing: Minimal assistance;Sit to/from stand   Upper Body Dressing : Modified independent;Sitting   Lower Body Dressing: Minimal assistance;Sit to/from stand Lower Body Dressing Details (indicate cue type and reason): decreased functional reach tp R LE Toilet Transfer: Contact guard assist;Cueing for sequencing;Rolling walker (2 wheels)           Functional mobility during ADLs: Contact guard assist;Rolling walker (2 wheels)       Vision Baseline Vision/History: 0 No visual deficits Vision Assessment?: No apparent visual deficits            Pertinent Vitals/Pain Pain Assessment Pain Assessment: Faces Faces Pain Scale: Hurts a little bit Pain Location: R hip Pain Descriptors / Indicators: Guarding Pain Intervention(s): Monitored during session, Repositioned     Extremity/Trunk Assessment Upper Extremity Assessment Upper Extremity Assessment: Right hand dominant;Overall [facility] for tasks assessed  Lower Extremity Assessment Lower Extremity Assessment: Defer to PT evaluation   Cervical / Trunk Assessment Cervical / Trunk Assessment: Other exceptions (hip flexion in standing)   Communication Communication Communication: Impaired Factors Affecting  Communication: Hearing impaired   Cognition Arousal: Alert Behavior During Therapy: [name] for tasks assessed/performed Cognition: No apparent impairments                               Following commands: Intact       Cueing  General Comments   Cueing Techniques: Verbal cues  mild SOB post amb but recovered with min cues for breathing integration HR to 108 max           Home Living Family/patient expects to be discharged to:: Private residence Living Arrangements: Children;Spouse/significant other Available Help at Discharge: Family;Available 24 hours/day Type of Home: House Home Access: Ramped entrance     Home Layout: One level     Bathroom Shower/Tub: Producer, television/film/video: Handicapped height Bathroom Accessibility: Yes How Accessible: Accessible via walker Home Equipment: Rolling Walker (2 wheels);Rollator (4 wheels);Shower seat;Adaptive equipment;Hand held shower head;Grab bars - tub/shower;Cane - single point Adaptive Equipment: Long-handled sponge        Prior Functioning/Environment Prior Level of Function : Needs assist       Physical Assist : Mobility (physical) Mobility (physical): Gait   Mobility Comments: uses RW in home/short community with inability to amb longer distances ie grocery store ADLs Comments: family assist with cooking, patient mod I with light snack prep but all other BADL's    OT Problem List: Impaired balance (sitting and/or standing);Decreased activity tolerance;Increased edema   OT Treatment/Interventions: Self-care/ADL training;Therapeutic exercise;Energy conservation;DME and/or AE instruction;Therapeutic activities;Balance training;Patient/family education      OT Goals(Current goals can be found in the care plan section)   Acute Rehab OT Goals Patient Stated Goal: to go home OT Goal Formulation: With patient/family Time For Goal Achievement: 01/16/24 Potential to Achieve Goals: Good ADL  Goals Pt Will Perform Lower Body Bathing: with adaptive equipment;with modified independence;sit to/from stand Pt Will Perform Lower Body Dressing: with adaptive equipment;with modified independence;sit to/from stand Pt Will Transfer to Toilet: with modified independence;ambulating;grab bars;regular height toilet Additional ADL Goal #1: Patient will use AE and 5 5s with min cues for ADL's   OT Frequency:  Min 2X/week    Co-evaluation PT/OT/SLP Co-Evaluation/Treatment: Yes Reason for Co-Treatment: To address functional/ADL transfers;Complexity of the patient's impairments (multi-system involvement) PT goals addressed during session: Balance;Mobility/safety with mobility;Proper use of DME OT goals addressed during session: ADL's and self-care;Proper use of Adaptive equipment and DME      AM-PAC OT 6 Clicks Daily Activity     Outcome Measure Help from another person eating meals?: None Help from another person taking care of personal grooming?: None Help from another person toileting, which includes using toliet, bedpan, or urinal?: A Little Help from another person bathing (including washing, rinsing, drying)?: A Little Help from another person to put on and taking off regular upper body clothing?: None Help from another person to put on and taking off regular lower body clothing?: None 6 Click Score: 22   End of Session Equipment Utilized During Treatment: Gait belt;Rolling walker (2 wheels) Nurse Communication: Mobility status  Activity Tolerance: Patient limited by fatigue Patient left: in chair;with call bell/phone within reach;with chair alarm set;with family/visitor present  OT Visit Diagnosis: Unsteadiness on feet (R26.81)  Time: 8884-8864 OT Time Calculation (min): 20 min Charges:  OT Evaluation $OT Eval Low Complexity: 1 Low  Deseree Zemaitis OT/L Acute Rehabilitation Department  [phone]  01/02/2024, 11:55 AM

## 2024-01-02 NOTE — Evaluation (Signed)
 Physical Therapy Evaluation Patient Details Name: Danny Rhodes MRN: [id number] DOB: Apr 15, 1927 Today's Date: 01/02/2024  History of Present Illness  Danny Rhodes is a 89 yo male admitted to [facility] for hyponatremia on 12/31/23. PMH: CKD, hypertensive retinopathy, glaucoma, anemia of renal disease, osteoarthritis, right hip prosthesis dislocation, dysphagia, failure to thrive, gout, protein calorie malnutrition, hyperlipidemia, transaminitis  Clinical Impression  Pt admitted with above diagnosis. At baseline, pt resides with family and is ambulatory for short community distances with a RW.  Today, pt ambulating 11' with RW and CGA.  Min cues for safety.  Reports fatigued more than baseline.  Will benefit from acute PT but anticipate no further PT needs at d/c.  Pt currently with functional limitations due to the deficits listed below (see PT Problem List). Pt will benefit from acute skilled PT to increase their independence and safety with mobility to allow discharge.           If plan is discharge home, recommend the following: Assistance with cooking/housework;Help with stairs or ramp for entrance   Can travel by private vehicle        Equipment Recommendations None recommended by PT  Recommendations for Other Services       Functional Status Assessment Patient has had a recent decline in their functional status and demonstrates the ability to make significant improvements in function in a reasonable and predictable amount of time.     Precautions / Restrictions Precautions Precautions: Fall      Mobility  Bed Mobility               General bed mobility comments: in recliner at arrival    Transfers Overall transfer level: Needs assistance Equipment used: Rolling walker (2 wheels) Transfers: Sit to/from Stand, Bed to chair/wheelchair/BSC Sit to Stand: Contact guard assist           General transfer comment: min cues for hand placement     Ambulation/Gait Ambulation/Gait assistance: Contact guard assist Gait Distance (Feet): 75 Feet Assistive device: Rolling walker (2 wheels) Gait Pattern/deviations: Step-through pattern, Decreased stride length, Decreased weight shift to right Gait velocity: decreased but functional     General Gait Details: leg length discrepancy on R (R shorter) leading to decreased weight shift to R and vaulting; reports fatigued slightly easier than baseline  Stairs            Wheelchair Mobility     Tilt Bed    Modified Rankin (Stroke Patients Only)       Balance Overall balance assessment: Needs assistance Sitting-balance support: No upper extremity supported Sitting balance-[name]: Good     Standing balance support: Bilateral upper extremity supported Standing balance-[name]: Poor Standing balance comment: RW to ambulate- CGA                             Pertinent Vitals/Pain Pain Assessment Faces Pain Scale: Hurts a little bit Pain Location: R hip Pain Descriptors / Indicators: Guarding Pain Intervention(s): Limited activity within patient's tolerance, Monitored during session, Repositioned    Home Living Family/patient expects to be discharged to:: Private residence Living Arrangements: Children;Spouse/significant other Available Help at Discharge: Family;Available 24 hours/day Type of Home: House Home Access: Ramped entrance       Home Layout: One level Home Equipment: Agricultural consultant (2 wheels);Rollator (4 wheels);Shower seat;Adaptive equipment;Hand held shower head;Grab bars - tub/shower;Cane - single point      Prior Function Prior Level of Function :  Needs assist       Physical Assist : Mobility (physical) Mobility (physical): Gait   Mobility Comments: uses RW in home/short community with inability to amb longer distances ie grocery store ADLs Comments: family assist with cooking, patient mod I with light snack prep but all other  BADL's     Extremity/Trunk Assessment   Upper Extremity Assessment Upper Extremity Assessment: Right hand dominant;Overall [name] for tasks assessed    Lower Extremity Assessment Lower Extremity Assessment: Defer to PT evaluation    Cervical / Trunk Assessment Cervical / Trunk Assessment: Other exceptions (hip flexion in standing)  Communication   Communication Communication: Impaired Factors Affecting Communication: Hearing impaired    Cognition Arousal: Alert Behavior During Therapy: [name] for tasks assessed/performed   PT - Cognitive impairments: No apparent impairments                         Following commands: Intact       Cueing Cueing Techniques: Verbal cues     General Comments General comments (skin integrity, edema, etc.): VSS on RA    Exercises     Assessment/Plan    PT Assessment Patient needs continued PT services  PT Problem List Cardiopulmonary status limiting activity;Decreased activity tolerance;Decreased balance;Decreased mobility;Decreased knowledge of use of DME       PT Treatment Interventions DME instruction;Therapeutic exercise;Gait training;Functional mobility training;Therapeutic activities;Patient/family education;Balance training    PT Goals (Current goals can be found in the Care Plan section)  Acute Rehab PT Goals Patient Stated Goal: return home PT Goal Formulation: With patient/family Time For Goal Achievement: 01/16/24 Potential to Achieve Goals: Good    Frequency Min 2X/week     Co-evaluation   Reason for Co-Treatment: To address functional/ADL transfers;Complexity of the patient's impairments (multi-system involvement) PT goals addressed during session: Balance;Mobility/safety with mobility;Proper use of DME OT goals addressed during session: ADL's and self-care;Proper use of Adaptive equipment and DME       AM-PAC PT 6 Clicks Mobility  Outcome Measure Help needed turning from your back to your side while  in a flat bed without using bedrails?: None Help needed moving from lying on your back to sitting on the side of a flat bed without using bedrails?: A Little Help needed moving to and from a bed to a chair (including a wheelchair)?: A Little Help needed standing up from a chair using your arms (e.g., wheelchair or bedside chair)?: A Little Help needed to walk in hospital room?: A Little Help needed climbing 3-5 steps with a railing? : A Little 6 Click Score: 19    End of Session Equipment Utilized During Treatment: Gait belt Activity Tolerance: Patient tolerated treatment well Patient left: with chair alarm set;in chair;with call bell/phone within reach;with family/visitor present Nurse Communication: Mobility status PT Visit Diagnosis: Other abnormalities of gait and mobility (R26.89)    Time: 8884-8863 PT Time Calculation (min) (ACUTE ONLY): 21 min   Charges:   PT Evaluation $PT Eval Low Complexity: 1 Low   PT General Charges $$ ACUTE PT VISIT: 1 Visit         Benjiman, PT Acute Rehab Services [facility] [phone]   Benjiman VEAR Mulberry 01/02/2024, 12:06 PM

## 2024-01-03 DIAGNOSIS — E871 Hypo-osmolality and hyponatremia: Secondary | ICD-10-CM | POA: Diagnosis not present

## 2024-01-03 DIAGNOSIS — R55 Syncope and collapse: Secondary | ICD-10-CM | POA: Diagnosis not present

## 2024-01-03 LAB — RENAL FUNCTION PANEL
Albumin: 3 g/dL — ABNORMAL LOW (ref 3.5–5.0)
Anion gap: 5 (ref 5–15)
BUN: 49 mg/dL — ABNORMAL HIGH (ref 8–23)
CO2: 21 mmol/L — ABNORMAL LOW (ref 22–32)
Calcium: 9.2 mg/dL (ref 8.9–10.3)
Chloride: 101 mmol/L (ref 98–111)
Creatinine, Ser: 2.77 mg/dL — ABNORMAL HIGH (ref 0.61–1.24)
GFR, Estimated: 21 mL/min — ABNORMAL LOW (ref >60)
Glucose, Bld: 125 mg/dL — ABNORMAL HIGH (ref 70–99)
Phosphorus: 3.6 mg/dL (ref 2.5–4.6)
Potassium: 3.7 mmol/L (ref 3.5–5.1)
Sodium: 127 mmol/L — ABNORMAL LOW (ref 135–145)

## 2024-01-03 LAB — UREA NITROGEN, URINE: Urea Nitrogen, Ur: 231 mg/dL

## 2024-01-03 MED ORDER — LABETALOL HCL 5 MG/ML IV SOLN
5.0000 mg | INTRAVENOUS | Status: DC | PRN
  Administered 2024-01-03: 5 mg via INTRAVENOUS
  Filled 2024-01-03: qty 4

## 2024-01-03 MED ORDER — APIXABAN 2.5 MG PO TABS
2.5000 mg | ORAL_TABLET | Freq: Two times a day (BID) | ORAL | Status: DC
  Administered 2024-01-03 – 2024-01-05 (×5): 2.5 mg via ORAL
  Filled 2024-01-03 (×5): qty 1

## 2024-01-03 MED ORDER — METOPROLOL TARTRATE 25 MG PO TABS
12.5000 mg | ORAL_TABLET | Freq: Two times a day (BID) | ORAL | Status: DC
  Administered 2024-01-03 – 2024-01-05 (×5): 12.5 mg via ORAL
  Filled 2024-01-03 (×5): qty 1

## 2024-01-03 MED ORDER — FUROSEMIDE 10 MG/ML IJ SOLN
160.0000 mg | Freq: Four times a day (QID) | INTRAVENOUS | Status: AC
  Administered 2024-01-03 (×2): 160 mg via INTRAVENOUS
  Filled 2024-01-03 (×2): qty 10

## 2024-01-03 NOTE — Progress Notes (Signed)
 Progress Note   Patient: Danny Rhodes FMW:[id number] DOB: 1927-01-07 DOA: 12/31/2023     1 DOS: the patient was seen and examined on 01/03/2024   Brief hospital course: 89 y.o. male with medical history significant of CKD, hypertensive retinopathy, glaucoma, anemia of renal disease, osteoarthritis, right hip prosthesis dislocation, dysphagia, failure to thrive, gout, protein calorie malnutrition, hyperlipidemia, transaminitis who presented to the emergency department with generalized weakness and dizziness.  No significant changes in appetite recently.  His furosemide  was increased from 40 mg twice daily to 80 mg twice a day last month.  He has been on a low-sodium diet at home.  He denied fever, chills, rhinorrhea, sore throat, wheezing or hemoptysis.  No chest pain, palpitations, diaphoresis, PND, orthopnea or pitting edema of the lower extremities.  No abdominal pain, nausea, emesis, diarrhea, constipation, melena or hematochezia.  No flank pain, dysuria, frequency or hematuria.  No polyuria, polydipsia, polyphagia or blurred vision.    Lab work: Urinalysis showed greater than 300 mg/dL of protein, but was otherwise unremarkable.  Urine creatinine was normal at 42 mg/dL.  CBC showed white count 4.1, hemoglobin 10.4 g/dL and platelets 721.  Magnesium is 2.2 mg/dL.  Normal TSH level.  CMP showed sodium 118, potassium 4.8, chloride 90 and CO2 22 mmol/L.  BUN was 46 and creatinine 2.64 mg/dL.  Normal glucose, calcium  and LFTs.  Assessment and Plan: Principal Problem:   Hyponatremia -Initially thought to be related to lasix , which was held -was continued on 1200cc fluid restriction Sodium w/u was thought to be c/w SIADH, however in the setting of vol overload, not diagnostic Sodium is slowly improving with fluid restriction Appreciate input by Nephrology. Rec to continue IV lasix  120mg  x2 doses more doses, then resume oral diuretics as able   Active Problems:   Essential hypertension Continued  amlodipine  10 mg p.o. daily. Continue hydralazine  10 mg every 4 hours as needed.\ Continued lasix  per above     Gout Continue allopurinol  100 mg p.o. daily.     CKD (chronic kidney disease),  symptom management only, stage 4 (severe) (HCC) Currently on fluid restriction. Monitor renal function and electrolytes.     Anemia of chronic renal failure Monitor hematocrit hemoglobin. Transfuse as needed. Hemodynamically stable     Hyperlipidemia Continue atorvastatin  20 mg p.o. daily.  Afib, new diagnosis, with RVR -Tele reviewed. Confirms new afib -Recent 2d echo reviewed. Valves largely unremarkable. Normal LVEF -TSH normal -HR improved with addition of  low dose beta blocker, will continue -Contacted Cardiology to arrange outpt Afib Clinic visit. Appointment in [facility] -[facility]  at least 3 (Age, HTN) -ordered eliquis    Subjective:Reports feeling better. Eager to go home soon  Physical Exam: Vitals:   01/03/24 1210 01/03/24 1219 01/03/24 1336 01/03/24 1359  BP: (!) 144/75 124/73 123/60 123/60  Pulse:    77  Resp: 18 18 16    Temp:   98 F (36.7 C)   TempSrc:   Oral   SpO2:   100%   Weight:      Height:       General exam: Conversant, in no acute distress Respiratory system: normal chest rise, clear, no audible wheezing Cardiovascular system: irregularly irregular, s1-s2 Gastrointestinal system: Nondistended, nontender, pos BS Central nervous system: No seizures, no tremors Extremities: No cyanosis, no joint deformities, BLE edema Skin: No rashes, no pallor Psychiatry: Affect normal // no auditory hallucinations   Data Reviewed:  Labs reviewed: Na 127, K 3.7, Cr 2.77   Family Communication: Pt  in room, family at bedside  Disposition: Status is: Inpatient The patient will require care spanning > 2 midnights and should be moved to inpatient because: severity of illness  Planned Discharge Destination: Home     Author: Garnette Pelt, MD 01/03/2024 5:45 PM  For  on call review [email or web address].

## 2024-01-03 NOTE — Progress Notes (Signed)
 Nephrology Follow-Up Consult note   Assessment/Recommendations: Danny Rhodes is a/an 89 y.o. male with a past medical history CKD 4, HTN, HLD who present w/ hyponatremia   Symptomatic hyponatremia: Generalized symptoms.  Has improved with volume restriction.  Still some signs of volume overload.  Cannot interpret urine lytes in the setting of volume excess given that this is most likely cause of the low sodium.  Continue with volume restriction.  Seems like response to Lasix  was muted yesterday.  Increase to IV Lasix  160 mg x 2 doses.  Resume oral diuretics as able.  Continue to follow sodium daily.   CKD4: Follows with Dr. Marla. Creatinine around 2.5-3.  This is the patient's baseline.  No concerns at this time likely will rise slightly as we optimize his volume status -Continue to monitor daily Cr, Dose meds for GFR -Monitor Daily I/Os, Daily weight  -Maintain MAP>65 for optimal renal perfusion.  -Avoid nephrotoxic medications including NSAIDs -Use synthetic opioids (Fentanyl /Dilaudid ) if needed -Currently no indication for HD     Chronic diastolic heart failure: diastolic with moderate elevation in pulmonary pressures on Echo. Diuretics as above   Metabolic acidosis: Bicarb mildly decreased at 17.  Continue to monitor   Anemia: Likely multifactorial.  CKD contributing.  Continue to monitor  Atrial fibrillation: Management per primary team   Recommendations conveyed to primary service.    [name] Player [facility] 01/03/2024 12:59 PM  ___________________________________________________________  CC: Weakness and dizziness  Interval History/Subjective: Patient states he feels well.  Feels like he urinated a lot.  Still with some edema.  Had atrial fibrillation  Medications:  Current Facility-Administered Medications  Medication Dose Route Frequency Provider Last Rate Last Admin   acetaminophen  (TYLENOL ) tablet 650 mg  650 mg Oral Q6H PRN Cindy Garnette POUR,  MD       Or   acetaminophen  (TYLENOL ) suppository 650 mg  650 mg Rectal Q6H PRN Cindy Garnette POUR, MD       allopurinol  (ZYLOPRIM ) tablet 100 mg  100 mg Oral q morning Cindy Garnette POUR, MD   100 mg at 01/03/24 0908   amLODipine  (NORVASC ) tablet 10 mg  10 mg Oral Daily Cindy Garnette POUR, MD   10 mg at 01/03/24 0908   atorvastatin  (LIPITOR) tablet 20 mg  20 mg Oral Daily Cindy Garnette POUR, MD   20 mg at 01/03/24 9091   atropine  1 MG/10ML injection 0.5 mg  0.5 mg Intravenous PRN Cindy Garnette POUR, MD       Chlorhexidine  Gluconate Cloth 2 % PADS 6 each  6 each Topical Daily Cindy Garnette POUR, MD   6 each at 01/01/24 1532   feeding supplement (ENSURE PLUS HIGH PROTEIN) liquid 237 mL  237 mL Oral BID BM Cindy Garnette POUR, MD   237 mL at 01/03/24 0908   furosemide  (LASIX ) 160 mg in dextrose  5 % 50 mL IVPB  160 mg Intravenous Q6H Player [name], MD 66 mL/hr at 01/03/24 0842 160 mg at 01/03/24 0842   hydrALAZINE  (APRESOLINE ) injection 10 mg  10 mg Intravenous Q4H PRN Cindy Garnette POUR, MD   10 mg at 01/01/24 1328   labetalol  (NORMODYNE ) injection 5 mg  5 mg Intravenous Q2H PRN Cindy Garnette POUR, MD   5 mg at 01/03/24 1210   latanoprost  (XALATAN ) 0.005 % ophthalmic solution 1 drop  1 drop Both Eyes QHS Cindy Garnette POUR, MD   1 drop at 01/02/24 2146   metoprolol  tartrate (LOPRESSOR ) tablet 12.5 mg  12.5 mg Oral BID Cindy Garnette POUR, MD       ondansetron  (ZOFRAN ) tablet 4 mg  4 mg Oral Q6H PRN Cindy Garnette POUR, MD       Or   ondansetron  (ZOFRAN ) injection 4 mg  4 mg Intravenous Q6H PRN Cindy Garnette POUR, MD       Oral care mouth rinse  15 mL Mouth Rinse PRN Cindy Garnette POUR, MD          Review of Systems: 10 systems reviewed and negative except per interval history/subjective  Physical Exam: Vitals:   01/03/24 1210 01/03/24 1219  BP: (!) 144/75 124/73  Pulse:    Resp: 18 18  Temp:    SpO2:     Total I/O In: -  Out: 400 [Urine:400]  Intake/Output Summary (Last 24 hours) at 01/03/2024 1259 Last data filed at  01/03/2024 1200 Gross per 24 hour  Intake 240 ml  Output 1700 ml  Net -1460 ml   Constitutional: Elderly-appearing, no acute distress ENMT: ears and nose without scars or lesions, MMM CV: normal rate, 1+ edema in the bilateral lower extremities Respiratory: Bilateral chest rise, normal work of breathing Gastrointestinal: soft, non-tender, no palpable masses or hernias Skin: no visible lesions or rashes Psych: alert, judgement/insight appropriate, appropriate mood and affect   Test Results I personally reviewed new and old clinical labs and radiology tests Lab Results  Component Value Date   NA 127 (L) 01/03/2024   K 3.7 01/03/2024   CL 101 01/03/2024   CO2 21 (L) 01/03/2024   BUN 49 (H) 01/03/2024   CREATININE 2.77 (H) 01/03/2024   CALCIUM  9.2 01/03/2024   ALBUMIN 3.0 (L) 01/03/2024   PHOS 3.6 01/03/2024    CBC Recent Labs  Lab 12/31/23 0949 01/01/24 0304 01/02/24 0047  WBC 4.1 4.4 5.5  NEUTROABS 2.7  --   --   HGB 10.4* 9.0* 8.9*  HCT 32.9* 28.4* 28.8*  MCV 81.8 82.6 85.5  PLT 278 249 256

## 2024-01-03 NOTE — Progress Notes (Signed)
 Physical Therapy Treatment Patient Details Name: Danny Rhodes MRN: [id number] DOB: April 24, 1927 Today's Date: 01/03/2024   History of Present Illness Danny Rhodes is a 89 yo male admitted to [facility] for hyponatremia on 12/31/23. PMH: CKD, hypertensive retinopathy, glaucoma, anemia of renal disease, osteoarthritis, right hip prosthesis dislocation, dysphagia, failure to thrive, gout, protein calorie malnutrition, hyperlipidemia, transaminitis    PT Comments  Tolerated therapy well.  HR did increase with activity to 140 bpm max but quickly back to 80's with rest - notified RN who reports HR has been varied at times.  Pt ambulated 12' with RW and CGA.  Continue POC.  No PT needs at d/c.     If plan is discharge home, recommend the following: Assistance with cooking/housework;Help with stairs or ramp for entrance   Can travel by private vehicle        Equipment Recommendations  None recommended by PT    Recommendations for Other Services       Precautions / Restrictions Precautions Precautions: Fall Restrictions Weight Bearing Restrictions Per Provider Order: No     Mobility  Bed Mobility Overal bed mobility: Needs Assistance Bed Mobility: Sit to Supine       Sit to supine: Supervision   General bed mobility comments: in recliner at arrival; hooks legs and uses momentum to get back in bed    Transfers Overall transfer level: Needs assistance Equipment used: Rolling walker (2 wheels) Transfers: Sit to/from Stand Sit to Stand: Contact guard assist           General transfer comment: STS from recliner and low toilet; performed ADLs wtih supervision    Ambulation/Gait Ambulation/Gait assistance: Contact guard assist Gait Distance (Feet): 80 Feet Assistive device: Rolling walker (2 wheels) Gait Pattern/deviations: Step-through pattern, Decreased stride length, Decreased weight shift to right Gait velocity: decreased but functional     General Gait Details: leg length  discrepancy on R (R shorter) leading to decreased weight shift to R and flexing L knee during swing phase but does straighten L knee during stance   Stairs             Wheelchair Mobility     Tilt Bed    Modified Rankin (Stroke Patients Only)       Balance Overall balance assessment: Needs assistance Sitting-balance support: No upper extremity supported Sitting balance-[name]: Good     Standing balance support: Bilateral upper extremity supported Standing balance-[name]: Poor Standing balance comment: RW to ambulate- CGA                            Communication    Cognition Arousal: Alert Behavior During Therapy: [name] for tasks assessed/performed   PT - Cognitive impairments: No apparent impairments                       PT - Cognition Comments: pleasant and motivated        Cueing    Exercises      General Comments General comments (skin integrity, edema, etc.): O2 sats stable on RA.  HR up to 120's-130's with ambulation , briefly hit 140 bpm , quickly down to 80's with rest.  Denies any lightheadedness/dizziness      Pertinent Vitals/Pain Pain Assessment Pain Assessment: No/denies pain    Home Living  Prior Function            PT Goals (current goals can now be found in the care plan section) Progress towards PT goals: Progressing toward goals    Frequency    Min 2X/week      PT Plan      Co-evaluation              AM-PAC PT 6 Clicks Mobility   Outcome Measure  Help needed turning from your back to your side while in a flat bed without using bedrails?: None Help needed moving from lying on your back to sitting on the side of a flat bed without using bedrails?: A Little Help needed moving to and from a bed to a chair (including a wheelchair)?: A Little Help needed standing up from a chair using your arms (e.g., wheelchair or bedside chair)?: A Little Help needed  to walk in hospital room?: A Little Help needed climbing 3-5 steps with a railing? : A Little 6 Click Score: 19    End of Session Equipment Utilized During Treatment: Gait belt Activity Tolerance: Patient tolerated treatment well Patient left: with call bell/phone within reach;in bed;with bed alarm set Nurse Communication: Mobility status PT Visit Diagnosis: Other abnormalities of gait and mobility (R26.89)     Time: 8294-8272 PT Time Calculation (min) (ACUTE ONLY): 22 min  Charges:    $Gait Training: 8-22 mins PT General Charges $$ ACUTE PT VISIT: 1 Visit                     Danny, PT Acute Rehab Services [facility] [phone]    Danny Rhodes 01/03/2024, 5:49 PM

## 2024-01-03 NOTE — Discharge Instructions (Signed)

## 2024-01-03 NOTE — Plan of Care (Signed)
  Problem: Clinical Measurements: Goal: Ability to maintain clinical measurements within normal limits will improve Outcome: Progressing   Problem: Nutrition: Goal: Adequate nutrition will be maintained Outcome: Progressing   Problem: Elimination: Goal: Will not experience complications related to bowel motility Outcome: Progressing   Problem: Pain Managment: Goal: General experience of comfort will improve and/or be controlled Outcome: Progressing   Problem: Safety: Goal: Ability to remain free from injury will improve Outcome: Progressing

## 2024-01-04 ENCOUNTER — Encounter (HOSPITAL_COMMUNITY): Payer: Self-pay

## 2024-01-04 ENCOUNTER — Telehealth (HOSPITAL_COMMUNITY): Payer: Self-pay | Admitting: Pharmacy Technician

## 2024-01-04 ENCOUNTER — Other Ambulatory Visit (HOSPITAL_COMMUNITY): Payer: Self-pay

## 2024-01-04 DIAGNOSIS — M109 Gout, unspecified: Secondary | ICD-10-CM

## 2024-01-04 DIAGNOSIS — I10 Essential (primary) hypertension: Secondary | ICD-10-CM | POA: Diagnosis not present

## 2024-01-04 DIAGNOSIS — N184 Chronic kidney disease, stage 4 (severe): Secondary | ICD-10-CM | POA: Diagnosis not present

## 2024-01-04 DIAGNOSIS — E785 Hyperlipidemia, unspecified: Secondary | ICD-10-CM

## 2024-01-04 DIAGNOSIS — D631 Anemia in chronic kidney disease: Secondary | ICD-10-CM

## 2024-01-04 DIAGNOSIS — I4891 Unspecified atrial fibrillation: Secondary | ICD-10-CM

## 2024-01-04 DIAGNOSIS — E871 Hypo-osmolality and hyponatremia: Secondary | ICD-10-CM | POA: Diagnosis not present

## 2024-01-04 LAB — CBC
HCT: 26.9 % — ABNORMAL LOW (ref 39.0–52.0)
Hemoglobin: 8.6 g/dL — ABNORMAL LOW (ref 13.0–17.0)
MCH: 26.5 pg (ref 26.0–34.0)
MCHC: 32 g/dL (ref 30.0–36.0)
MCV: 82.8 fL (ref 80.0–100.0)
Platelets: 284 K/uL (ref 150–400)
RBC: 3.25 MIL/uL — ABNORMAL LOW (ref 4.22–5.81)
RDW: 17.2 % — ABNORMAL HIGH (ref 11.5–15.5)
WBC: 5.3 K/uL (ref 4.0–10.5)
nRBC: 0 % (ref 0.0–0.2)

## 2024-01-04 LAB — MAGNESIUM: Magnesium: 2.2 mg/dL (ref 1.7–2.4)

## 2024-01-04 LAB — COMPREHENSIVE METABOLIC PANEL WITH GFR
ALT: 9 U/L (ref 0–44)
AST: 15 U/L (ref 15–41)
Albumin: 2.9 g/dL — ABNORMAL LOW (ref 3.5–5.0)
Alkaline Phosphatase: 65 U/L (ref 38–126)
Anion gap: 5 (ref 5–15)
BUN: 53 mg/dL — ABNORMAL HIGH (ref 8–23)
CO2: 21 mmol/L — ABNORMAL LOW (ref 22–32)
Calcium: 9.2 mg/dL (ref 8.9–10.3)
Chloride: 102 mmol/L (ref 98–111)
Creatinine, Ser: 2.89 mg/dL — ABNORMAL HIGH (ref 0.61–1.24)
GFR, Estimated: 20 mL/min — ABNORMAL LOW (ref >60)
Glucose, Bld: 90 mg/dL (ref 70–99)
Potassium: 3.7 mmol/L (ref 3.5–5.1)
Sodium: 128 mmol/L — ABNORMAL LOW (ref 135–145)
Total Bilirubin: 0.7 mg/dL (ref 0.0–1.2)
Total Protein: 5.7 g/dL — ABNORMAL LOW (ref 6.5–8.1)

## 2024-01-04 MED ORDER — FUROSEMIDE 10 MG/ML IJ SOLN
160.0000 mg | Freq: Four times a day (QID) | INTRAVENOUS | Status: AC
  Administered 2024-01-04 (×2): 160 mg via INTRAVENOUS
  Filled 2024-01-04 (×2): qty 10

## 2024-01-04 MED ORDER — AMLODIPINE BESYLATE 10 MG PO TABS
5.0000 mg | ORAL_TABLET | Freq: Every day | ORAL | Status: DC
  Administered 2024-01-05: 5 mg via ORAL
  Filled 2024-01-04: qty 1

## 2024-01-04 MED ORDER — HYDRALAZINE HCL 20 MG/ML IJ SOLN: 10.0000 mg | INTRAMUSCULAR | Status: DC | PRN

## 2024-01-04 NOTE — Progress Notes (Addendum)
 Nephrology Follow-Up Consult note   Assessment/Recommendations: Danny Rhodes is a/an 89 y.o. male with a past medical history CKD 4, HTN, HLD who present w/ hyponatremia   Symptomatic hyponatremia: Generalized symptoms.  Has improved with volume restriction.  Still some signs of volume overload.  Cannot interpret urine lytes in the setting of volume excess given that this is most likely cause of the low sodium.  Continue with volume restriction.  Only responding to diuretics moderately.  Continue with IV Lasix  160 mg x 2 doses today.  Likely oral diuretics tomorrow   CKD4: Follows with Dr. Marla. Creatinine around 2.5-3.  This is the patient's baseline.  No concerns at this time likely will rise slightly as we optimize his volume status -Continue to monitor daily Cr, Dose meds for GFR -Monitor Daily I/Os, Daily weight  -Maintain MAP>65 for optimal renal perfusion.  -Avoid nephrotoxic medications including NSAIDs -Use synthetic opioids (Fentanyl /Dilaudid ) if needed -Currently no indication for HD     Chronic diastolic heart failure: diastolic with moderate elevation in pulmonary pressures on Echo. Diuretics as above   Metabolic acidosis: Bicarb mildly decreased at 17.  Continue to monitor   Anemia: Likely multifactorial.  CKD contributing.  Continue to monitor  Atrial fibrillation: Management per primary team  Hypertension: I will decrease the patient's amlodipine  to 5 mg just in case this is contributing to his edema.   Recommendations conveyed to primary service.    [facility] 01/04/2024 3:00 PM  ___________________________________________________________  CC: Weakness and dizziness  Interval History/Subjective: Patient states he feels great today with no complaints.  Persistent lower extremity edema.  Creatinine slightly higher but overall acceptable with volume removal.  Sodium slightly improved again  Medications:  Current  Facility-Administered Medications  Medication Dose Route Frequency Provider Last Rate Last Admin   acetaminophen  (TYLENOL ) tablet 650 mg  650 mg Oral Q6H PRN Cindy Garnette POUR, MD       Or   acetaminophen  (TYLENOL ) suppository 650 mg  650 mg Rectal Q6H PRN Cindy Garnette POUR, MD       allopurinol  (ZYLOPRIM ) tablet 100 mg  100 mg Oral q morning Cindy Garnette POUR, MD   100 mg at 01/04/24 9060   amLODipine  (NORVASC ) tablet 10 mg  10 mg Oral Daily Cindy Garnette POUR, MD   10 mg at 01/04/24 9060   apixaban  (ELIQUIS ) tablet 2.5 mg  2.5 mg Oral BID Legge, Justin M, RPH   2.5 mg at 01/04/24 0939   atorvastatin  (LIPITOR) tablet 20 mg  20 mg Oral Daily Cindy Garnette POUR, MD   20 mg at 01/04/24 9060   atropine  1 MG/10ML injection 0.5 mg  0.5 mg Intravenous PRN Cindy Garnette POUR, MD       Chlorhexidine  Gluconate Cloth 2 % PADS 6 each  6 each Topical Daily Cindy Garnette POUR, MD   6 each at 01/01/24 1532   feeding supplement (ENSURE PLUS HIGH PROTEIN) liquid 237 mL  237 mL Oral BID BM Cindy Garnette POUR, MD   237 mL at 01/04/24 1413   furosemide  (LASIX ) 160 mg in dextrose  5 % 50 mL IVPB  160 mg Intravenous Q6H Player Danny JINNY, MD 66 mL/hr at 01/04/24 1429 160 mg at 01/04/24 1429   hydrALAZINE  (APRESOLINE ) injection 10 mg  10 mg Intravenous Q4H PRN Cindy Garnette POUR, MD   10 mg at 01/01/24 1328   labetalol  (NORMODYNE ) injection 5 mg  5 mg Intravenous Q2H PRN Cindy Garnette POUR, MD  5 mg at 01/03/24 1210   latanoprost  (XALATAN ) 0.005 % ophthalmic solution 1 drop  1 drop Both Eyes QHS Cindy Garnette POUR, MD   1 drop at 01/03/24 2123   metoprolol  tartrate (LOPRESSOR ) tablet 12.5 mg  12.5 mg Oral BID Cindy Garnette POUR, MD   12.5 mg at 01/04/24 9060   ondansetron  (ZOFRAN ) tablet 4 mg  4 mg Oral Q6H PRN Cindy Garnette POUR, MD       Or   ondansetron  (ZOFRAN ) injection 4 mg  4 mg Intravenous Q6H PRN Cindy Garnette POUR, MD       Oral care mouth rinse  15 mL Mouth Rinse PRN Cindy Garnette POUR, MD          Review of Systems: 10 systems reviewed and  negative except per interval history/subjective  Physical Exam: Vitals:   01/04/24 0828 01/04/24 1350  BP: (!) 175/56 133/87  Pulse:  88  Resp:  18  Temp:  97.8 F (36.6 C)  SpO2:  100%   Total I/O In: 240 [P.O.:240] Out: 650 [Urine:650]  Intake/Output Summary (Last 24 hours) at 01/04/2024 1500 Last data filed at 01/04/2024 1300 Gross per 24 hour  Intake 306 ml  Output 2700 ml  Net -2394 ml   Constitutional: Elderly-appearing, no acute distress ENMT: ears and nose without scars or lesions, MMM CV: normal rate, 1+ edema in the bilateral lower extremities Respiratory: Bilateral chest rise, normal work of breathing Gastrointestinal: soft, non-tender, no palpable masses or hernias Skin: no visible lesions or rashes Psych: alert, judgement/insight appropriate, appropriate mood and affect   Test Results I personally reviewed new and old clinical labs and radiology tests Lab Results  Component Value Date   NA 128 (L) 01/04/2024   K 3.7 01/04/2024   CL 102 01/04/2024   CO2 21 (L) 01/04/2024   BUN 53 (H) 01/04/2024   CREATININE 2.89 (H) 01/04/2024   CALCIUM  9.2 01/04/2024   ALBUMIN 2.9 (L) 01/04/2024   PHOS 3.6 01/03/2024    CBC Recent Labs  Lab 12/31/23 0949 01/01/24 0304 01/02/24 0047 01/04/24 0413  WBC 4.1 4.4 5.5 5.3  NEUTROABS 2.7  --   --   --   HGB 10.4* 9.0* 8.9* 8.6*  HCT 32.9* 28.4* 28.8* 26.9*  MCV 81.8 82.6 85.5 82.8  PLT 278 249 256 284

## 2024-01-04 NOTE — Telephone Encounter (Signed)
 Patient Product/process development scientist completed.    The patient is insured through [facility]. Patient has Medicare and is not eligible for a copay card, but may be able to apply for patient assistance or Medicare RX Payment Plan (Patient Must reach out to their plan, if eligible for payment plan), if available.    Ran test claim for Eliquis  5 mg and the current 30 day co-pay is $387.00 due to a $340.00 deductible. Will be $47.00 once deductible is met.   This test claim was processed through [facility]- copay amounts may vary at other pharmacies due to pharmacy/plan contracts, or as the patient moves through the different stages of their insurance plan.     Reyes Sharps, CPHT Pharmacy Technician III Certified Patient Advocate [facility] Patient Advocate Team Direct Number: [phone]  Fax: [phone]

## 2024-01-04 NOTE — Progress Notes (Signed)
 PROGRESS NOTE    Danny Rhodes  FMW:[id number] DOB: 30-Jan-1927 DOA: 12/31/2023 PCP: Gerome Brunet, DO    Chief Complaint  Patient presents with   Weakness   Dizziness    Brief Narrative:  89 y.o. male with medical history significant of CKD, hypertensive retinopathy, glaucoma, anemia of renal disease, osteoarthritis, right hip prosthesis dislocation, dysphagia, failure to thrive, gout, protein calorie malnutrition, hyperlipidemia, transaminitis who presented to the emergency department with generalized weakness and dizziness.  No significant changes in appetite recently.  His furosemide  was increased from 40 mg twice daily to 80 mg twice a day last month.  He has been on a low-sodium diet at home.  He denied fever, chills, rhinorrhea, sore throat, wheezing or hemoptysis.  No chest pain, palpitations, diaphoresis, PND, orthopnea or pitting edema of the lower extremities.  No abdominal pain, nausea, emesis, diarrhea, constipation, melena or hematochezia.  No flank pain, dysuria, frequency or hematuria.  No polyuria, polydipsia, polyphagia or blurred vision.    Lab work: Urinalysis showed greater than 300 mg/dL of protein, but was otherwise unremarkable.  Urine creatinine was normal at 42 mg/dL.  CBC showed white count 4.1, hemoglobin 10.4 g/dL and platelets 721.  Magnesium is 2.2 mg/dL.  Normal TSH level.  CMP showed sodium 118, potassium 4.8, chloride 90 and CO2 22 mmol/L.  BUN was 46 and creatinine 2.64 mg/dL.  Normal glucose, calcium  and LFTs.   Assessment & Plan:   Principal Problem:   Hyponatremia Active Problems:   Essential hypertension   Gout   CKD (chronic kidney disease), symptom management only, stage 4 (severe) (HCC)   Anemia of chronic renal failure   Hyperlipidemia   New onset a-fib (HCC)  #1 symptomatic hyponatremia - Patient noted to have presented with generalized symptoms of weakness, dizziness. - Initially felt on presentation to be secondary to diuresis with Lasix   which was held and patient placed on a 1200 cc fluid restriction. - Initial workup was concerning for possible SIADH however in the setting of volume overload it was felt not diagnostic. - Patient seen in consultation by nephrology and patient placed on IV Lasix  of 120 mg every 12 hours and dose subsequently increased to 160 mg every 6 hours. - Patient with urine output of 2.450 L over the past 24 hours and is -4.5 L during this hospitalization. - Sodium levels improving with IV diuretics and fluid restriction with sodium currently at 128 this morning from 118 on admission. - Patient also with symptomatic improvement - Nephrology recommending continuation of IV Lasix  160 mg x 2 doses today and can likely transition to oral diuretics tomorrow. - Nephrology following and appreciate input and recommendations.  2.  CKD stage IV -Being followed in outpatient setting by Dr. Rayburn with baseline creatinine approximately 2.5-3. - Patient being followed by nephrology-patient currently at baseline. - Avoid nephrotoxic agents. - Per nephrology.  3.  Hypertension -Currently on IV Lasix . - Amlodipine  being decreased to 5 mg daily per nephrology in case amlodipine  was contributing to patient's edema. - Continue Lopressor . - IV hydralazine  as needed.  4.  Gout -Continue allopurinol .  5.  Anemia of chronic renal disease -Hemoglobin currently stable at 8.6. - Follow H&H. - Transfusion threshold hemoglobin < 8.  6.  Hyperlipidemia -Atorvastatin  20 mg daily.  7.  New diagnosis of A-fib with RVR -Patient noted to have an A-fib with RVR during his hospitalization. - TSH within normal limits. - 2D echo with a EF of 60 to 65%, and WMA,  grade 2 diastolic dysfunction, moderately elevated pulmonary artery systolic pressure, mildly dilated left atrial size, moderate TVR. - Heart rate noted to have improved with addition of low-dose beta-blocker - Continue Eliquis  for anticoagulation. - Dr. Cindy noted  to have contacted cardiology to arrange outpatient visit at the A-fib clinic and appointment placed in AVS.   DVT prophylaxis: Eliquis  Code Status: DNR Family Communication: Updated patient and son at bedside. Disposition: Likely home when clinically improved and cleared by nephrology.  Status is: Inpatient Remains inpatient appropriate because: Severity of illness   Consultants:  Nephrology: Dr. Macel 01/02/2024  Procedures:  2D echo 01/01/2024   Antimicrobials:  Anti-infectives (From admission, onward)    None         Subjective: Sitting up in recliner.  States he feels really well today.  Stated ambulated in hallway.  Denies any chest pain or shortness of breath.  No abdominal pain.  States significant improvement with lower extremity edema.  Son at bedside.  Objective: Vitals:   01/03/24 2038 01/04/24 0504 01/04/24 0828 01/04/24 1350  BP: (!) 154/57 (!) 150/49 (!) 175/56 133/87  Pulse: 82 65  88  Resp: 16 14  18   Temp: 98.6 F (37 C) 98.3 F (36.8 C)  97.8 F (36.6 C)  TempSrc: Oral   Oral  SpO2: 100% 99%  100%  Weight:      Height:        Intake/Output Summary (Last 24 hours) at 01/04/2024 1859 Last data filed at 01/04/2024 1854 Gross per 24 hour  Intake 240 ml  Output 3250 ml  Net -3010 ml   Filed Weights   12/31/23 0847 12/31/23 1500  Weight: 74.8 kg 69.7 kg    Examination:  General exam: Appears calm and comfortable  Respiratory system: Clear to auscultation.  No wheezes, no crackles, no rhonchi.  Fair air movement.  Speaking in full sentences.   Cardiovascular system: Irregularly irregular. No JVD, murmurs, rubs, gallops or clicks.  Trace to 1+ bilateral lower extremity edema.  Gastrointestinal system: Abdomen is nondistended, soft and nontender. No organomegaly or masses felt. Normal bowel sounds heard. Central nervous system: Alert and oriented. No focal neurological deficits. Extremities: Symmetric 5 x 5 power. Skin: No rashes, lesions or  ulcers Psychiatry: Judgement and insight appear normal. Mood & affect appropriate.     Data Reviewed: I have personally reviewed following labs and imaging studies  CBC: Recent Labs  Lab 12/31/23 0949 01/01/24 0304 01/02/24 0047 01/04/24 0413  WBC 4.1 4.4 5.5 5.3  NEUTROABS 2.7  --   --   --   HGB 10.4* 9.0* 8.9* 8.6*  HCT 32.9* 28.4* 28.8* 26.9*  MCV 81.8 82.6 85.5 82.8  PLT 278 249 256 284    Basic Metabolic Panel: Recent Labs  Lab 12/31/23 0949 12/31/23 1527 01/02/24 0047 01/02/24 0919 01/02/24 1701 01/03/24 0350 01/04/24 0413  NA 118*   < > 124* 126* 124* 127* 128*  K 4.8   < > 4.0 4.3 4.2 3.7 3.7  CL 90*   < > 100 100 98 101 102  CO2 22   < > 18* 17* 17* 21* 21*  GLUCOSE 99   < > 96 114* 124* 125* 90  BUN 46*   < > 49* 48* 50* 49* 53*  CREATININE 2.64*   < > 2.79* 2.76* 2.82* 2.77* 2.89*  CALCIUM  9.6   < > 9.1 9.7 9.7 9.2 9.2  MG 2.2  --   --   --   --   --  2.2  PHOS  --   --   --   --   --  3.6  --    < > = values in this interval not displayed.    GFR: Estimated Creatinine Clearance: 14.1 mL/min (A) (by C-G formula based on SCr of 2.89 mg/dL (H)).  Liver Function Tests: Recent Labs  Lab 12/31/23 0949 01/01/24 0304 01/02/24 0047 01/03/24 0350 01/04/24 0413  AST 21 14* 15  --  15  ALT 11 11 9   --  9  ALKPHOS 85 67 67  --  65  BILITOT 0.7 0.8 0.8  --  0.7  PROT 7.5 5.6* 5.8*  --  5.7*  ALBUMIN 3.7 2.9* 2.9* 3.0* 2.9*    CBG: No results for input(s): GLUCAP in the last 168 hours.   Recent Results (from the past 240 hours)  MRSA Next Gen by PCR, Nasal     Status: None   Collection Time: 12/31/23  3:03 PM   Specimen: Nasal Mucosa; Nasal Swab  Result Value Ref Range Status   MRSA by PCR Next Gen NOT DETECTED NOT DETECTED Final    Comment: (NOTE) The GeneXpert MRSA Assay (FDA approved for NASAL specimens only), is one component of a comprehensive MRSA colonization surveillance program. It is not intended to diagnose MRSA infection nor to  guide or monitor treatment for MRSA infections. Test performance is not FDA approved in patients less than 68 years old. Performed at [facility], [address] [place], [place] [address]          Radiology Studies: No results found.      Scheduled Meds:  allopurinol   100 mg Oral q morning   [START ON 01/05/2024] amLODipine   5 mg Oral Daily   apixaban   2.5 mg Oral BID   atorvastatin   20 mg Oral Daily   Chlorhexidine  Gluconate Cloth  6 each Topical Daily   feeding supplement  237 mL Oral BID BM   latanoprost   1 drop Both Eyes QHS   metoprolol  tartrate  12.5 mg Oral BID   Continuous Infusions:     LOS: 2 days    Time spent: 40 minutes    Toribio Hummer, MD [facility]   To contact the attending provider between 7A-7P or the covering provider during after hours 7P-7A, please log into the web site [email or web address] and access using universal  password for that web site. If you do not have the password, please call the hospital operator.  01/04/2024, 6:59 PM

## 2024-01-04 NOTE — Progress Notes (Signed)
 Occupational Therapy Treatment Patient Details Name: Danny Rhodes MRN: [id number] DOB: 12-05-1926 Today's Date: 01/04/2024   History of present illness Danny Rhodes is a 45 yr ld male admitted to [facility] for hyponatremia on 12/31/23. PMH: CKD, hypertensive retinopathy, glaucoma, anemia of renal disease, osteoarthritis, right hip prosthesis dislocation, dysphagia, failure to thrive, gout, protein calorie malnutrition, hyperlipidemia, transaminitis   OT comments  The pt was pleasant and motivated to participate in the session. The pt reported having intermittent difficulty with donning and doffing his socks at his baseline. As such, he was instructed on the use of a reacher to doff socks and sock aid to donn them, while he was seated in the bedside chair. He subsequently required CGA to stand using a RW, and SBA to CGA for grooming at sink level. He reported having chronic R hip discomfort, stating he had a prior hip surgery which resulted in removal of his hip bone. Continue OT plan of care. Anticipate no post-acute care therapy needs.       If plan is discharge home, recommend the following:  A little help with walking and/or transfers;A little help with bathing/dressing/bathroom;Assistance with cooking/housework   Equipment Recommendations  None recommended by OT    Recommendations for Other Services      Precautions / Restrictions Restrictions Weight Bearing Restrictions Per Provider Order: No       Mobility Bed Mobility      General bed mobility comments: pt was received seated in the chair    Transfers Overall transfer level: Needs assistance Equipment used: Rolling walker (2 wheels) Transfers: Sit to/from Stand Sit to Stand: Contact guard assist                 Balance     Sitting balance-[name]: Good         Standing balance comment: CGA with RW          ADL either performed or assessed with clinical judgement   ADL Overall ADL's : Needs  assistance/impaired     Grooming: Supervision/safety;Contact guard assist;Standing;Sitting Grooming Details (indicate cue type and reason): The pt performed grooming in sitting and standing at sink level. Sitting implemented periodically for therapeutic rest. He performed face washing, hand washing, and partial BUE washing.             Lower Body Dressing: Minimal assistance;Sitting/lateral leans;Cueing for compensatory techniques;With adaptive equipment Lower Body Dressing Details (indicate cue type and reason): The pt reported having intermittent difficulty with donning and doffing his socks at his baseline. As such, she was instructed on the use of a reacher to doff socks and sock aid to donn them, while he was seated in the bedside chair. He required min assist and min cues for correct equipment use, in order to provide teach back.                     Communication Communication Factors Affecting Communication: Hearing impaired   Cognition Arousal: Alert Behavior During Therapy: [name] for tasks assessed/performed Cognition: No apparent impairments          Following commands: Intact        Cueing   Cueing Techniques: Verbal cues  Exercises              Pertinent Vitals/ Pain       Pain Assessment Pain Assessment: No/denies pain   Frequency  Min 2X/week        Progress Toward Goals  OT Goals(current goals can  now be found in the care plan section)  Progress towards OT goals: Progressing toward goals  Acute Rehab OT Goals OT Goal Formulation: With patient Time For Goal Achievement: 01/16/24 Potential to Achieve Goals: Good  Plan         AM-PAC OT 6 Clicks Daily Activity     Outcome Measure   Help from another person eating meals?: None Help from another person taking care of personal grooming?: None Help from another person toileting, which includes using toliet, bedpan, or urinal?: A Little Help from another person bathing (including  washing, rinsing, drying)?: A Little Help from another person to put on and taking off regular upper body clothing?: None Help from another person to put on and taking off regular lower body clothing?: A Little 6 Click Score: 21    End of Session Equipment Utilized During Treatment: Gait belt;Rolling walker (2 wheels)  OT Visit Diagnosis: Unsteadiness on feet (R26.81)   Activity Tolerance Patient tolerated treatment well   Patient Left in chair;with call bell/phone within reach;with chair alarm set   Nurse Communication Mobility status        Time: 8949-8883 OT Time Calculation (min): 26 min  Charges: OT General Charges $OT Visit: 1 Visit OT Treatments $Self Care/Home Management : 8-22 mins $Therapeutic Activity: 8-22 mins    Danny Rhodes, OTR/L 01/04/2024, 1:36 PM

## 2024-01-05 ENCOUNTER — Other Ambulatory Visit (HOSPITAL_BASED_OUTPATIENT_CLINIC_OR_DEPARTMENT_OTHER): Payer: Self-pay

## 2024-01-05 ENCOUNTER — Other Ambulatory Visit (HOSPITAL_COMMUNITY): Payer: Self-pay

## 2024-01-05 ENCOUNTER — Other Ambulatory Visit: Payer: Self-pay

## 2024-01-05 ENCOUNTER — Encounter (HOSPITAL_COMMUNITY): Payer: Self-pay | Admitting: Internal Medicine

## 2024-01-05 ENCOUNTER — Encounter (HOSPITAL_COMMUNITY): Payer: Self-pay

## 2024-01-05 DIAGNOSIS — E871 Hypo-osmolality and hyponatremia: Secondary | ICD-10-CM | POA: Diagnosis not present

## 2024-01-05 DIAGNOSIS — N184 Chronic kidney disease, stage 4 (severe): Secondary | ICD-10-CM | POA: Diagnosis not present

## 2024-01-05 DIAGNOSIS — I4891 Unspecified atrial fibrillation: Secondary | ICD-10-CM | POA: Diagnosis not present

## 2024-01-05 DIAGNOSIS — I10 Essential (primary) hypertension: Secondary | ICD-10-CM | POA: Diagnosis not present

## 2024-01-05 LAB — RENAL FUNCTION PANEL
Albumin: 2.9 g/dL — ABNORMAL LOW (ref 3.5–5.0)
Anion gap: 6 (ref 5–15)
BUN: 55 mg/dL — ABNORMAL HIGH (ref 8–23)
CO2: 21 mmol/L — ABNORMAL LOW (ref 22–32)
Calcium: 9.3 mg/dL (ref 8.9–10.3)
Chloride: 104 mmol/L (ref 98–111)
Creatinine, Ser: 2.82 mg/dL — ABNORMAL HIGH (ref 0.61–1.24)
GFR, Estimated: 20 mL/min — ABNORMAL LOW (ref >60)
Glucose, Bld: 74 mg/dL (ref 70–99)
Phosphorus: 4 mg/dL (ref 2.5–4.6)
Potassium: 3.7 mmol/L (ref 3.5–5.1)
Sodium: 131 mmol/L — ABNORMAL LOW (ref 135–145)

## 2024-01-05 LAB — CBC
HCT: 27.1 % — ABNORMAL LOW (ref 39.0–52.0)
Hemoglobin: 8.5 g/dL — ABNORMAL LOW (ref 13.0–17.0)
MCH: 26.7 pg (ref 26.0–34.0)
MCHC: 31.4 g/dL (ref 30.0–36.0)
MCV: 85.2 fL (ref 80.0–100.0)
Platelets: 301 K/uL (ref 150–400)
RBC: 3.18 MIL/uL — ABNORMAL LOW (ref 4.22–5.81)
RDW: 17.2 % — ABNORMAL HIGH (ref 11.5–15.5)
WBC: 5.4 K/uL (ref 4.0–10.5)
nRBC: 0 % (ref 0.0–0.2)

## 2024-01-05 MED ORDER — TORSEMIDE 100 MG PO TABS
100.0000 mg | ORAL_TABLET | Freq: Every day | ORAL | 1 refills | Status: AC
  Filled 2024-01-05: qty 30, 30d supply, fill #0
  Filled 2024-01-31 (×2): qty 30, 30d supply, fill #1

## 2024-01-05 MED ORDER — PANTOPRAZOLE SODIUM 40 MG PO TBEC
40.0000 mg | DELAYED_RELEASE_TABLET | Freq: Every day | ORAL | 0 refills | Status: AC
  Filled 2024-01-05: qty 30, 30d supply, fill #0

## 2024-01-05 MED ORDER — TORSEMIDE 20 MG PO TABS
100.0000 mg | ORAL_TABLET | Freq: Every day | ORAL | Status: DC
  Administered 2024-01-05: 100 mg via ORAL
  Filled 2024-01-05: qty 5

## 2024-01-05 MED ORDER — APIXABAN 2.5 MG PO TABS
2.5000 mg | ORAL_TABLET | Freq: Two times a day (BID) | ORAL | 2 refills | Status: DC
Start: 2024-01-05 — End: 2024-01-27
  Filled 2024-01-05: qty 60, 30d supply, fill #0

## 2024-01-05 MED ORDER — AMLODIPINE BESYLATE 5 MG PO TABS
5.0000 mg | ORAL_TABLET | Freq: Every morning | ORAL | 0 refills | Status: DC
  Filled 2024-01-05: qty 90, 90d supply, fill #0

## 2024-01-05 MED ORDER — ENSURE PLUS HIGH PROTEIN PO LIQD: 237.0000 mL | Freq: Two times a day (BID) | ORAL | Status: AC

## 2024-01-05 NOTE — Progress Notes (Signed)
 Nephrology Follow-Up Consult note   Assessment/Recommendations: Danny Rhodes is a/an 89 y.o. male with a past medical history CKD 4, HTN, HLD who present w/ hyponatremia   Symptomatic hyponatremia: Generalized symptoms.  Has improved with volume restriction and no longer symptomatic.  I do not think SIADH is playing a major role.  Urine lites were impacted by diuretics and he also has signs of volume overload which is likely contributed.  Has improved to a safe level at this time.  Recommend discharging on torsemide  100 mg daily, 1.5 L fluid restriction at home.  We will arrange labs next week as well as follow-up in a few weeks in our office   CKD4: Follows with Dr. Marla. Creatinine around 2.5-3.  This is the patient's baseline.  No concerns at this time likely will rise slightly as we optimize his volume status -Continue to monitor daily Cr, Dose meds for GFR -Monitor Daily I/Os, Daily weight  -Maintain MAP>65 for optimal renal perfusion.  -Avoid nephrotoxic medications including NSAIDs -Use synthetic opioids (Fentanyl /Dilaudid ) if needed -Currently no indication for HD     Chronic diastolic heart failure: diastolic with moderate elevation in pulmonary pressures on Echo. Diuretics as above   Metabolic acidosis: Bicarb mildly decreased.  Continue to monitor   Anemia: Likely multifactorial.  CKD contributing.  Continue to monitor.  Receives ESA outpatient  Atrial fibrillation: Management per primary team  Hypertension: I will decrease the patient's amlodipine  to 5 mg just in case this is contributing to his edema.   Recommendations conveyed to primary service.    [name] Player [facility] 01/05/2024 11:34 AM  ___________________________________________________________  CC: Weakness and dizziness  Interval History/Subjective: Patient continues to state he feels great.  Creatinine stable.  Sodium slightly improved  Medications:  Current  Facility-Administered Medications  Medication Dose Route Frequency Provider Last Rate Last Admin   acetaminophen  (TYLENOL ) tablet 650 mg  650 mg Oral Q6H PRN Cindy Garnette POUR, MD       Or   acetaminophen  (TYLENOL ) suppository 650 mg  650 mg Rectal Q6H PRN Cindy Garnette POUR, MD       allopurinol  (ZYLOPRIM ) tablet 100 mg  100 mg Oral q morning Cindy Garnette POUR, MD   100 mg at 01/05/24 0840   amLODipine  (NORVASC ) tablet 5 mg  5 mg Oral Daily Natori Gudino J, MD   5 mg at 01/05/24 0840   apixaban  (ELIQUIS ) tablet 2.5 mg  2.5 mg Oral BID Legge, Justin M, RPH   2.5 mg at 01/05/24 0840   atorvastatin  (LIPITOR) tablet 20 mg  20 mg Oral Daily Cindy Garnette POUR, MD   20 mg at 01/05/24 9160   atropine  1 MG/10ML injection 0.5 mg  0.5 mg Intravenous PRN Cindy Garnette POUR, MD       Chlorhexidine  Gluconate Cloth 2 % PADS 6 each  6 each Topical Daily Cindy Garnette POUR, MD   6 each at 01/01/24 1532   feeding supplement (ENSURE PLUS HIGH PROTEIN) liquid 237 mL  237 mL Oral BID BM Cindy Garnette POUR, MD   237 mL at 01/05/24 0841   hydrALAZINE  (APRESOLINE ) injection 10 mg  10 mg Intravenous Q4H PRN Sebastian Toribio GAILS, MD       labetalol  (NORMODYNE ) injection 5 mg  5 mg Intravenous Q2H PRN Cindy Garnette POUR, MD   5 mg at 01/03/24 1210   latanoprost  (XALATAN ) 0.005 % ophthalmic solution 1 drop  1 drop Both Eyes QHS Cindy Garnette POUR, MD  1 drop at 01/04/24 2141   metoprolol  tartrate (LOPRESSOR ) tablet 12.5 mg  12.5 mg Oral BID Cindy Garnette POUR, MD   12.5 mg at 01/05/24 9160   ondansetron  (ZOFRAN ) tablet 4 mg  4 mg Oral Q6H PRN Cindy Garnette POUR, MD       Or   ondansetron  (ZOFRAN ) injection 4 mg  4 mg Intravenous Q6H PRN Cindy Garnette POUR, MD       Oral care mouth rinse  15 mL Mouth Rinse PRN Cindy Garnette POUR, MD       torsemide  (DEMADEX ) tablet 100 mg  100 mg Oral Daily Macel Jayson PARAS, MD   100 mg at 01/05/24 0845      Review of Systems: 10 systems reviewed and negative except per interval history/subjective  Physical  Exam: Vitals:   01/04/24 1350 01/04/24 2100  BP: 133/87 (!) 170/65  Pulse: 88 78  Resp: 18 16  Temp: 97.8 F (36.6 C) 98.2 F (36.8 C)  SpO2: 100% 100%   Total I/O In: 120 [P.O.:120] Out: 350 [Urine:350]  Intake/Output Summary (Last 24 hours) at 01/05/2024 1134 Last data filed at 01/05/2024 9164 Gross per 24 hour  Intake 240 ml  Output 2200 ml  Net -1960 ml   Constitutional: Elderly-appearing, no acute distress ENMT: ears and nose without scars or lesions, MMM CV: normal rate, 1+ edema in the bilateral lower extremities Respiratory: Bilateral chest rise, normal work of breathing Gastrointestinal: soft, non-tender, no palpable masses or hernias Skin: no visible lesions or rashes Psych: alert, judgement/insight appropriate, appropriate mood and affect   Test Results I personally reviewed new and old clinical labs and radiology tests Lab Results  Component Value Date   NA 131 (L) 01/05/2024   K 3.7 01/05/2024   CL 104 01/05/2024   CO2 21 (L) 01/05/2024   BUN 55 (H) 01/05/2024   CREATININE 2.82 (H) 01/05/2024   CALCIUM  9.3 01/05/2024   ALBUMIN 2.9 (L) 01/05/2024   PHOS 4.0 01/05/2024    CBC Recent Labs  Lab 12/31/23 0949 01/01/24 0304 01/02/24 0047 01/04/24 0413 01/05/24 0402  WBC 4.1   < > 5.5 5.3 5.4  NEUTROABS 2.7  --   --   --   --   HGB 10.4*   < > 8.9* 8.6* 8.5*  HCT 32.9*   < > 28.8* 26.9* 27.1*  MCV 81.8   < > 85.5 82.8 85.2  PLT 278   < > 256 284 301   < > = values in this interval not displayed.

## 2024-01-05 NOTE — Progress Notes (Signed)
 Mobility Specialist - Progress Note   01/05/24 1203  Mobility  Activity Ambulated with assistance  Level of Assistance Contact guard assist, steadying assist  Assistive Device Front wheel walker  Distance Ambulated (ft) 50 ft  Range of Motion/Exercises Active  Activity Response Tolerated well  Mobility Referral Yes  Mobility visit 1 Mobility  Mobility Specialist Start Time (ACUTE ONLY) 1145  Mobility Specialist Stop Time (ACUTE ONLY) 1203  Mobility Specialist Time Calculation (min) (ACUTE ONLY) 18 min   Pt was found on recliner chair and agreeable to ambulate. No complaints. At EOS returned to recliner chair with all needs met. Call bell in reach.  Erminio Leos,  Mobility Specialist Can be reached via Secure Chat

## 2024-01-05 NOTE — Progress Notes (Signed)
 Discharge mediations delivered to patient at bedside [facility]

## 2024-01-05 NOTE — Plan of Care (Signed)
   Problem: Education: Goal: Knowledge of General Education information will improve Description Including pain rating scale, medication(s)/side effects and non-pharmacologic comfort measures Outcome: Progressing

## 2024-01-05 NOTE — Discharge Summary (Signed)
 Physician Discharge Summary  Danny Rhodes FMW:[id number] DOB: 05-08-1927 DOA: 12/31/2023  PCP: Gerome Brunet, DO  Admit date: 12/31/2023 Discharge date: 01/05/2024  Time spent: 60 minutes  Recommendations for Outpatient Follow-up:  Follow-up with Dr. Rayburn in 1 week for lab work and hospital follow-up. Follow-up with Collins, Dana, DO in 2 weeks.  On follow-up patient will need a basic metabolic profile done to follow-up on electrolytes and renal function.  Patient needed CBC done to follow-up on counts. Follow-up with A-fib clinic at [facility], cardiology on January 27, 2024 for hospital follow-up for new onset A-fib.   Discharge Diagnoses:  Principal Problem:   Hyponatremia Active Problems:   Essential hypertension   Gout   CKD (chronic kidney disease), symptom management only, stage 4 (severe) (HCC)   Anemia of chronic renal failure   Hyperlipidemia   New onset a-fib [facility])   Discharge Condition: Stable and improved  Diet recommendation: Regular diet with 1200 cc fluid restriction  Filed Weights   12/31/23 0847 12/31/23 1500  Weight: 74.8 kg 69.7 kg    History of present illness:  HPI per Dr. Celinda Marsha Danny Rhodes is a 89 y.o. male with medical history significant of CKD, hypertensive retinopathy, glaucoma, anemia of renal disease, osteoarthritis, right hip prosthesis dislocation, dysphagia, failure to thrive, gout, protein calorie malnutrition, hyperlipidemia, transaminitis who presented to the emergency department with generalized weakness and dizziness.  No significant changes in appetite recently.  His furosemide  was increased from 40 mg twice daily to 80 mg twice a day last month.  He has been on a low-sodium diet at home.  He denied fever, chills, rhinorrhea, sore throat, wheezing or hemoptysis.  No chest pain, palpitations, diaphoresis, PND, orthopnea or pitting edema of the lower extremities.  No abdominal pain, nausea, emesis, diarrhea, constipation, melena or  hematochezia.  No flank pain, dysuria, frequency or hematuria.  No polyuria, polydipsia, polyphagia or blurred vision.    Lab work: Urinalysis showed greater than 300 mg/dL of protein, but was otherwise unremarkable.  Urine creatinine was normal at 42 mg/dL.  CBC showed white count 4.1, hemoglobin 10.4 g/dL and platelets 721.  Magnesium is 2.2 mg/dL.  Normal TSH level.  CMP showed sodium 118, potassium 4.8, chloride 90 and CO2 22 mmol/L.  BUN was 46 and creatinine 2.64 mg/dL.  Normal glucose, calcium  and LFTs.   ED course: Initial vital signs were temperature 97.8 F, pulse 69, respirations 16, BP 185/53 mmHg O2 sat 100% on room air.  Hospital Course:  #1 symptomatic hyponatremia - Patient noted to have presented with generalized symptoms of weakness, dizziness. - Initially felt on presentation to be secondary to diuresis with Lasix  which was held and patient placed on a 1200 cc fluid restriction. - Initial workup was concerning for possible SIADH however in the setting of volume overload it was felt not diagnostic. - Patient seen in consultation by nephrology and patient placed on IV Lasix  of 120 mg every 12 hours and dose subsequently increased to 160 mg every 6 hours. - Patient with good urine output during the hospitalization with clinical improvement and improvement with hyponatremia.  - Sodium levels improved with IV diuretics and fluid restriction with sodium noted at 131 by day of discharge from 118 on admission.  -Patient also noted with symptomatic improvement.   - Patient subsequently transition to torsemide  100 mg daily per nephrology recommendations with close outpatient follow-up.   - Patient will be discharged in stable and improved condition with outpatient follow-up.  2.  CKD stage IV -Being followed in outpatient setting by Dr. Rayburn with baseline creatinine approximately 2.5-3. - Patient being followed by nephrology during the hospitalization and renal function felt to be  at baseline. -Outpatient follow-up with nephrology.   3.  Hypertension - Patient was on IV Lasix  during the hospitalization and subsequently transition to torsemide  by nephrology on day of discharge.  - Amlodipine  dose was decreased to 5 mg daily per nephrology in case amlodipine  was contributing to patient's edema. - Patient also maintained on Lopressor  and be discharged back on home regimen of Coreg .   - Outpatient follow-up with PCP.    4.  Gout - Patient maintained on home regimen allopurinol .   5.  Anemia of chronic renal disease -Hemoglobin stabilized at 8.5 by day of discharge.  -Outpatient follow-up with PCP.   6.  Hyperlipidemia - Patient maintained on home regimen atorvastatin  20 mg daily.   7.  New diagnosis of A-fib with RVR -Patient noted to have an A-fib with RVR during his hospitalization. - TSH within normal limits. - 2D echo with a EF of 60 to 65%, and WMA, grade 2 diastolic dysfunction, moderately elevated pulmonary artery systolic pressure, mildly dilated left atrial size, moderate TVR. - Heart rate noted to have improved with addition of low-dose beta-blocker - Patient placed on Eliquis  for anticoagulation.   - Patient's home regimen aspirin  will be discontinued on discharge.  - Dr. Cindy noted to have contacted cardiology to arrange outpatient visit at the A-fib clinic and appointment placed in AVS.    Procedures: 2D echo 01/01/2024   Consultations: Nephrology: Dr. Macel 01/02/2024    Discharge Exam: Vitals:   01/04/24 1350 01/04/24 2100  BP: 133/87 (!) 170/65  Pulse: 88 78  Resp: 18 16  Temp: 97.8 F (36.6 C) 98.2 F (36.8 C)  SpO2: 100% 100%    General: NAD Cardiovascular: Regular rate rhythm no murmurs rubs or gallops.  No JVD.  1+ bilateral lower extremity edema. Respiratory: Clear to auscultation bilaterally.  No wheezes, no crackles, no rhonchi.  Fair air movement.  Speaking in full sentences.  Discharge Instructions   Discharge  Instructions     Diet general   Complete by: As directed    1500 cc/day fluid restriction   Increase activity slowly   Complete by: As directed       Allergies as of 01/05/2024   No Known Allergies      Medication List     STOP taking these medications    aspirin  81 MG chewable tablet   furosemide  40 MG tablet Commonly known as: LASIX    methocarbamol  500 MG tablet Commonly known as: ROBAXIN    oxyCODONE  5 MG immediate release tablet Commonly known as: Oxy IR/ROXICODONE        TAKE these medications    acetaminophen  500 MG tablet Commonly known as: TYLENOL  Take 500-1,000 mg by mouth 2 (two) times daily as needed for mild pain (pain score 1-3) (or headaches).   allopurinol  100 MG tablet Commonly known as: ZYLOPRIM  Take 100 mg by mouth every morning.   amLODipine  5 MG tablet Commonly known as: NORVASC  Take 1 tablet (5 mg total) by mouth in the morning. What changed:  medication strength how much to take   apixaban  2.5 MG Tabs tablet Commonly known as: ELIQUIS  Take 1 tablet (2.5 mg total) by mouth 2 (two) times daily.   ascorbic acid 500 MG tablet Commonly known as: VITAMIN C Take 500 mg by mouth daily.  atorvastatin  20 MG tablet Commonly known as: LIPITOR Take 20 mg by mouth every morning.   Beet Root 500 MG Caps Take 1,000 mg by mouth daily after breakfast.   carvedilol  6.25 MG tablet Commonly known as: COREG  Take 6.25 mg by mouth 2 (two) times daily with a meal.   dorzolamide  2 % ophthalmic solution Commonly known as: TRUSOPT  Place 1 drop into both eyes 2 (two) times daily.   feeding supplement Liqd Take 237 mLs by mouth 2 (two) times daily between meals.   High Potency Iron 65 MG Tabs Take 130 mg by mouth daily after breakfast.   latanoprost  0.005 % ophthalmic solution Commonly known as: XALATAN  Place 1 drop into both eyes at bedtime.   pantoprazole  40 MG tablet Commonly known as: PROTONIX  Take 1 tablet (40 mg total) by mouth daily  at 6 (six) AM.   torsemide  100 MG tablet Commonly known as: DEMADEX  Take 1 tablet (100 mg total) by mouth daily. Start taking on: January 06, 2024       No Known Allergies  Follow-up Information     Atrial Fib Clinic at [facility] Dept of [facility] Follow up.   Specialty: Cardiology Why: Aug 22 @ 10:30, Hospital follow up Contact information: [address] [place]  [id number] [phone]        Gerome Brunet, DO. Schedule an appointment as soon as possible for a visit in 2 week(s).   Specialty: Family Medicine Contact information: [address] [place] [address] [phone]         Rayburn Pac, MD. Schedule an appointment as soon as possible for a visit in 1 week(s).   Specialty: Nephrology Why: Follow-up next week for lab work and then in 2 to 3 weeks for follow-up with primary nephrologist. Office will call with appointment time. Contact information: [address] [place] [address] [phone]                  The results of significant diagnostics from this hospitalization (including imaging, microbiology, ancillary and laboratory) are listed below for reference.    Significant Diagnostic Studies: ECHOCARDIOGRAM COMPLETE Result Date: 01/01/2024    ECHOCARDIOGRAM REPORT   Patient Name:   Danny Rhodes Date of Exam: 01/01/2024 Medical Rec #:  [id number]       Height:       66.0 in Accession #:    [id number]      Weight:       153.7 lb Date of Birth:  05-Jun-1927        BSA:          1.788 m Patient Age:    89 years        BP:           174/54 mmHg Patient Gender: M               HR:           84 bpm. Exam Location:  Inpatient Procedure: 2D Echo, Color Doppler and Cardiac Doppler (Both Spectral and Color            Flow Doppler were utilized during procedure). Indications:    A fib  History:        Patient has no prior history of Echocardiogram examinations.  Sonographer:    Benard Stallion Referring Phys: [id number] DAVID MANUEL ORTIZ IMPRESSIONS  1. Left ventricular ejection fraction, by estimation, is 60 to 65%.  The left ventricle has normal function. The left ventricle has no regional wall motion abnormalities. Left ventricular diastolic parameters are consistent with Grade II diastolic dysfunction (pseudonormalization).  2. Right ventricular systolic function is normal. The right ventricular size is normal. Mildly increased right ventricular wall thickness. There is moderately elevated pulmonary artery systolic pressure.  3. Left atrial size was mildly dilated.  4. The mitral valve is grossly normal. No evidence of mitral valve regurgitation. No evidence of mitral stenosis.  5. Tricuspid valve regurgitation is moderate.  6. The aortic valve is tricuspid. Aortic valve regurgitation is not visualized. Aortic valve sclerosis is present, with no evidence of aortic valve stenosis.  7. The inferior vena cava is normal in size with greater than 50% respiratory variability, suggesting right atrial pressure of 3 mmHg. Comparison(s): No prior Echocardiogram. FINDINGS  Left Ventricle: Left ventricular ejection fraction, by estimation, is 60 to 65%. The left ventricle has normal function. The left ventricle has no regional wall motion abnormalities. The left ventricular internal cavity size was normal in size. There is  no left ventricular hypertrophy. Left ventricular diastolic parameters are consistent with Grade II diastolic dysfunction (pseudonormalization). Normal left ventricular filling pressure. Right Ventricle: The right ventricular size is normal. Mildly increased right ventricular wall thickness. Right ventricular systolic function is normal. There is moderately elevated pulmonary artery systolic pressure. The tricuspid regurgitant velocity is 3.57 m/s, and with an assumed right atrial pressure of 3 mmHg, the estimated right ventricular systolic pressure is 54.0 mmHg. Left Atrium: Left  atrial size was mildly dilated. Right Atrium: Right atrial size was normal in size. Pericardium: There is no evidence of pericardial effusion. Mitral Valve: The mitral valve is grossly normal. No evidence of mitral valve regurgitation. No evidence of mitral valve stenosis. Tricuspid Valve: The tricuspid valve is grossly normal. Tricuspid valve regurgitation is moderate . No evidence of tricuspid stenosis. Aortic Valve: The aortic valve is tricuspid. Aortic valve regurgitation is not visualized. Aortic valve sclerosis is present, with no evidence of aortic valve stenosis. Aortic valve mean gradient measures 4.5 mmHg. Aortic valve peak gradient measures 7.8  mmHg. Aortic valve area, by VTI measures 2.65 cm. Pulmonic Valve: The pulmonic valve was not well visualized. Pulmonic valve regurgitation is not visualized. No evidence of pulmonic stenosis. Aorta: The aortic root is normal in size and structure. Venous: The inferior vena cava is normal in size with greater than 50% respiratory variability, suggesting right atrial pressure of 3 mmHg. IAS/Shunts: The atrial septum is grossly normal.  LEFT VENTRICLE PLAX 2D LVIDd:         3.90 cm   Diastology LVIDs:         2.60 cm   LV e' medial:    6.31 cm/s LV PW:         0.90 cm   LV E/e' medial:  11.4 LV IVS:        1.00 cm   LV e' lateral:   7.00 cm/s LVOT diam:     2.00 cm   LV E/e' lateral: 10.2 LV SV:         68 LV SV Index:   38 LVOT Area:     3.14 cm  RIGHT VENTRICLE RV Basal diam:  2.70 cm RV Mid diam:    2.40 cm RV S prime:     20.90 cm/s TAPSE (M-mode): 2.1 cm LEFT ATRIUM             Index  RIGHT ATRIUM           Index LA diam:        3.50 cm 1.96 cm/m   RA Area:     10.70 cm LA Vol (A2C):   74.1 ml 41.45 ml/m  RA Volume:   19.40 ml  10.85 ml/m LA Vol (A4C):   68.5 ml 38.31 ml/m LA Biplane Vol: 72.6 ml 40.61 ml/m  AORTIC VALVE AV Area (Vmax):    2.80 cm AV Area (Vmean):   2.77 cm AV Area (VTI):     2.65 cm AV Vmax:           140.00 cm/s AV Vmean:           93.650 cm/s AV VTI:            0.258 m AV Peak Grad:      7.8 mmHg AV Mean Grad:      4.5 mmHg LVOT Vmax:         125.00 cm/s LVOT Vmean:        82.600 cm/s LVOT VTI:          0.218 m LVOT/AV VTI ratio: 0.84  AORTA Ao Root diam: 2.80 cm MITRAL VALVE                TRICUSPID VALVE MV Area (PHT): 2.43 cm     TR Peak grad:   51.0 mmHg MV Decel Time: 312 msec     TR Vmax:        357.00 cm/s MV E velocity: 71.70 cm/s MV A velocity: 110.00 cm/s  SHUNTS MV E/A ratio:  0.65         Systemic VTI:  0.22 m                             Systemic Diam: 2.00 cm Sunit Tolia Electronically signed by Madonna Large Signature Date/Time: 01/01/2024/9:24:51 AM    Final     Microbiology: Recent Results (from the past 240 hours)  MRSA Next Gen by PCR, Nasal     Status: None   Collection Time: 12/31/23  3:03 PM   Specimen: Nasal Mucosa; Nasal Swab  Result Value Ref Range Status   MRSA by PCR Next Gen NOT DETECTED NOT DETECTED Final    Comment: (NOTE) The GeneXpert MRSA Assay (FDA approved for NASAL specimens only), is one component of a comprehensive MRSA colonization surveillance program. It is not intended to diagnose MRSA infection nor to guide or monitor treatment for MRSA infections. Test performance is not FDA approved in patients less than 17 years old. Performed at [facility], [address], [place], [place] [address]      Labs: Basic Metabolic Panel: Recent Labs  Lab 12/31/23 0949 12/31/23 1527 01/02/24 0919 01/02/24 1701 01/03/24 0350 01/04/24 0413 01/05/24 0402  NA 118*   < > 126* 124* 127* 128* 131*  K 4.8   < > 4.3 4.2 3.7 3.7 3.7  CL 90*   < > 100 98 101 102 104  CO2 22   < > 17* 17* 21* 21* 21*  GLUCOSE 99   < > 114* 124* 125* 90 74  BUN 46*   < > 48* 50* 49* 53* 55*  CREATININE 2.64*   < > 2.76* 2.82* 2.77* 2.89* 2.82*  CALCIUM  9.6   < > 9.7 9.7 9.2 9.2 9.3  MG 2.2  --   --   --   --  2.2  --   PHOS  --   --   --   --  3.6  --  4.0   < > = values in this  interval not displayed.   Liver Function Tests: Recent Labs  Lab 12/31/23 0949 01/01/24 0304 01/02/24 0047 01/03/24 0350 01/04/24 0413 01/05/24 0402  AST 21 14* 15  --  15  --   ALT 11 11 9   --  9  --   ALKPHOS 85 67 67  --  65  --   BILITOT 0.7 0.8 0.8  --  0.7  --   PROT 7.5 5.6* 5.8*  --  5.7*  --   ALBUMIN 3.7 2.9* 2.9* 3.0* 2.9* 2.9*   No results for input(s): LIPASE, AMYLASE in the last 168 hours. No results for input(s): AMMONIA in the last 168 hours. CBC: Recent Labs  Lab 12/31/23 0949 01/01/24 0304 01/02/24 0047 01/04/24 0413 01/05/24 0402  WBC 4.1 4.4 5.5 5.3 5.4  NEUTROABS 2.7  --   --   --   --   HGB 10.4* 9.0* 8.9* 8.6* 8.5*  HCT 32.9* 28.4* 28.8* 26.9* 27.1*  MCV 81.8 82.6 85.5 82.8 85.2  PLT 278 249 256 284 301   Cardiac Enzymes: No results for input(s): CKTOTAL, CKMB, CKMBINDEX, TROPONINI in the last 168 hours. BNP: BNP (last 3 results) No results for input(s): BNP in the last 8760 hours.  ProBNP (last 3 results) No results for input(s): PROBNP in the last 8760 hours.  CBG: No results for input(s): GLUCAP in the last 168 hours.     Signed:  Toribio Hummer MD.  [facility] 01/05/2024, 1:32 PM

## 2024-01-06 ENCOUNTER — Other Ambulatory Visit (HOSPITAL_BASED_OUTPATIENT_CLINIC_OR_DEPARTMENT_OTHER): Payer: Self-pay

## 2024-01-06 ENCOUNTER — Other Ambulatory Visit (HOSPITAL_COMMUNITY): Payer: Self-pay

## 2024-01-06 ENCOUNTER — Telehealth: Payer: Self-pay

## 2024-01-06 NOTE — Transitions of Care (Post Inpatient/ED Visit) (Signed)
   01/06/2024  Name: Danny Rhodes MRN: [id number] DOB: June 17, 1926  Today's TOC FU Call Status: Today's TOC FU Call Status:: Unsuccessful Call (1st Attempt) Unsuccessful Call (1st Attempt) Date: 01/06/24  Attempted to reach the patient regarding the most recent Inpatient/ED visit.  Follow Up Plan: Additional outreach attempts will be made to reach the patient to complete the Transitions of Care (Post Inpatient/ED visit) call.   Grigor Lipschutz J. Tyreona Panjwani RN, MSN [facility], [facility] RN Care Manager Direct Dial: [phone]  Fax: [phone] Website: [email or web address]

## 2024-01-09 ENCOUNTER — Telehealth: Payer: Self-pay

## 2024-01-09 NOTE — Transitions of Care (Post Inpatient/ED Visit) (Signed)
   01/09/2024  Name: Danny Rhodes MRN: [id number] DOB: 1927/04/04  Today's TOC FU Call Status: Today's TOC FU Call Status:: Successful TOC FU Call Completed TOC FU Call Complete Date: 01/09/24 Patient's Name and Date of Birth confirmed.  Transition Care Management Follow-up Telephone Call Date of Discharge: 01/05/24 Discharge Facility: [facility]) Type of Discharge: Inpatient Admission Primary Inpatient Discharge Diagnosis:: Hyponatremia How have you been since you were released from the hospital?: Better Any questions or concerns?: No  Items Reviewed: Did you receive and understand the discharge instructions provided?: Yes Medications obtained,verified, and reconciled?: No Medications Not Reviewed Reasons:: Other: (Patient states his daughter does his medication and she is not available to review medications) Any new allergies since your discharge?: No Dietary orders reviewed?: Yes Type of Diet Ordered:: 1500 cc fluid restriction (reviewed with patient fluid restriction) Do you have support at home?: Yes People in Home [RPT]: spouse, child(ren), adult Name of Support/Comfort Primary Source: Dorothe daughter  Medications Reviewed Today: Patient states daughter Dorothe does medications and she is not available to review with CM. Medications Reviewed Today   Medications were not reviewed in this encounter     Home Care and Equipment/Supplies: Were Home Health Services Ordered?: NA Any new equipment or medical supplies ordered?: NA  Functional Questionnaire: Do you need assistance with bathing/showering or dressing?: No Do you need assistance with meal preparation?: No Do you need assistance with eating?: No Do you have difficulty maintaining continence: No Do you need assistance with getting out of bed/getting out of a chair/moving?: No Do you have difficulty managing or taking your medications?: Yes (daughter Dorothe does medications)  Follow up appointments  reviewed: PCP Follow-up appointment confirmed?: Yes Date of PCP follow-up appointment?: 01/10/24 Follow-up Provider: Dr. Gerome Specialist [facility] Follow-up appointment confirmed?: No Do you need transportation to your follow-up appointment?: No  SDOH Interventions Today    Flowsheet Row Most Recent Value  SDOH Interventions   Food Insecurity Interventions Intervention Not Indicated  Housing Interventions Intervention Not Indicated  Transportation Interventions Intervention Not Indicated  Utilities Interventions Intervention Not Indicated    Ryanna Teschner J. Ravynn Hogate RN, MSN [facility]  [facility], [facility] RN Care Manager Direct Dial: [phone]  Fax: [phone] Website: [email or web address]

## 2024-01-09 NOTE — Patient Instructions (Signed)
 Visit Information  Thank you for taking time to visit with me today. Please don't hesitate to contact me if I can be of assistance to you.  Call your provider right away if you have any of the following: Severe tiredness Fainting Weakness Dizziness Loss of appetite Nausea or vomiting Confusion, forgetfulness, irritability, or restlessness Muscle spasms, cramping, or twitching Seizures Walking abnormally   Patient verbalizes understanding of instructions and care plan provided today and agrees to view in MyChart. Active MyChart status and patient understanding of how to access instructions and care plan via MyChart confirmed with patient.     The patient has been provided with contact information for the care management team and has been advised to call with any health related questions or concerns.   Please call the care guide team at [phone] if you need to cancel or reschedule your appointment.   Please call the Suicide and Crisis Lifeline: 988 if you are experiencing a Mental Health or Behavioral Health Crisis or need someone to talk to.  Lyllian Gause J. Kamryn Messineo RN, MSN [facility], [facility] RN Care Manager Direct Dial: [phone]  Fax: [phone] Website: [email or web address]

## 2024-01-10 ENCOUNTER — Ambulatory Visit (HOSPITAL_COMMUNITY)
Admission: RE | Admit: 2024-01-10 | Discharge: 2024-01-10 | Disposition: A | Discharge status: Home / Self Care | Source: Ambulatory Visit | Attending: Nephrology | Admitting: Nephrology

## 2024-01-10 VITALS — BP 185/51 | HR 61 | Temp 97.3°F | Resp 16

## 2024-01-10 DIAGNOSIS — D631 Anemia in chronic kidney disease: Secondary | ICD-10-CM | POA: Insufficient documentation

## 2024-01-10 DIAGNOSIS — Z862 Personal history of diseases of the blood and blood-forming organs and certain disorders involving the immune mechanism: Secondary | ICD-10-CM | POA: Insufficient documentation

## 2024-01-10 DIAGNOSIS — N184 Chronic kidney disease, stage 4 (severe): Secondary | ICD-10-CM | POA: Insufficient documentation

## 2024-01-10 DIAGNOSIS — N189 Chronic kidney disease, unspecified: Secondary | ICD-10-CM | POA: Insufficient documentation

## 2024-01-10 LAB — POCT HEMOGLOBIN-HEMACUE: Hemoglobin: 10.3 g/dL — ABNORMAL LOW (ref 13.0–17.0)

## 2024-01-10 MED ORDER — EPOETIN ALFA-EPBX 40000 UNIT/ML IJ SOLN
30000.0000 [IU] | INTRAMUSCULAR | Status: DC
  Administered 2024-01-10: 30000 [IU] via SUBCUTANEOUS

## 2024-01-10 MED ORDER — EPOETIN ALFA-EPBX 40000 UNIT/ML IJ SOLN
INTRAMUSCULAR | Status: AC
  Filled 2024-01-10: qty 1

## 2024-01-11 DIAGNOSIS — N184 Chronic kidney disease, stage 4 (severe): Secondary | ICD-10-CM | POA: Diagnosis not present

## 2024-01-27 ENCOUNTER — Ambulatory Visit (HOSPITAL_COMMUNITY)
Admit: 2024-01-27 | Discharge: 2024-01-27 | Disposition: A | Discharge status: Home / Self Care | Attending: Internal Medicine | Admitting: Internal Medicine

## 2024-01-27 VITALS — BP 180/68 | HR 68 | Ht 66.0 in | Wt 153.6 lb

## 2024-01-27 DIAGNOSIS — I4891 Unspecified atrial fibrillation: Secondary | ICD-10-CM | POA: Diagnosis not present

## 2024-01-27 DIAGNOSIS — I480 Paroxysmal atrial fibrillation: Secondary | ICD-10-CM | POA: Diagnosis not present

## 2024-01-27 DIAGNOSIS — I10 Essential (primary) hypertension: Secondary | ICD-10-CM

## 2024-01-27 DIAGNOSIS — D6869 Other thrombophilia: Secondary | ICD-10-CM | POA: Diagnosis not present

## 2024-01-27 MED ORDER — AMLODIPINE BESYLATE 10 MG PO TABS: 10.0000 mg | ORAL_TABLET | Freq: Every morning | ORAL | 0 refills | Status: AC

## 2024-01-27 MED ORDER — APIXABAN 2.5 MG PO TABS: 2.5000 mg | ORAL_TABLET | Freq: Two times a day (BID) | ORAL | 3 refills | Status: DC

## 2024-01-27 NOTE — Patient Instructions (Signed)
 Increase amlodipine  to 10 mg once daily and follow up with Dr. Rayburn with this med for BP

## 2024-01-27 NOTE — Progress Notes (Signed)
 Primary Care Physician: Gerome Brunet, DO Primary Cardiologist: None Electrophysiologist: None     Referring Physician: Sebastian Toribio GAILS, MD     Danny Rhodes is a 89 y.o. male with a history of hyponatremia, aortic arch calcifications by CXR, HTN, gout, CKD, anemia of renal disease, and atrial fibrillation who presents for consultation in the [facility] Atrial Fibrillation [facility]. Hospital admission 7/26-31/2025 for weakness and dizziness found to have symptomatic hyponatremia and new Afib with RVR placed started on Eliquis  2.5 mg BID. ASA stopped. Patient is on Eliquis  2.5 mg BID for a CHADS2VASC score of 4.  On evaluation today, patient is currently in NSR. He feels better since hospital discharge. No bleeding issues on Eliquis .   Today, he denies symptoms of palpitations, chest pain, shortness of breath, orthopnea, PND, lower extremity edema, dizziness, presyncope, syncope, snoring, daytime somnolence, bleeding, or neurologic sequela. The patient is tolerating medications without difficulties and is otherwise without complaint today.    he has a BMI of Body mass index is 24.79 kg/m.SABRA Filed Weights   01/27/24 1012  Weight: 69.7 kg    Current Outpatient Medications  Medication Sig Dispense Refill   acetaminophen  (TYLENOL ) 500 MG tablet Take 500-1,000 mg by mouth 2 (two) times daily as needed for mild pain (pain score 1-3) (or headaches). (Patient taking differently: Take 500-1,000 mg by mouth as needed for mild pain (pain score 1-3) (or headaches).)     allopurinol  (ZYLOPRIM ) 100 MG tablet Take 100 mg by mouth every morning.     amLODipine  (NORVASC ) 5 MG tablet Take 1 tablet (5 mg total) by mouth in the morning. 90 tablet 0   apixaban  (ELIQUIS ) 2.5 MG TABS tablet Take 1 tablet (2.5 mg total) by mouth 2 (two) times daily. 60 tablet 2   ascorbic acid (VITAMIN C) 500 MG tablet Take 500 mg by mouth daily.     atorvastatin  (LIPITOR) 20 MG tablet Take 20 mg by mouth every  morning.  2   carvedilol  (COREG ) 6.25 MG tablet Take 6.25 mg by mouth 2 (two) times daily with a meal.     dorzolamide  (TRUSOPT ) 2 % ophthalmic solution Place 1 drop into both eyes 2 (two) times daily.     feeding supplement (ENSURE PLUS HIGH PROTEIN) LIQD Take 237 mLs by mouth 2 (two) times daily between meals.     Ferrous Sulfate  Dried (HIGH POTENCY IRON) 65 MG TABS Take 130 mg by mouth daily after breakfast.     latanoprost  (XALATAN ) 0.005 % ophthalmic solution Place 1 drop into both eyes at bedtime.     mirtazapine (REMERON) 15 MG tablet 1 tablet at bedtime Orally Once a day for sleep and appetite; Duration: 90 days     Misc Natural Products (BEET ROOT) 500 MG CAPS Take 1,000 mg by mouth daily after breakfast.     pantoprazole  (PROTONIX ) 40 MG tablet Take 1 tablet (40 mg total) by mouth daily at 6 (six) AM. 30 tablet 0   torsemide  (DEMADEX ) 100 MG tablet Take 1 tablet (100 mg total) by mouth daily. 30 tablet 1   No current facility-administered medications for this encounter.    Atrial Fibrillation Management history:  Previous antiarrhythmic drugs: none Previous cardioversions: none Previous ablations: none Anticoagulation history: Eliquis    ROS- All systems are reviewed and negative except as per the HPI above.  Physical Exam: BP (!) 180/68   Pulse 68   Ht 5' 6 (1.676 m)   Wt 69.7 kg   BMI 24.79  kg/m   GEN: Well nourished, well developed in no acute distress. Large hernia noted. NECK: No JVD; No carotid bruits CARDIAC: Regular rate and rhythm, no murmurs, rubs, gallops RESPIRATORY:  Clear to auscultation without rales, wheezing or rhonchi  ABDOMEN: Soft, non-tender, non-distended EXTREMITIES:  No edema; No deformity   EKG today demonstrates  Vent. rate 68 BPM PR interval 370 ms QRS duration 78 ms QT/QTcB 388/412 ms P-R-T axes 80 44 24 Sinus rhythm with 1st degree A-V block Otherwise normal ECG When compared with ECG of 03-Jan-2024 10:14, Sinus rhythm has  replaced Atrial flutter   Echo 01/01/24 demonstrated   1. Left ventricular ejection fraction, by estimation, is 60 to 65%. The  left ventricle has normal function. The left ventricle has no regional  wall motion abnormalities. Left ventricular diastolic parameters are  consistent with Grade II diastolic  dysfunction (pseudonormalization).   2. Right ventricular systolic function is normal. The right ventricular  size is normal. Mildly increased right ventricular wall thickness. There  is moderately elevated pulmonary artery systolic pressure.   3. Left atrial size was mildly dilated.   4. The mitral valve is grossly normal. No evidence of mitral valve  regurgitation. No evidence of mitral stenosis.   5. Tricuspid valve regurgitation is moderate.   6. The aortic valve is tricuspid. Aortic valve regurgitation is not  visualized. Aortic valve sclerosis is present, with no evidence of aortic  valve stenosis.   7. The inferior vena cava is normal in size with greater than 50%  respiratory variability, suggesting right atrial pressure of 3 mmHg.    ASSESSMENT & PLAN CHA2DS2-VASc Score = 4  The patient's score is based upon: CHF History: 0 HTN History: 1 Diabetes History: 0 Stroke History: 0 Vascular Disease History: 1 Age Score: 2 Gender Score: 0       ASSESSMENT AND PLAN: Paroxysmal Atrial Fibrillation (ICD10:  I48.0) The patient's CHA2DS2-VASc score is 4, indicating a 4.8% annual risk of stroke.    Patient is currently in NSR. Education provided about Afib. After discussion, we will proceed with conservative observation at this time.  Continue coreg  6.25 mg BID.   Secondary Hypercoagulable State (ICD10:  D68.69) The patient is at significant risk for stroke/thromboembolism based upon his CHA2DS2-VASc Score of 4.  Continue Apixaban  (Eliquis ).  We discussed the reasoning behind anticoagulation in the setting of stroke prevention related to Afib. We discussed the benefits vs  risks of anticoagulation. After discussion, patient would like to continue anticoagulation. Continue Eliquis  2.5 mg BID. Dosage correct based on age and creatinine.  Hypertension Increase amlodipine  to 10 mg daily. Trend BP at home.   Follow up 3 months Afib clinic.   Terra Pac, PA-C  Afib Clinic [facility] [address] [place], [place] [address] [phone]

## 2024-01-31 ENCOUNTER — Other Ambulatory Visit (HOSPITAL_COMMUNITY): Payer: Self-pay

## 2024-02-03 ENCOUNTER — Other Ambulatory Visit (HOSPITAL_COMMUNITY): Payer: Self-pay

## 2024-02-03 MED ORDER — APIXABAN 2.5 MG PO TABS
2.5000 mg | ORAL_TABLET | Freq: Two times a day (BID) | ORAL | Status: DC
Start: 2024-01-30 — End: 2024-03-30

## 2024-02-07 ENCOUNTER — Encounter (HOSPITAL_COMMUNITY)
Admission: RE | Admit: 2024-02-07 | Discharge: 2024-02-07 | Disposition: A | Discharge status: Home / Self Care | Source: Ambulatory Visit | Attending: Nephrology | Admitting: Nephrology

## 2024-02-07 ENCOUNTER — Other Ambulatory Visit (HOSPITAL_COMMUNITY): Payer: Self-pay

## 2024-02-07 VITALS — BP 157/50 | HR 63 | Temp 97.7°F | Resp 16

## 2024-02-07 DIAGNOSIS — E871 Hypo-osmolality and hyponatremia: Secondary | ICD-10-CM | POA: Diagnosis not present

## 2024-02-07 DIAGNOSIS — N184 Chronic kidney disease, stage 4 (severe): Secondary | ICD-10-CM

## 2024-02-07 DIAGNOSIS — N2581 Secondary hyperparathyroidism of renal origin: Secondary | ICD-10-CM | POA: Diagnosis not present

## 2024-02-07 DIAGNOSIS — N189 Chronic kidney disease, unspecified: Secondary | ICD-10-CM | POA: Diagnosis not present

## 2024-02-07 DIAGNOSIS — I129 Hypertensive chronic kidney disease with stage 1 through stage 4 chronic kidney disease, or unspecified chronic kidney disease: Secondary | ICD-10-CM | POA: Diagnosis not present

## 2024-02-07 DIAGNOSIS — Z862 Personal history of diseases of the blood and blood-forming organs and certain disorders involving the immune mechanism: Secondary | ICD-10-CM | POA: Diagnosis not present

## 2024-02-07 DIAGNOSIS — D631 Anemia in chronic kidney disease: Secondary | ICD-10-CM | POA: Insufficient documentation

## 2024-02-07 LAB — RENAL FUNCTION PANEL
Albumin: 3.5 g/dL (ref 3.5–5.0)
Anion gap: 14 (ref 5–15)
BUN: 86 mg/dL — ABNORMAL HIGH (ref 8–23)
CO2: 21 mmol/L — ABNORMAL LOW (ref 22–32)
Calcium: 10.4 mg/dL — ABNORMAL HIGH (ref 8.9–10.3)
Chloride: 104 mmol/L (ref 98–111)
Creatinine, Ser: 4.92 mg/dL — ABNORMAL HIGH (ref 0.61–1.24)
GFR, Estimated: 10 mL/min — ABNORMAL LOW (ref >60)
Glucose, Bld: 121 mg/dL — ABNORMAL HIGH (ref 70–99)
Phosphorus: 5.3 mg/dL — ABNORMAL HIGH (ref 2.5–4.6)
Potassium: 4 mmol/L (ref 3.5–5.1)
Sodium: 139 mmol/L (ref 135–145)

## 2024-02-07 LAB — IRON AND TIBC
Iron: 54 ug/dL (ref 45–182)
Saturation Ratios: 25 % (ref 17.9–39.5)
TIBC: 214 ug/dL — ABNORMAL LOW (ref 250–450)
UIBC: 160 ug/dL

## 2024-02-07 LAB — POCT HEMOGLOBIN-HEMACUE: Hemoglobin: 9.8 g/dL — ABNORMAL LOW (ref 13.0–17.0)

## 2024-02-07 LAB — FERRITIN: Ferritin: 615 ng/mL — ABNORMAL HIGH (ref 24–336)

## 2024-02-07 MED ORDER — EPOETIN ALFA-EPBX 20000 UNIT/ML IJ SOLN
30000.0000 [IU] | INTRAMUSCULAR | Status: DC
  Administered 2024-02-07: 30000 [IU] via SUBCUTANEOUS

## 2024-02-07 MED ORDER — EPOETIN ALFA-EPBX 20000 UNIT/ML IJ SOLN
INTRAMUSCULAR | Status: AC
Start: 2024-02-07 — End: 2024-02-07
  Filled 2024-02-07: qty 1

## 2024-02-07 MED ORDER — EPOETIN ALFA-EPBX 40000 UNIT/ML IJ SOLN: 30000.0000 [IU] | INTRAMUSCULAR | Status: DC

## 2024-02-07 MED ORDER — EPOETIN ALFA-EPBX 10000 UNIT/ML IJ SOLN
INTRAMUSCULAR | Status: AC
  Filled 2024-02-07: qty 1

## 2024-02-08 LAB — PTH, INTACT AND CALCIUM
Calcium, Total (PTH): 10.5 mg/dL — ABNORMAL HIGH (ref 8.6–10.2)
PTH: 401 pg/mL — ABNORMAL HIGH (ref 15–65)

## 2024-02-10 ENCOUNTER — Telehealth: Payer: Self-pay | Admitting: Pharmacy Technician

## 2024-02-10 NOTE — Telephone Encounter (Signed)
 Auth Submission: NO AUTH NEEDED Site of care: Site of care: [facility] Payer: [employer] MEDICARE Medication & CPT/J Code(s) submitted: RETACRIT  Q5106 Diagnosis Code: N18.4 Route of submission (phone, fax, portal):  Phone # Fax # Auth type: Buy/Bill HB Units/visits requested: 30,000U Q28D Reference number: no auth neeed ref: [id number] Approval from: 11/14/23 to 06/06/24

## 2024-02-15 DIAGNOSIS — M25551 Pain in right hip: Secondary | ICD-10-CM | POA: Diagnosis not present

## 2024-02-16 ENCOUNTER — Other Ambulatory Visit (HOSPITAL_COMMUNITY): Payer: Self-pay | Admitting: Nephrology

## 2024-02-16 ENCOUNTER — Telehealth (HOSPITAL_COMMUNITY): Payer: Self-pay

## 2024-02-16 DIAGNOSIS — D631 Anemia in chronic kidney disease: Secondary | ICD-10-CM | POA: Insufficient documentation

## 2024-02-16 NOTE — Telephone Encounter (Signed)
 error

## 2024-02-20 DIAGNOSIS — H35361 Drusen (degenerative) of macula, right eye: Secondary | ICD-10-CM | POA: Diagnosis not present

## 2024-02-20 DIAGNOSIS — H353221 Exudative age-related macular degeneration, left eye, with active choroidal neovascularization: Secondary | ICD-10-CM | POA: Diagnosis not present

## 2024-02-20 DIAGNOSIS — H401133 Primary open-angle glaucoma, bilateral, severe stage: Secondary | ICD-10-CM | POA: Diagnosis not present

## 2024-02-20 DIAGNOSIS — H35033 Hypertensive retinopathy, bilateral: Secondary | ICD-10-CM | POA: Diagnosis not present

## 2024-02-20 DIAGNOSIS — H524 Presbyopia: Secondary | ICD-10-CM | POA: Diagnosis not present

## 2024-03-06 ENCOUNTER — Encounter (HOSPITAL_COMMUNITY)
Admission: RE | Admit: 2024-03-06 | Discharge: 2024-03-06 | Disposition: A | Discharge status: Home / Self Care | Source: Ambulatory Visit | Attending: Nephrology | Admitting: Nephrology

## 2024-03-06 VITALS — BP 172/51 | HR 64 | Temp 97.1°F | Resp 16

## 2024-03-06 DIAGNOSIS — N189 Chronic kidney disease, unspecified: Secondary | ICD-10-CM | POA: Diagnosis not present

## 2024-03-06 DIAGNOSIS — N184 Chronic kidney disease, stage 4 (severe): Secondary | ICD-10-CM | POA: Diagnosis not present

## 2024-03-06 DIAGNOSIS — Z862 Personal history of diseases of the blood and blood-forming organs and certain disorders involving the immune mechanism: Secondary | ICD-10-CM | POA: Diagnosis not present

## 2024-03-06 DIAGNOSIS — D631 Anemia in chronic kidney disease: Secondary | ICD-10-CM

## 2024-03-06 LAB — POCT HEMOGLOBIN-HEMACUE: Hemoglobin: 10 g/dL — ABNORMAL LOW (ref 13.0–17.0)

## 2024-03-06 MED ORDER — EPOETIN ALFA-EPBX 40000 UNIT/ML IJ SOLN: 30000.0000 [IU] | Freq: Once | INTRAMUSCULAR | Status: DC

## 2024-03-06 MED ORDER — EPOETIN ALFA-EPBX 20000 UNIT/ML IJ SOLN
20000.0000 [IU] | Freq: Once | INTRAMUSCULAR | Status: AC
  Administered 2024-03-06: 20000 [IU] via SUBCUTANEOUS

## 2024-03-06 MED ORDER — EPOETIN ALFA-EPBX 10000 UNIT/ML IJ SOLN
10000.0000 [IU] | Freq: Once | INTRAMUSCULAR | Status: AC
  Administered 2024-03-06: 10000 [IU] via SUBCUTANEOUS

## 2024-03-06 MED ORDER — EPOETIN ALFA-EPBX 20000 UNIT/ML IJ SOLN
INTRAMUSCULAR | Status: AC
  Filled 2024-03-06: qty 1

## 2024-03-06 MED ORDER — EPOETIN ALFA-EPBX 10000 UNIT/ML IJ SOLN
INTRAMUSCULAR | Status: AC
  Filled 2024-03-06: qty 1

## 2024-03-06 MED ORDER — CLONIDINE HCL 0.1 MG PO TABS: 0.1000 mg | ORAL_TABLET | ORAL | Status: DC | PRN

## 2024-03-07 ENCOUNTER — Other Ambulatory Visit (HOSPITAL_COMMUNITY): Payer: Self-pay

## 2024-03-13 ENCOUNTER — Other Ambulatory Visit (HOSPITAL_COMMUNITY): Payer: Self-pay

## 2024-03-13 NOTE — Progress Notes (Signed)
 [facility] - Clinic Note  03/21/2024     CHIEF COMPLAINT Patient presents for Retina Follow Up    HISTORY OF PRESENT ILLNESS: Danny Rhodes is a 89 y.o. male who presents to the clinic today for:  HPI     Retina Follow Up   Patient presents with  Wet AMD.  In left eye.  This started 9 months ago.  I, the attending physician,  performed the HPI with the patient and updated documentation appropriately.        Comments   Patient here for 9 months retina follow up for exu ARMD OS. Patient states vision  the same. Can't tell any difference.       Last edited by Valdemar Rogue, MD on 03/25/2024  5:54 PM.     Pt states vision is stable, everything is going along fine.   Referring physician: Fleeta Zerita DASEN, Md [address] [place],  [place] [address]  HISTORICAL INFORMATION:   Selected notes from the MEDICAL RECORD NUMBER Referred by Dr. Lelon LEE: 07.30.21 BCVA OD 20/40, OS 20/50 Ocular Hx- POAG OU severe, Cataracts OU, HTN Retinopathy OU, NVAMD OS, Band Keratopathy OU   CURRENT MEDICATIONS: Current Outpatient Medications (Ophthalmic Drugs)  Medication Sig   dorzolamide  (TRUSOPT ) 2 % ophthalmic solution Place 1 drop into both eyes 2 (two) times daily.   latanoprost  (XALATAN ) 0.005 % ophthalmic solution Place 1 drop into both eyes at bedtime.   No current facility-administered medications for this visit. (Ophthalmic Drugs)   Current Outpatient Medications (Other)  Medication Sig   allopurinol  (ZYLOPRIM ) 100 MG tablet Take 100 mg by mouth every morning.   amLODipine  (NORVASC ) 10 MG tablet Take 1 tablet (10 mg total) by mouth in the morning.   apixaban  (ELIQUIS ) 2.5 MG TABS tablet Take 1 tablet (2.5 mg total) by mouth 2 (two) times daily.   ascorbic acid (VITAMIN C) 500 MG tablet Take 500 mg by mouth daily.   atorvastatin  (LIPITOR) 20 MG tablet Take 20 mg by mouth every morning.   carvedilol  (COREG ) 6.25 MG tablet Take 6.25 mg by mouth 2  (two) times daily with a meal.   feeding supplement (ENSURE PLUS HIGH PROTEIN) LIQD Take 237 mLs by mouth 2 (two) times daily between meals.   Ferrous Sulfate  Dried (HIGH POTENCY IRON) 65 MG TABS Take 130 mg by mouth daily after breakfast.   mirtazapine (REMERON) 15 MG tablet 1 tablet at bedtime Orally Once a day for sleep and appetite; Duration: 90 days   Misc Natural Products (BEET ROOT) 500 MG CAPS Take 1,000 mg by mouth daily after breakfast.   pantoprazole  (PROTONIX ) 40 MG tablet Take 1 tablet (40 mg total) by mouth daily at 6 (six) AM.   acetaminophen  (TYLENOL ) 500 MG tablet Take 500-1,000 mg by mouth 2 (two) times daily as needed for mild pain (pain score 1-3) (or headaches). (Patient taking differently: Take 500-1,000 mg by mouth as needed for mild pain (pain score 1-3) (or headaches).)   torsemide  (DEMADEX ) 100 MG tablet Take 1 tablet (100 mg total) by mouth daily.   No current facility-administered medications for this visit. (Other)   REVIEW OF SYSTEMS: ROS   Positive for: Genitourinary, Eyes Negative for: Constitutional, Gastrointestinal, Neurological, Skin, Musculoskeletal, HENT, Endocrine, Cardiovascular, Respiratory, Psychiatric, Allergic/Imm, Heme/Lymph Last edited by Orval Asberry RAMAN, COA on 03/21/2024  8:27 AM.     ALLERGIES No Known Allergies  PAST MEDICAL HISTORY Past Medical History:  Diagnosis Date   CKD (chronic  kidney disease), symptom management only, stage 4 (severe) (HCC)    Glaucoma    POAG OU   History of anemia due to chronic kidney disease    Hypertensive retinopathy    OU   Past Surgical History:  Procedure Laterality Date   CATARACT EXTRACTION Bilateral    EYE SURGERY Bilateral    Cat Sx   Hip replacement Right    20 yrs ago   TOTAL HIP ARTHROPLASTY Right 12/01/2021   Procedure: EXCISIONAL TOTAL HIP ARTHROPLASTY WITH POSTERIOR APPROACH;  Surgeon: Ernie Cough, MD;  Location: [facility] OR;  Service: Orthopedics;  Laterality: Right;   FAMILY  HISTORY History reviewed. No pertinent family history.  SOCIAL HISTORY Social History   Tobacco Use   Smoking status: Never   Smokeless tobacco: Never  Vaping Use   Vaping status: Never Used  Substance Use Topics   Alcohol use: Not Currently   Drug use: Never       OPHTHALMIC EXAM:  Base Eye Exam     Visual Acuity (Snellen - Linear)       Right Left   Dist [place] 20/30 20/30 -2   Dist ph [place] 20/25 -2 20/25 -2         Tonometry (Tonopen, 8:25 AM)       Right Left   Pressure 13 11         Pupils       Dark Light Shape React APD   Right 3 2 Round Brisk None   Left 3 2 Round Brisk None         Visual Fields (Counting fingers)       Left Right    Full Full         Extraocular Movement       Right Left    Full, Ortho Full, Ortho         Neuro/Psych     Oriented x3: Yes   Mood/Affect: Normal         Dilation     Both eyes: 1.0% Mydriacyl, 2.5% Phenylephrine  @ 8:25 AM           Slit Lamp and Fundus Exam     Slit Lamp Exam       Right Left   Lids/Lashes Dermatochalasis - upper lid Dermatochalasis - upper lid   Conjunctiva/Sclera Melanosis Mild melanosis, temporal pinguecula   Cornea arcus, well healed cataract wound, early band K, central haze arcus, trace Punctate epithelial erosions, early band K nasally, well healed temporal cataract wounds, mild central haze   Anterior Chamber deep and clear, narrow temporal angle deep and narrow temporal angle   Iris Round and dilated Round and dilated   Lens PC IOL in good position PC IOL in good position   Anterior Vitreous Vitreous syneresis Vitreous syneresis         Fundus Exam       Right Left   Disc 3+Pallor, +cupping, +PPA/PPP, thin superior rim, Sharp rim 2-3+Pallor, +cupping, scattered PPP, thin inferior rim   C/D Ratio 0.7 0.75   Macula Flat, Blunted foveal reflex, RPE mottling, clumping and atrophy, Drusen, No heme or edema Blunted foveal reflex, CNV with focal pigment  ring--inactive, no SRF surrounding, RPE mottling, clumping and atrophy, no heme, no fluid   Vessels attenuated, Tortuous, copper wiring, mild AV crossing changes attenuated, mild tortuosity, mild AV crossing changes   Periphery Attached, No heme, reticular degeneration Attached, focal perivascular pigment clumping temporally, No heme  Refraction     Wearing Rx       Sphere   Right None   Left None           IMAGING AND PROCEDURES  Imaging and Procedures for 03/21/2024  OCT, Retina - OU - Both Eyes       Right Eye Quality was good. Central Foveal Thickness: 219. Progression has been stable. Findings include normal foveal contour, no IRF, no SRF, retinal drusen (Trace ERM; partial PVD).   Left Eye Quality was good. Central Foveal Thickness: 201. Progression has been stable. Findings include normal foveal contour, no IRF, no SRF, retinal drusen , pigment epithelial detachment, outer retinal atrophy (stable resolution of IRF/SRF overlying focal PED IT fovea; PED slightly decreased in size. ).   Notes *Images captured and stored on drive  Diagnosis / Impression:  OD: NFP, no IRF/SRF OS: stable resolution of IRF/SRF overlying focal PED IT fovea  Clinical management:  See below  Abbreviations: NFP - Normal foveal profile. CME - cystoid macular edema. PED - pigment epithelial detachment. IRF - intraretinal fluid. SRF - subretinal fluid. EZ - ellipsoid zone. ERM - epiretinal membrane. ORA - outer retinal atrophy. ORT - outer retinal tubulation. SRHM - subretinal hyper-reflective material. IRHM - intraretinal hyper-reflective material            ASSESSMENT/PLAN:    ICD-10-CM   1. Choroidal neovascular membrane of left eye  H35.052 OCT, Retina - OU - Both Eyes    2. Exudative age-related macular degeneration of left eye with active choroidal neovascularization (HCC)  H35.3221     3. Essential hypertension  I10     4. Hypertensive retinopathy of both eyes   H35.033     5. Pseudophakia, both eyes  Z96.1     6. Primary open angle glaucoma (POAG) of both eyes, severe stage  H40.1133      1,2. Choroidal neovascular membrane w/ +SRF -- stably inactive/resolved  - idiopathic CNV vs Exudative age related macular degeneration, OS  - interestingly, OD without drusen and OS with minimal drusen, which we would see in typical ARMD  - s/p IVA OS #1 (08.04.21), #2 (09.02.21), #3 (10.05.21), #4 (11.11.21), #5 (12.23.21), #6 (2.3.22), #7 (03.31.22), #8 (06.09.22)  - BCVA OS 20/25  - OCT shows stable resolution of IRF/SRF overlying focal PED It fovea at 3+ years since last injxn  - recommend holding IVA OS again today -- pt in agreement  - f/u in 9 months, sooner prn -- DFE/OCT  3,4. Hypertensive retinopathy OU - discussed importance of tight BP control - monitor  5. Pseudophakia OU  - s/p CE/IOL ([place], OD 8.11.21; OS: 09.22.21)  - IOL's in good position, doing well  6. POAG OU  - under the expert management of Dr. Fleeta  - IOP 13,11 today  - on dorzolamide  bid OU and latanoprost  qhs OU per Dr Fleeta  Ophthalmic Meds Ordered this visit:  No orders of the defined types were placed in this encounter.    Return in about 9 months (around 12/19/2024) for CNVM OS, DFE, OCT.  There are no Patient Instructions on file for this visit.   Explained the diagnoses, plan, and follow up with the patient and they expressed understanding.  Patient expressed understanding of the importance of proper follow up care.   This document serves as a record of services personally performed by Redell JUDITHANN Hans, MD, PhD. It was created on their behalf by Almetta Pesa, an ophthalmic technician. The creation of  this record is the provider's dictation and/or activities during the visit.    Electronically signed by: Almetta Pesa, OA, 03/25/24  5:55 PM  Redell JUDITHANN Hans, M.D., Ph.D. Diseases & Surgery of the Retina and Vitreous [facility]  I  have reviewed the above documentation for accuracy and completeness, and I agree with the above. Redell JUDITHANN Hans, M.D., Ph.D. 03/25/24 5:55 PM   Abbreviations: M myopia (nearsighted); A astigmatism; H hyperopia (farsighted); P presbyopia; Mrx spectacle prescription;  CTL contact lenses; OD right eye; OS left eye; OU both eyes  XT exotropia; ET esotropia; PEK punctate epithelial keratitis; PEE punctate epithelial erosions; DES dry eye syndrome; MGD meibomian gland dysfunction; ATs artificial tears; PFAT's preservative free artificial tears; NSC nuclear sclerotic cataract; PSC posterior subcapsular cataract; ERM epi-retinal membrane; PVD posterior vitreous detachment; RD retinal detachment; DM diabetes mellitus; DR diabetic retinopathy; NPDR non-proliferative diabetic retinopathy; PDR proliferative diabetic retinopathy; CSME clinically significant macular edema; DME diabetic macular edema; dbh dot blot hemorrhages; CWS cotton wool spot; POAG primary open angle glaucoma; C/D cup-to-disc ratio; HVF humphrey visual field; GVF goldmann visual field; OCT optical coherence tomography; IOP intraocular pressure; BRVO Branch retinal vein occlusion; CRVO central retinal vein occlusion; CRAO central retinal artery occlusion; BRAO branch retinal artery occlusion; RT retinal tear; SB scleral buckle; PPV pars plana vitrectomy; VH Vitreous hemorrhage; PRP panretinal laser photocoagulation; IVK intravitreal kenalog; VMT vitreomacular traction; MH Macular hole;  NVD neovascularization of the disc; NVE neovascularization elsewhere; AREDS age related eye disease study; ARMD age related macular degeneration; POAG primary open angle glaucoma; EBMD epithelial/anterior basement membrane dystrophy; ACIOL anterior chamber intraocular lens; IOL intraocular lens; PCIOL posterior chamber intraocular lens; Phaco/IOL phacoemulsification with intraocular lens placement; PRK photorefractive keratectomy; LASIK laser assisted in situ  keratomileusis; HTN hypertension; DM diabetes mellitus; COPD chronic obstructive pulmonary disease

## 2024-03-14 ENCOUNTER — Telehealth (HOSPITAL_COMMUNITY): Payer: Self-pay | Admitting: Pharmacy Technician

## 2024-03-14 ENCOUNTER — Other Ambulatory Visit (HOSPITAL_COMMUNITY): Payer: Self-pay

## 2024-03-14 ENCOUNTER — Telehealth: Payer: Self-pay | Admitting: Pharmacy Technician

## 2024-03-14 NOTE — Telephone Encounter (Signed)
 Patient Product/process development scientist completed.    The patient is insured through [facility]. Patient has Medicare and is not eligible for a copay card, but may be able to apply for patient assistance or Medicare RX Payment Plan (Patient Must reach out to their plan, if eligible for payment plan), if available.    Ran test claim for Eliquis  2.5 mg and the current 30 day co-pay is $434.00 due to a deductible.   This test claim was processed through [facility]- copay amounts may vary at other pharmacies due to pharmacy/plan contracts, or as the patient moves through the different stages of their insurance plan.     Reyes Sharps, CPHT Pharmacy Technician Patient Advocate Specialist Lead [facility] Patient Advocate Team Direct Number: [phone]  Fax: [phone]

## 2024-03-14 NOTE — Telephone Encounter (Signed)
 PAP: Patient assistance application for Eliquis  through [employer] ([employer]) has been mailed to pt's home address on file. Provider portion of application will be uploaded to media

## 2024-03-15 DIAGNOSIS — M25551 Pain in right hip: Secondary | ICD-10-CM | POA: Diagnosis not present

## 2024-03-20 NOTE — Telephone Encounter (Signed)
 Patient was denied and called to let me know- not met deductible

## 2024-03-21 ENCOUNTER — Ambulatory Visit (INDEPENDENT_AMBULATORY_CARE_PROVIDER_SITE_OTHER): Payer: Medicare Other | Admitting: Ophthalmology

## 2024-03-21 ENCOUNTER — Encounter (INDEPENDENT_AMBULATORY_CARE_PROVIDER_SITE_OTHER): Payer: Self-pay | Admitting: Ophthalmology

## 2024-03-21 DIAGNOSIS — H35033 Hypertensive retinopathy, bilateral: Secondary | ICD-10-CM | POA: Diagnosis not present

## 2024-03-21 DIAGNOSIS — H35052 Retinal neovascularization, unspecified, left eye: Secondary | ICD-10-CM

## 2024-03-21 DIAGNOSIS — Z961 Presence of intraocular lens: Secondary | ICD-10-CM | POA: Diagnosis not present

## 2024-03-21 DIAGNOSIS — H353221 Exudative age-related macular degeneration, left eye, with active choroidal neovascularization: Secondary | ICD-10-CM

## 2024-03-21 DIAGNOSIS — H401133 Primary open-angle glaucoma, bilateral, severe stage: Secondary | ICD-10-CM

## 2024-03-21 DIAGNOSIS — I10 Essential (primary) hypertension: Secondary | ICD-10-CM | POA: Diagnosis not present

## 2024-03-25 ENCOUNTER — Encounter (INDEPENDENT_AMBULATORY_CARE_PROVIDER_SITE_OTHER): Payer: Self-pay | Admitting: Ophthalmology

## 2024-03-30 ENCOUNTER — Other Ambulatory Visit (HOSPITAL_COMMUNITY): Payer: Self-pay

## 2024-03-30 MED ORDER — APIXABAN 2.5 MG PO TABS: 2.5000 mg | ORAL_TABLET | Freq: Two times a day (BID) | ORAL | Status: DC

## 2024-04-03 ENCOUNTER — Ambulatory Visit (HOSPITAL_COMMUNITY)
Admission: RE | Admit: 2024-04-03 | Discharge: 2024-04-03 | Disposition: A | Discharge status: Home / Self Care | Source: Ambulatory Visit | Attending: Nephrology | Admitting: Nephrology

## 2024-04-03 VITALS — BP 178/62 | HR 69 | Temp 97.3°F | Resp 16

## 2024-04-03 DIAGNOSIS — N184 Chronic kidney disease, stage 4 (severe): Secondary | ICD-10-CM | POA: Diagnosis present

## 2024-04-03 DIAGNOSIS — D631 Anemia in chronic kidney disease: Secondary | ICD-10-CM | POA: Insufficient documentation

## 2024-04-03 LAB — POCT HEMOGLOBIN-HEMACUE: Hemoglobin: 9.1 g/dL — ABNORMAL LOW (ref 13.0–17.0)

## 2024-04-03 MED ORDER — EPOETIN ALFA-EPBX 10000 UNIT/ML IJ SOLN
10000.0000 [IU] | Freq: Once | INTRAMUSCULAR | Status: AC
  Administered 2024-04-03: 10000 [IU] via SUBCUTANEOUS

## 2024-04-03 MED ORDER — EPOETIN ALFA-EPBX 20000 UNIT/ML IJ SOLN
INTRAMUSCULAR | Status: AC
  Filled 2024-04-03: qty 1

## 2024-04-03 MED ORDER — EPOETIN ALFA-EPBX 10000 UNIT/ML IJ SOLN
INTRAMUSCULAR | Status: AC
  Filled 2024-04-03: qty 1

## 2024-04-03 MED ORDER — EPOETIN ALFA-EPBX 20000 UNIT/ML IJ SOLN
20000.0000 [IU] | Freq: Once | INTRAMUSCULAR | Status: AC
  Administered 2024-04-03: 20000 [IU] via SUBCUTANEOUS

## 2024-04-18 ENCOUNTER — Other Ambulatory Visit (HOSPITAL_COMMUNITY): Payer: Self-pay | Admitting: Nephrology

## 2024-04-18 ENCOUNTER — Telehealth (HOSPITAL_COMMUNITY): Payer: Self-pay | Admitting: Pharmacy Technician

## 2024-04-18 NOTE — Telephone Encounter (Signed)
 Auth Submission: NO AUTH NEEDED Site of care: [facility] Payer: [employer] MEDICARE Medication & CPT/J Code(s) submitted: Venofer (Iron Sucrose) J1756 Diagnosis Code: D18.4, D63.1 Route of submission (phone, fax, portal):  Phone # Fax # Auth type: Buy/Bill HB Units/visits requested: 200mg  x 1 dose Reference number: [id number]  Approval from: 04/18/2024 to 06/06/24     Dagoberto Armour, CPhT [facility] Phone: [phone] 04/18/2024

## 2024-05-01 ENCOUNTER — Encounter (HOSPITAL_COMMUNITY)
Admission: RE | Admit: 2024-05-01 | Discharge: 2024-05-01 | Disposition: A | Discharge status: Home / Self Care | Source: Ambulatory Visit | Attending: Nephrology | Admitting: Nephrology

## 2024-05-01 ENCOUNTER — Inpatient Hospital Stay (HOSPITAL_COMMUNITY)
Admission: RE | Admit: 2024-05-01 | Discharge: 2024-05-01 | Disposition: A | Source: Ambulatory Visit | Attending: Nephrology

## 2024-05-01 VITALS — BP 156/55 | HR 67 | Temp 97.9°F | Resp 16

## 2024-05-01 VITALS — BP 153/49 | HR 50 | Resp 16

## 2024-05-01 DIAGNOSIS — N184 Chronic kidney disease, stage 4 (severe): Secondary | ICD-10-CM | POA: Diagnosis present

## 2024-05-01 DIAGNOSIS — D631 Anemia in chronic kidney disease: Secondary | ICD-10-CM

## 2024-05-01 LAB — RENAL FUNCTION PANEL
Albumin: 3.4 g/dL — ABNORMAL LOW (ref 3.5–5.0)
Anion gap: 14 (ref 5–15)
BUN: 71 mg/dL — ABNORMAL HIGH (ref 8–23)
CO2: 18 mmol/L — ABNORMAL LOW (ref 22–32)
Calcium: 9.5 mg/dL (ref 8.9–10.3)
Chloride: 103 mmol/L (ref 98–111)
Creatinine, Ser: 4.94 mg/dL — ABNORMAL HIGH (ref 0.61–1.24)
GFR, Estimated: 10 mL/min — ABNORMAL LOW (ref >60)
Glucose, Bld: 92 mg/dL (ref 70–99)
Phosphorus: 5.4 mg/dL — ABNORMAL HIGH (ref 2.5–4.6)
Potassium: 5.3 mmol/L — ABNORMAL HIGH (ref 3.5–5.1)
Sodium: 135 mmol/L (ref 135–145)

## 2024-05-01 LAB — POCT HEMOGLOBIN-HEMACUE: Hemoglobin: 9.1 g/dL — ABNORMAL LOW (ref 13.0–17.0)

## 2024-05-01 MED ORDER — CLONIDINE HCL 0.1 MG PO TABS
ORAL_TABLET | ORAL | Status: AC
Start: 2024-05-01 — End: 2024-05-01
  Filled 2024-05-01: qty 1

## 2024-05-01 MED ORDER — EPOETIN ALFA-EPBX 40000 UNIT/ML IJ SOLN
40000.0000 [IU] | Freq: Once | INTRAMUSCULAR | Status: AC
  Administered 2024-05-01: 40000 [IU] via SUBCUTANEOUS

## 2024-05-01 MED ORDER — EPOETIN ALFA-EPBX 40000 UNIT/ML IJ SOLN
INTRAMUSCULAR | Status: AC
Start: 2024-05-01 — End: 2024-05-01
  Filled 2024-05-01: qty 1

## 2024-05-01 MED ORDER — IRON SUCROSE 200 MG IVPB - SIMPLE MED
200.0000 mg | Freq: Once | Status: AC
  Administered 2024-05-01: 200 mg via INTRAVENOUS
  Filled 2024-05-01: qty 200

## 2024-05-01 MED ORDER — CLONIDINE HCL 0.1 MG PO TABS
0.1000 mg | ORAL_TABLET | ORAL | Status: DC | PRN
  Administered 2024-05-01: 0.1 mg via ORAL

## 2024-05-07 ENCOUNTER — Encounter (HOSPITAL_COMMUNITY): Payer: Self-pay | Admitting: Internal Medicine

## 2024-05-07 ENCOUNTER — Ambulatory Visit (HOSPITAL_COMMUNITY)
Admission: RE | Admit: 2024-05-07 | Discharge: 2024-05-07 | Disposition: A | Source: Ambulatory Visit | Attending: Internal Medicine | Admitting: Internal Medicine

## 2024-05-07 VITALS — BP 158/56 | HR 66 | Ht 66.0 in | Wt 159.6 lb

## 2024-05-07 DIAGNOSIS — I480 Paroxysmal atrial fibrillation: Secondary | ICD-10-CM

## 2024-05-07 DIAGNOSIS — D6869 Other thrombophilia: Secondary | ICD-10-CM

## 2024-05-07 NOTE — Progress Notes (Signed)
 Primary Care Physician: Gerome Brunet, DO Primary Cardiologist: None Electrophysiologist: None     Referring Physician: Sebastian Toribio GAILS, MD     Marsha LITTIE Savers is a 89 y.o. male with a history of hyponatremia, aortic arch calcifications by CXR, HTN, gout, CKD, anemia of renal disease, and atrial fibrillation who presents for consultation in the [facility]. Hospital admission 7/26-31/2025 for weakness and dizziness found to have symptomatic hyponatremia and new Afib with RVR placed started on Eliquis  2.5 mg BID. ASA stopped. Patient is on Eliquis  2.5 mg BID for a CHADS2VASC score of 4.  On follow-up 05/07/2024, patient is currently in NSR. He denies any recent chest pain or palpitations. No bleeding issues on Eliquis .  Today, he denies symptoms of shortness of breath, orthopnea, PND, lower extremity edema, dizziness, presyncope, syncope, snoring, daytime somnolence, bleeding, or neurologic sequela. The patient is tolerating medications without difficulties and is otherwise without complaint today.    he has a BMI of Body mass index is 25.76 kg/m.SABRA Filed Weights   05/07/24 1015  Weight: 72.4 kg     Current Outpatient Medications  Medication Sig Dispense Refill   acetaminophen  (TYLENOL ) 500 MG tablet Take 500-1,000 mg by mouth 2 (two) times daily as needed for mild pain (pain score 1-3) (or headaches). (Patient taking differently: Take 500-1,000 mg by mouth as needed for mild pain (pain score 1-3) (or headaches).)     allopurinol  (ZYLOPRIM ) 100 MG tablet Take 100 mg by mouth every morning.     amLODipine  (NORVASC ) 10 MG tablet Take 1 tablet (10 mg total) by mouth in the morning. 30 tablet 0   apixaban  (ELIQUIS ) 2.5 MG TABS tablet Take 1 tablet (2.5 mg total) by mouth 2 (two) times daily. 84 tablet    ascorbic acid (VITAMIN C) 500 MG tablet Take 500 mg by mouth daily.     atorvastatin  (LIPITOR) 20 MG tablet Take 20 mg by mouth every morning.  2    carvedilol  (COREG ) 6.25 MG tablet Take 6.25 mg by mouth 2 (two) times daily with a meal.     dorzolamide  (TRUSOPT ) 2 % ophthalmic solution Place 1 drop into both eyes 2 (two) times daily.     feeding supplement (ENSURE PLUS HIGH PROTEIN) LIQD Take 237 mLs by mouth 2 (two) times daily between meals.     Ferrous Sulfate  Dried (HIGH POTENCY IRON ) 65 MG TABS Take 130 mg by mouth daily after breakfast.     latanoprost  (XALATAN ) 0.005 % ophthalmic solution Place 1 drop into both eyes at bedtime.     mirtazapine (REMERON) 15 MG tablet 1 tablet at bedtime Orally Once a day for sleep and appetite; Duration: 90 days     Misc Natural Products (BEET ROOT) 500 MG CAPS Take 1,000 mg by mouth daily after breakfast.     pantoprazole  (PROTONIX ) 40 MG tablet Take 1 tablet (40 mg total) by mouth daily at 6 (six) AM. 30 tablet 0   torsemide  (DEMADEX ) 100 MG tablet Take 1 tablet (100 mg total) by mouth daily. 30 tablet 1   No current facility-administered medications for this encounter.    Atrial Fibrillation Management history:  Previous antiarrhythmic drugs: none Previous cardioversions: none Previous ablations: none Anticoagulation history: Eliquis    ROS- All systems are reviewed and negative except as per the HPI above.  Physical Exam: BP (!) 158/56   Pulse 66   Ht 5' 6 (1.676 m)   Wt 72.4 kg   BMI 25.76 kg/m  GEN- The patient is well appearing, alert and oriented x 3 today.   Neck - no JVD or carotid bruit noted Lungs- Clear to ausculation bilaterally, normal work of breathing Heart- Regular rate and rhythm, no murmurs, rubs or gallops, PMI not laterally displaced Extremities- no clubbing, cyanosis, or edema Skin - no rash or ecchymosis noted   EKG today demonstrates  EKG Interpretation Date/Time:  Monday May 07 2024 10:17:42 EST Ventricular Rate:  66 PR Interval:  306 QRS Duration:  82 QT Interval:  394 QTC Calculation: 413 R Axis:   35  Text Interpretation: Sinus rhythm with  sinus arrhythmia with 1st degree A-V block When compared with ECG of 27-Jan-2024 10:23, No significant change was found Confirmed by Terra Pac ([id number]) on 05/07/2024 10:28:23 AM     Echo 01/01/24 demonstrated   1. Left ventricular ejection fraction, by estimation, is 60 to 65%. The  left ventricle has normal function. The left ventricle has no regional  wall motion abnormalities. Left ventricular diastolic parameters are  consistent with Grade II diastolic  dysfunction (pseudonormalization).   2. Right ventricular systolic function is normal. The right ventricular  size is normal. Mildly increased right ventricular wall thickness. There  is moderately elevated pulmonary artery systolic pressure.   3. Left atrial size was mildly dilated.   4. The mitral valve is grossly normal. No evidence of mitral valve  regurgitation. No evidence of mitral stenosis.   5. Tricuspid valve regurgitation is moderate.   6. The aortic valve is tricuspid. Aortic valve regurgitation is not  visualized. Aortic valve sclerosis is present, with no evidence of aortic  valve stenosis.   7. The inferior vena cava is normal in size with greater than 50%  respiratory variability, suggesting right atrial pressure of 3 mmHg.    ASSESSMENT & PLAN CHA2DS2-VASc Score = 4  The patient's score is based upon: CHF History: 0 HTN History: 1 Diabetes History: 0 Stroke History: 0 Vascular Disease History: 1 Age Score: 2 Gender Score: 0       ASSESSMENT AND PLAN: Paroxysmal Atrial Fibrillation (ICD10:  I48.0) The patient's CHA2DS2-VASc score is 4, indicating a 4.8% annual risk of stroke.    Patient is currently in NSR.  Continue Coreg  6.25 mg twice daily.  Secondary Hypercoagulable State (ICD10:  D68.69) The patient is at significant risk for stroke/thromboembolism based upon his CHA2DS2-VASc Score of 4.  Continue Apixaban  (Eliquis ).  Continue Eliquis  2.5 mg twice daily. Dosage is correct based on age and  creatinine greater than 1.5.  Hypertension Elevated today, recommended to trend BP at home.    Follow up 1 year Afib clinic.   Terra Pac, PA-C  Afib Clinic [facility] [address] [place], [place] [address] [phone]

## 2024-05-18 ENCOUNTER — Other Ambulatory Visit (HOSPITAL_COMMUNITY): Payer: Self-pay

## 2024-05-18 MED ORDER — APIXABAN 2.5 MG PO TABS: 2.5000 mg | ORAL_TABLET | Freq: Two times a day (BID) | ORAL | Status: DC

## 2024-05-18 MED ORDER — APIXABAN 2.5 MG PO TABS: 2.5000 mg | ORAL_TABLET | Freq: Two times a day (BID) | ORAL | Status: AC

## 2024-05-29 ENCOUNTER — Ambulatory Visit (HOSPITAL_COMMUNITY)
Admission: RE | Admit: 2024-05-29 | Discharge: 2024-05-29 | Disposition: A | Discharge status: Home / Self Care | Source: Ambulatory Visit | Attending: Nephrology | Admitting: Nephrology

## 2024-05-29 VITALS — BP 174/54 | HR 64 | Temp 97.8°F | Resp 15

## 2024-05-29 DIAGNOSIS — N184 Chronic kidney disease, stage 4 (severe): Secondary | ICD-10-CM | POA: Insufficient documentation

## 2024-05-29 DIAGNOSIS — D631 Anemia in chronic kidney disease: Secondary | ICD-10-CM | POA: Diagnosis present

## 2024-05-29 LAB — IRON AND TIBC
Iron: 50 ug/dL (ref 45–182)
Saturation Ratios: 24 % (ref 17.9–39.5)
TIBC: 211 ug/dL — ABNORMAL LOW (ref 250–450)
UIBC: 161 ug/dL

## 2024-05-29 LAB — POCT HEMOGLOBIN-HEMACUE: Hemoglobin: 8.7 g/dL — ABNORMAL LOW (ref 13.0–17.0)

## 2024-05-29 LAB — FERRITIN: Ferritin: 1318 ng/mL — ABNORMAL HIGH (ref 24–336)

## 2024-05-29 MED ORDER — EPOETIN ALFA-EPBX 40000 UNIT/ML IJ SOLN
40000.0000 [IU] | Freq: Once | INTRAMUSCULAR | Status: AC
  Administered 2024-05-29: 40000 [IU] via SUBCUTANEOUS

## 2024-05-29 MED ORDER — EPOETIN ALFA-EPBX 40000 UNIT/ML IJ SOLN
INTRAMUSCULAR | Status: AC
  Filled 2024-05-29: qty 1

## 2024-06-15 ENCOUNTER — Telehealth (HOSPITAL_COMMUNITY): Payer: Self-pay

## 2024-06-15 NOTE — Telephone Encounter (Addendum)
 Auth Submission: NO AUTH NEEDED Site of care: Site of care: [facility] Payer: [employer] Medicare Medication & CPT/J Code(s) submitted: Retacrit  ([id number]) Diagnosis Code: N18.4/D63.1 Route of submission (phone, fax, portal):  Phone # Fax # Auth type: Buy/Bill HB Units/visits requested: 40000 units q4weeks Reference number: [id number] Approval from: 06/07/24 to 06/06/25

## 2024-06-21 ENCOUNTER — Other Ambulatory Visit (HOSPITAL_COMMUNITY): Payer: Self-pay | Admitting: Internal Medicine

## 2024-06-21 NOTE — Progress Notes (Signed)
 Received Retacrit  orders. No dose or frequency changes. Provider change only  Dose: 40000 units every 4 weeks  Labs: POCT Hgb at every visit. iPTH every 4 months. Renal panel, iron  panel, and ferritin every 2 months  Sherry Pennant, PharmD, MPH, BCPS, CPP Clinical Pharmacist

## 2024-06-26 ENCOUNTER — Ambulatory Visit (HOSPITAL_COMMUNITY)
Admission: RE | Admit: 2024-06-26 | Discharge: 2024-06-26 | Disposition: A | Discharge status: Home / Self Care | Source: Ambulatory Visit | Attending: Nephrology | Admitting: Nephrology

## 2024-06-26 VITALS — BP 152/62 | HR 74 | Temp 97.6°F | Resp 15

## 2024-06-26 DIAGNOSIS — N184 Chronic kidney disease, stage 4 (severe): Secondary | ICD-10-CM | POA: Diagnosis present

## 2024-06-26 DIAGNOSIS — D631 Anemia in chronic kidney disease: Secondary | ICD-10-CM | POA: Insufficient documentation

## 2024-06-26 LAB — RENAL FUNCTION PANEL
Albumin: 4.2 g/dL (ref 3.5–5.0)
Anion gap: 15 (ref 5–15)
BUN: 77 mg/dL — ABNORMAL HIGH (ref 8–23)
CO2: 21 mmol/L — ABNORMAL LOW (ref 22–32)
Calcium: 10.4 mg/dL — ABNORMAL HIGH (ref 8.9–10.3)
Chloride: 100 mmol/L (ref 98–111)
Creatinine, Ser: 6.54 mg/dL — ABNORMAL HIGH (ref 0.61–1.24)
GFR, Estimated: 7 mL/min — ABNORMAL LOW
Glucose, Bld: 103 mg/dL — ABNORMAL HIGH (ref 70–99)
Phosphorus: 5.9 mg/dL — ABNORMAL HIGH (ref 2.5–4.6)
Potassium: 4.6 mmol/L (ref 3.5–5.1)
Sodium: 137 mmol/L (ref 135–145)

## 2024-06-26 LAB — IRON AND TIBC
Iron: 46 ug/dL (ref 45–182)
Saturation Ratios: 22 % (ref 17.9–39.5)
TIBC: 207 ug/dL — ABNORMAL LOW (ref 250–450)
UIBC: 161 ug/dL

## 2024-06-26 LAB — FERRITIN: Ferritin: 995 ng/mL — ABNORMAL HIGH (ref 24–336)

## 2024-06-26 LAB — POCT HEMOGLOBIN-HEMACUE: Hemoglobin: 8.1 g/dL — ABNORMAL LOW (ref 13.0–17.0)

## 2024-06-26 MED ORDER — CLONIDINE HCL 0.1 MG PO TABS: 0.1000 mg | ORAL_TABLET | ORAL | Status: DC | PRN

## 2024-06-26 MED ORDER — EPOETIN ALFA-EPBX 40000 UNIT/ML IJ SOLN
40000.0000 [IU] | Freq: Once | INTRAMUSCULAR | Status: AC
  Administered 2024-06-26: 40000 [IU] via SUBCUTANEOUS

## 2024-06-26 MED ORDER — EPOETIN ALFA-EPBX 40000 UNIT/ML IJ SOLN
INTRAMUSCULAR | Status: AC
  Filled 2024-06-26: qty 1

## 2024-06-27 LAB — PTH, INTACT AND CALCIUM
Calcium, Total (PTH): 10 mg/dL (ref 8.6–10.2)
PTH: 645 pg/mL — ABNORMAL HIGH (ref 15–65)

## 2024-06-28 ENCOUNTER — Other Ambulatory Visit: Payer: Self-pay | Admitting: Internal Medicine

## 2024-06-28 DIAGNOSIS — N179 Acute kidney failure, unspecified: Secondary | ICD-10-CM

## 2024-06-29 ENCOUNTER — Encounter (HOSPITAL_COMMUNITY): Payer: Self-pay | Admitting: Internal Medicine

## 2024-06-29 ENCOUNTER — Other Ambulatory Visit (HOSPITAL_COMMUNITY): Payer: Self-pay | Admitting: Internal Medicine

## 2024-06-29 DIAGNOSIS — N185 Chronic kidney disease, stage 5: Secondary | ICD-10-CM | POA: Insufficient documentation

## 2024-06-29 NOTE — Progress Notes (Signed)
 Received updated Retacrit  orders  Dose: 30000 units SQ every 2 weeks  Labs: POCT Hgb every visit; renal panel, ferritin, nad iron  panel every 2 months, iPTH every 4 months  Last dose: 06/26/2024 Next dose should be 07/10/2024. Message sent to scheduling/nursing team  Sherry Pennant, PharmD, MPH, BCPS, CPP Clinical Pharmacist

## 2024-07-06 ENCOUNTER — Emergency Department (HOSPITAL_COMMUNITY)

## 2024-07-06 ENCOUNTER — Other Ambulatory Visit: Payer: Self-pay

## 2024-07-06 ENCOUNTER — Other Ambulatory Visit

## 2024-07-06 ENCOUNTER — Encounter (HOSPITAL_COMMUNITY): Payer: Self-pay

## 2024-07-06 ENCOUNTER — Observation Stay (HOSPITAL_COMMUNITY)
Admission: EM | Admit: 2024-07-06 | Discharge: 2024-07-08 | Disposition: A | Source: Ambulatory Visit | Attending: Internal Medicine | Admitting: Internal Medicine

## 2024-07-06 DIAGNOSIS — I480 Paroxysmal atrial fibrillation: Secondary | ICD-10-CM

## 2024-07-06 DIAGNOSIS — Z87891 Personal history of nicotine dependence: Secondary | ICD-10-CM | POA: Diagnosis not present

## 2024-07-06 DIAGNOSIS — R7989 Other specified abnormal findings of blood chemistry: Secondary | ICD-10-CM | POA: Diagnosis present

## 2024-07-06 DIAGNOSIS — N179 Acute kidney failure, unspecified: Secondary | ICD-10-CM | POA: Diagnosis not present

## 2024-07-06 DIAGNOSIS — Z96641 Presence of right artificial hip joint: Secondary | ICD-10-CM | POA: Diagnosis not present

## 2024-07-06 DIAGNOSIS — E785 Hyperlipidemia, unspecified: Secondary | ICD-10-CM | POA: Diagnosis present

## 2024-07-06 DIAGNOSIS — I120 Hypertensive chronic kidney disease with stage 5 chronic kidney disease or end stage renal disease: Secondary | ICD-10-CM | POA: Diagnosis not present

## 2024-07-06 DIAGNOSIS — D631 Anemia in chronic kidney disease: Secondary | ICD-10-CM | POA: Diagnosis present

## 2024-07-06 DIAGNOSIS — H409 Unspecified glaucoma: Secondary | ICD-10-CM | POA: Diagnosis not present

## 2024-07-06 DIAGNOSIS — M109 Gout, unspecified: Secondary | ICD-10-CM | POA: Diagnosis present

## 2024-07-06 DIAGNOSIS — Z7901 Long term (current) use of anticoagulants: Secondary | ICD-10-CM | POA: Diagnosis not present

## 2024-07-06 DIAGNOSIS — N185 Chronic kidney disease, stage 5: Secondary | ICD-10-CM | POA: Diagnosis not present

## 2024-07-06 DIAGNOSIS — Z79899 Other long term (current) drug therapy: Secondary | ICD-10-CM | POA: Diagnosis not present

## 2024-07-06 DIAGNOSIS — I10 Essential (primary) hypertension: Secondary | ICD-10-CM | POA: Diagnosis present

## 2024-07-06 DIAGNOSIS — Z89621 Acquired absence of right hip joint: Secondary | ICD-10-CM | POA: Diagnosis present

## 2024-07-06 HISTORY — DX: Essential (primary) hypertension: I10

## 2024-07-06 LAB — COMPREHENSIVE METABOLIC PANEL WITH GFR
ALT: 12 U/L (ref 0–44)
AST: 19 U/L (ref 15–41)
Albumin: 4.4 g/dL (ref 3.5–5.0)
Alkaline Phosphatase: 96 U/L (ref 38–126)
Anion gap: 19 — ABNORMAL HIGH (ref 5–15)
BUN: 90 mg/dL — ABNORMAL HIGH (ref 8–23)
CO2: 25 mmol/L (ref 22–32)
Calcium: 10.5 mg/dL — ABNORMAL HIGH (ref 8.9–10.3)
Chloride: 94 mmol/L — ABNORMAL LOW (ref 98–111)
Creatinine, Ser: 8.46 mg/dL — ABNORMAL HIGH (ref 0.61–1.24)
GFR, Estimated: 5 mL/min — ABNORMAL LOW
Glucose, Bld: 100 mg/dL — ABNORMAL HIGH (ref 70–99)
Potassium: 4.2 mmol/L (ref 3.5–5.1)
Sodium: 137 mmol/L (ref 135–145)
Total Bilirubin: 0.5 mg/dL (ref 0.0–1.2)
Total Protein: 8.3 g/dL — ABNORMAL HIGH (ref 6.5–8.1)

## 2024-07-06 LAB — URINALYSIS, ROUTINE W REFLEX MICROSCOPIC
Bilirubin Urine: NEGATIVE
Glucose, UA: NEGATIVE mg/dL
Hgb urine dipstick: NEGATIVE
Ketones, ur: NEGATIVE mg/dL
Nitrite: NEGATIVE
Protein, ur: >=300 mg/dL — AB
Specific Gravity, Urine: 1.008 (ref 1.005–1.030)
pH: 7 (ref 5.0–8.0)

## 2024-07-06 LAB — CBC WITH DIFFERENTIAL/PLATELET
Abs Immature Granulocytes: 0.03 10*3/uL (ref 0.00–0.07)
Basophils Absolute: 0.1 10*3/uL (ref 0.0–0.1)
Basophils Relative: 1 %
Eosinophils Absolute: 0.2 10*3/uL (ref 0.0–0.5)
Eosinophils Relative: 3 %
HCT: 29.2 % — ABNORMAL LOW (ref 39.0–52.0)
Hemoglobin: 9.2 g/dL — ABNORMAL LOW (ref 13.0–17.0)
Immature Granulocytes: 0 %
Lymphocytes Relative: 13 %
Lymphs Abs: 0.9 10*3/uL (ref 0.7–4.0)
MCH: 27.5 pg (ref 26.0–34.0)
MCHC: 31.5 g/dL (ref 30.0–36.0)
MCV: 87.2 fL (ref 80.0–100.0)
Monocytes Absolute: 0.6 10*3/uL (ref 0.1–1.0)
Monocytes Relative: 9 %
Neutro Abs: 5.2 10*3/uL (ref 1.7–7.7)
Neutrophils Relative %: 74 %
Platelets: 281 10*3/uL (ref 150–400)
RBC: 3.35 MIL/uL — ABNORMAL LOW (ref 4.22–5.81)
RDW: 18.7 % — ABNORMAL HIGH (ref 11.5–15.5)
WBC: 7.1 10*3/uL (ref 4.0–10.5)
nRBC: 0 % (ref 0.0–0.2)

## 2024-07-06 LAB — PHOSPHORUS: Phosphorus: 8.7 mg/dL — ABNORMAL HIGH (ref 2.5–4.6)

## 2024-07-06 LAB — CREATININE, URINE, RANDOM: Creatinine, Urine: 46 mg/dL

## 2024-07-06 LAB — SODIUM, URINE, RANDOM: Sodium, Ur: 89 mmol/L

## 2024-07-06 LAB — MAGNESIUM: Magnesium: 3.2 mg/dL — ABNORMAL HIGH (ref 1.7–2.4)

## 2024-07-06 MED ORDER — AMLODIPINE BESYLATE 10 MG PO TABS
10.0000 mg | ORAL_TABLET | Freq: Every morning | ORAL | Status: DC
  Administered 2024-07-07 – 2024-07-08 (×2): 10 mg via ORAL
  Filled 2024-07-06 (×2): qty 1

## 2024-07-06 MED ORDER — APIXABAN 2.5 MG PO TABS
2.5000 mg | ORAL_TABLET | Freq: Two times a day (BID) | ORAL | Status: DC
  Administered 2024-07-06 – 2024-07-08 (×4): 2.5 mg via ORAL
  Filled 2024-07-06 (×4): qty 1

## 2024-07-06 MED ORDER — ACETAMINOPHEN 650 MG RE SUPP: 650.0000 mg | Freq: Four times a day (QID) | RECTAL | Status: DC | PRN

## 2024-07-06 MED ORDER — ENSURE PLUS HIGH PROTEIN PO LIQD
237.0000 mL | Freq: Every evening | ORAL | Status: DC
  Administered 2024-07-07: 237 mL via ORAL

## 2024-07-06 MED ORDER — FERROUS SULFATE 325 (65 FE) MG PO TABS
325.0000 mg | ORAL_TABLET | Freq: Every day | ORAL | Status: DC
  Administered 2024-07-07 – 2024-07-08 (×2): 325 mg via ORAL
  Filled 2024-07-06 (×3): qty 1

## 2024-07-06 MED ORDER — DORZOLAMIDE HCL 2 % OP SOLN
1.0000 [drp] | Freq: Two times a day (BID) | OPHTHALMIC | Status: DC
  Administered 2024-07-06 – 2024-07-08 (×4): 1 [drp] via OPHTHALMIC
  Filled 2024-07-06: qty 10

## 2024-07-06 MED ORDER — HIGH POTENCY IRON 65 MG PO TABS: 130.0000 mg | ORAL_TABLET | Freq: Every day | ORAL | Status: DC

## 2024-07-06 MED ORDER — ACETAMINOPHEN 325 MG PO TABS: 650.0000 mg | ORAL_TABLET | Freq: Four times a day (QID) | ORAL | Status: DC | PRN

## 2024-07-06 MED ORDER — LATANOPROST 0.005 % OP SOLN
1.0000 [drp] | Freq: Every day | OPHTHALMIC | Status: DC
  Administered 2024-07-06 – 2024-07-07 (×2): 1 [drp] via OPHTHALMIC
  Filled 2024-07-06: qty 2.5

## 2024-07-06 MED ORDER — HYDRALAZINE HCL 20 MG/ML IJ SOLN: 5.0000 mg | Freq: Three times a day (TID) | INTRAMUSCULAR | Status: DC | PRN

## 2024-07-06 MED ORDER — ATORVASTATIN CALCIUM 10 MG PO TABS
20.0000 mg | ORAL_TABLET | Freq: Every morning | ORAL | Status: DC
  Administered 2024-07-07 – 2024-07-08 (×2): 20 mg via ORAL
  Filled 2024-07-06 (×2): qty 2

## 2024-07-06 MED ORDER — ALLOPURINOL 100 MG PO TABS
100.0000 mg | ORAL_TABLET | Freq: Every morning | ORAL | Status: DC
  Administered 2024-07-07 – 2024-07-08 (×2): 100 mg via ORAL
  Filled 2024-07-06 (×2): qty 1

## 2024-07-06 MED ORDER — CARVEDILOL 6.25 MG PO TABS
6.2500 mg | ORAL_TABLET | Freq: Two times a day (BID) | ORAL | Status: DC
  Administered 2024-07-06 – 2024-07-08 (×4): 6.25 mg via ORAL
  Filled 2024-07-06 (×4): qty 1

## 2024-07-06 MED ORDER — MIRTAZAPINE 15 MG PO TABS
15.0000 mg | ORAL_TABLET | Freq: Every day | ORAL | Status: DC
  Administered 2024-07-06 – 2024-07-07 (×2): 15 mg via ORAL
  Filled 2024-07-06 (×2): qty 1

## 2024-07-06 MED ORDER — HYDRALAZINE HCL 20 MG/ML IJ SOLN: 2.0000 mg | Freq: Three times a day (TID) | INTRAMUSCULAR | Status: DC | PRN

## 2024-07-06 NOTE — H&P (Addendum)
 " History and Physical    Patient: Danny Rhodes FMW:[id number] DOB: [birth year] DOA: 07/06/2024 DOS: the patient was seen and examined on 07/06/2024 PCP: Gerome Brunet, DO  Patient coming from: Home - lives with his daughter. Uses walker to ambulate.    Chief Complaint: abnormal labs   HPI: Danny Rhodes is a 89 y.o. male with medical history significant of CKD stage V, ACD, HTN, gout, HLD, HTN retinopathy and glaucoma who presented to ED due to abnormal lab with his nephrologist. His nephrologist advised him to come to ED. He feels fine, specifically denies any increased LE swelling, abdominal swelling, change in urination, confusion, N/V. Does have some skin itching.    He has been feeling good. Denies any fever/chills, vision changes/headaches, chest pain or palpitations, shortness of breath or cough, abdominal pain, N/V/D, dysuria or leg swelling.    He does not smoke or drink alcohol.   ER Course:  vitals: afebrile, bp: 160/47, HR: 78, RR: 18, oxygen: 100%RA Pertinent labs: hgb: 9.2, BUN: 90, creatinine: 8.46, calcium : 10.5, GFR 5, AG: 19 Renal US : Complex mixed echogenicity lesion/mass measuring up to 10.2 cm lateral to the right kidney, with additional component versus separate mass measuring up to 6.6 cm in the right lower quadrant. Upon comparison a prior CT performed the same day. These lesions appear to be associated with a destructive process about the right hip. Please refer to that exam for further evaluation of these findings. 2. Additional bilateral renal cysts measuring up to 5.5 cm on the right and 2.3 cm on the left, similar as compared to previous ultrasound from 02/02/2017, consistent with benign lesions. 3. Increased echogenicity within the renal parenchyma, compatible with medical renal disease. 4. No hydronephrosis. CT abdomen/pelvis: Complex right renal cyst containing multiple thin calcified internal septations, which is been previously evaluated  by ultrasound and CT exams. Numerous other simple and hyperdense bilateral renal cysts are identified as well. In light of patient's age and comorbidities, no specific imaging follow-up is recommended. 2. 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 The soft tissue masses extending superiorly from the joint space likely reflect the ultrasound findings lateral to the right kidney. No follow-up is recommended. 3. Stable large right inguinal and scrotal hernia containing multiple loops of large and small bowel. No incarceration or obstruction. 4. Prostatomegaly with evidence of chronic bladder outlet obstruction. In ED: nephology consulted. TRH asked to admit.    Review of Systems: As mentioned in the history of present illness. All other systems reviewed and are negative. Past Medical History:  Diagnosis Date   CKD (chronic kidney disease), symptom management only, stage 4 (severe) (HCC)    Glaucoma    POAG OU   History of anemia due to chronic kidney disease    Hypertension    Hypertensive retinopathy    OU   Past Surgical History:  Procedure Laterality Date   CATARACT EXTRACTION Bilateral    EYE SURGERY Bilateral    Cat Sx   Hip replacement Right    20 yrs ago   TOTAL HIP ARTHROPLASTY Right 12/01/2021   Procedure: EXCISIONAL TOTAL HIP ARTHROPLASTY WITH POSTERIOR APPROACH;  Surgeon: Ernie Cough, MD;  Location: [facility];  Service: Orthopedics;  Laterality: Right;   Social History:  reports that he has quit smoking. His smoking use included cigars. He has never used smokeless tobacco. He reports that he does not currently use alcohol. He reports that he does not use drugs.  Allergies[1]  History reviewed. No pertinent family history.  Prior to Admission medications  Medication Sig Start Date End Date Taking? Authorizing Provider  acetaminophen  (TYLENOL ) 500 MG tablet Take 500-1,000 mg by mouth 2 (two) times daily as needed  for mild pain (pain score 1-3) (or headaches). Patient taking differently: Take 500-1,000 mg by mouth as needed for mild pain (pain score 1-3) (or headaches).    [provider]  allopurinol  (ZYLOPRIM ) 100 MG tablet Take 100 mg by mouth every morning.    [provider]  amLODipine  (NORVASC ) 10 MG tablet Take 1 tablet (10 mg total) by mouth in the morning. 01/27/24   Terra Fairy PARAS, PA-C  apixaban  (ELIQUIS ) 2.5 MG TABS tablet Take 1 tablet (2.5 mg total) by mouth 2 (two) times daily. 05/07/24   Terra Fairy PARAS, PA-C  ascorbic acid (VITAMIN C) 500 MG tablet Take 500 mg by mouth daily.    [provider]  atorvastatin  (LIPITOR) 20 MG tablet Take 20 mg by mouth every morning. 09/26/17   [provider]  carvedilol  (COREG ) 6.25 MG tablet Take 6.25 mg by mouth 2 (two) times daily with a meal.    [provider]  dorzolamide  (TRUSOPT ) 2 % ophthalmic solution Place 1 drop into both eyes 2 (two) times daily.    [provider]  feeding supplement (ENSURE PLUS HIGH PROTEIN) LIQD Take 237 mLs by mouth 2 (two) times daily between meals. 01/05/24   Sebastian Toribio GAILS, MD  Ferrous Sulfate  Dried (HIGH POTENCY IRON ) 65 MG TABS Take 130 mg by mouth daily after breakfast.    [provider]  latanoprost  (XALATAN ) 0.005 % ophthalmic solution Place 1 drop into both eyes at bedtime.    [provider]  mirtazapine  (REMERON ) 15 MG tablet 1 tablet at bedtime Orally Once a day for sleep and appetite; Duration: 90 days    [provider]  Misc Natural Products (BEET ROOT) 500 MG CAPS Take 1,000 mg by mouth daily after breakfast.    [provider]  pantoprazole  (PROTONIX ) 40 MG tablet Take 1 tablet (40 mg total) by mouth daily at 6 (six) AM. 01/05/24   Sebastian Toribio GAILS, MD  torsemide  (DEMADEX ) 100 MG tablet Take 1 tablet (100 mg total) by mouth daily. 01/06/24   Sebastian Toribio GAILS, MD    Physical Exam: Vitals:   07/06/24 1351  07/06/24 1854  BP: (!) 160/47 (!) 168/62  Pulse: 78 82  Resp: 18 18  Temp: 98.1 F (36.7 C) 98.6 F (37 C)  TempSrc: Oral Oral  SpO2: 100% 100%  Weight: 72.6 kg   Height: 5' 6 (1.676 m)    General:  Appears calm and comfortable and is in NAD Eyes:  PERRL, EOMI, normal lids, iris ENT:  HOH, lips & tongue, mmm; appropriate dentition Neck:  no LAD, masses or thyromegaly; no carotid bruits Cardiovascular:  regularly irregular, no m/r/g. 1+ LE edema.  Respiratory:   CTA bilaterally with no wheezes/rales/rhonchi.  Normal respiratory effort. Abdomen:  soft, NT, ND, NABS Back:   normal alignment, no CVAT Skin:  no rash or induration seen on limited exam Musculoskeletal:  grossly normal tone BUE/BLE, good ROM, no bony abnormality Lower extremity:   Limited foot exam with no ulcerations.  2+ distal pulses. Psychiatric:  grossly normal mood and affect, speech fluent and appropriate, AOx3 Neurologic:  CN 2-12 grossly intact, moves all extremities in coordinated fashion, sensation intact   Radiological Exams on Admission: Independently reviewed - see discussion in  A/P where applicable  US  Renal Result Date: 07/06/2024 CLINICAL DATA:  Initial evaluation for exophytic lesion on prior ultrasound. EXAM: RENAL / URINARY TRACT ULTRASOUND COMPLETE COMPARISON:  Comparison made with dedicated CT of the abdomen and pelvis performed on the same day as well as previous ultrasound from 02/02/2017. FINDINGS: Right Kidney: Renal measurements: 10.0 x 6.0 x 7.4 cm = volume: 228.2 mL. Increased echogenicity within the renal parenchyma. No visible nephrolithiasis or hydronephrosis. 5.0 x 5.5 x 5.2 cm complex cystic lesion present within the interpolar right kidney. There is an additional complex mixed echogenicity lesion/mass measuring approximately 10.2 x 7.6 x 7.2 cm lateral to the right kidney. In additional heterogeneous hypoechoic component seen inferiorly within the right lower quadrant measuring 6.6 x 5.7 x  6.2 cm. Left Kidney: Renal measurements: 9.6 x 4.5 x 5.3 cm = volume: 119.2 mL. Increased echogenicity within the renal parenchyma. No nephrolithiasis or hydronephrosis. 2.2 x 2.2 x 2.3 cm hypoechoic cyst at the lower pole. Bladder: Appears normal for degree of bladder distention. Other: None. IMPRESSION: 1. Complex mixed echogenicity lesion/mass measuring up to 10.2 cm lateral to the right kidney, with additional component versus separate mass measuring up to 6.6 cm in the right lower quadrant. Upon comparison a prior CT performed the same day. These lesions appear to be associated with a destructive process about the right hip. Please refer to that exam for further evaluation of these findings. 2. Additional bilateral renal cysts measuring up to 5.5 cm on the right and 2.3 cm on the left, similar as compared to previous ultrasound from 02/02/2017, consistent with benign lesions. 3. Increased echogenicity within the renal parenchyma, compatible with medical renal disease. 4. No hydronephrosis. Electronically Signed   By: Morene Hoard M.D.   On: 07/06/2024 19:39   CT ABDOMEN PELVIS WO CONTRAST Result Date: 07/06/2024 CLINICAL DATA:  Right renal mass on ultrasound EXAM: CT ABDOMEN AND PELVIS WITHOUT CONTRAST TECHNIQUE: Multidetector CT imaging of the abdomen and pelvis was performed following the standard protocol without IV contrast. Unenhanced CT was performed per clinician order. Lack of IV contrast limits sensitivity and specificity, especially for evaluation of abdominal/pelvic solid viscera. RADIATION DOSE REDUCTION: This exam was performed according to the departmental dose-optimization program which includes automated exposure control, adjustment of the mA and/or kV according to patient size and/or use of iterative reconstruction technique. COMPARISON:  08/20/2008, 07/06/2024, 11/27/2021 FINDINGS: Lower chest: No acute pleural or parenchymal lung disease. Hepatobiliary: Scattered hepatic cysts.  Calcified gallstones without evidence of acute cholecystitis. Pancreas: Unremarkable unenhanced appearance. Spleen: Unremarkable unenhanced appearance. Adrenals/Urinary Tract: There is a complex cyst within the mid to upper right kidney, measuring 6.3 x 5.2 x 5.5 cm reference image 28/2, with multiple internal calcified septations identified. 1.7 cm hyperdense cyst is seen within the right kidney reference image 24/2. There is a lobular peripelvic cyst lower pole left kidney measuring 1.9 x 3.1 x 2.1 cm, reference image 32/2. Subcentimeter simple and hyperdense cysts are seen within the upper pole left kidney, largest measuring 0.8 cm. No evidence of urinary tract calculi or obstructive uropathy within either kidney. The adrenals are grossly normal. The bladder is decompressed, with trabeculated thickened bladder wall most consistent with chronic bladder outlet obstruction. Stomach/Bowel: No bowel obstruction or ileus. Large and small bowel are contained within a large right inguinal hernia extending into the right hemiscrotum, similar to prior exams. No evidence of incarceration or obstruction. Vascular/Lymphatic: Aortic atherosclerosis. No enlarged abdominal or pelvic lymph nodes. Reproductive: Moderate prostatomegaly. Other: There  is no free fluid or free intraperitoneal gas. A large right inguinal hernia extending into the right hemiscrotum is again noted, containing multiple loops of large and small bowel. No incarceration or obstruction. Musculoskeletal: Destructive changes are again seen centered at the right hip, related to previous right hip arthroplasty and sequela of particle disease. Multiple ovoid masses extending superiorly from the right hip joint space are compatible with sequela of particle disease, similar to prior CT exam. The most superior of these which measures 9.6 x 8.1 x 6.2 cm likely corresponds to the areas adjacent to the right kidney identified on recent ultrasound. There are no acute  displaced fractures. Reconstructed images demonstrate no additional findings. IMPRESSION: 1. Complex right renal cyst containing multiple thin calcified internal septations, which is been previously evaluated by ultrasound and CT exams. Numerous other simple and hyperdense bilateral renal cysts are identified as well. In light of patient's age and comorbidities, no specific imaging follow-up is recommended. 2. 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 The soft tissue masses extending superiorly from the joint space likely reflect the ultrasound findings lateral to the right kidney. No follow-up is recommended. 3. Stable large right inguinal and scrotal hernia containing multiple loops of large and small bowel. No incarceration or obstruction. 4. Prostatomegaly with evidence of chronic bladder outlet obstruction. Electronically Signed   By: Ozell Daring M.D.   On: 07/06/2024 19:29    EKG: rate controlled afib. EKG pending    Labs on Admission: I have personally reviewed the available labs and imaging studies at the time of the admission.  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 (Was 6.54 about 10 days ago)  -he is declining dialysis. Discussed this will lead to death if renal function continues to progress. He understands, still declines. Discussed hospice. Palliative care order placed  -nephrology consulted in ED, recommended consulting tomorrow or if he decides on dialysis  -will hold torsemide  for now, already had his dose today  -check UA and urine studies -strict I/O  -renal US  with no obstructive findings. 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     Advance Care Planning:   Code Status: Limited: Do not attempt resuscitation (DNR) -DNR-LIMITED -Do Not Intubate/DNI     Consultants: -nephrology in ED, but advised to consult tomorrow  -palliative care    Procedures: US  renal and CT abdomen/pelvis 1/30    Antibiotics: None     DVT Prophylaxis: eliquis    Family Communication: none   Severity of Illness: The appropriate patient status  for this patient is OBSERVATION. Observation status is judged to be reasonable and necessary in order to provide the required intensity of service to ensure the patient's safety. The patient's presenting symptoms, physical exam findings, and initial radiographic and laboratory data in the context of their medical condition is felt to place them at decreased risk for further clinical deterioration. Furthermore, it is anticipated that the patient will be medically stable for discharge from the hospital within 2 midnights of admission.   Author: Isaiah Geralds, MD 07/06/2024 10:19 PM  For on call review [email or web address].      [1] No Known Allergies  "

## 2024-07-06 NOTE — Assessment & Plan Note (Deleted)
 89 year old presenting to ED due to abnormal labs showing worsening renal function in setting of stage V CKD advised to come to ED by his nephrologist  -obs to tele  -potassium and CO2 wnl  -he is asymptomatic  -his renal function has significantly progressed over last 6 months: 2.82>4.94>6.54>8.46 today. (Was 6.54 about 10 days ago)  -he is declining dialysis. Discussed this will lead to death if renal function continues to progress. He understands, still declines. Discussed hospice. Palliative care order placed  -nephrology consulted in ED, recommended consulting tomorrow or if he decides on dialysis  -will hold torsemide  for now, already had his dose today  -check UA and urine studies -strict I/O  -renal US  with no obstructive findings. 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

## 2024-07-06 NOTE — Assessment & Plan Note (Signed)
 CT with particle disease back in 2023 with similar findings Noted on f/u recommended on cT read today

## 2024-07-06 NOTE — Assessment & Plan Note (Signed)
 Hgb baseline of 8-9,s table Continue daily iron 

## 2024-07-06 NOTE — ED Provider Triage Note (Signed)
 Emergency Medicine Provider Triage Evaluation Note  Danny Rhodes , a 89 y.o. male  was evaluated in triage.  Patient presents for renal ultrasound.  Patient daughter states that they missed their imaging appointment this morning due to the ice.  Patient is in no acute distress and has no current symptoms.  Review of Systems  Positive:  Negative:   Physical Exam  BP (!) 160/47 (BP Location: Right Arm)   Pulse 78   Temp 98.1 F (36.7 C) (Oral)   Resp 18   Ht 5' 6 (1.676 m)   Wt 72.6 kg   SpO2 100%   BMI 25.82 kg/m  Gen:   Awake, no distress   Resp:  Normal effort  MSK:   Moves extremities without difficulty  Other:    Medical Decision Making  Medically screening exam initiated at 2:18 PM.  Appropriate orders placed.  QUILL GRINDER was informed that the remainder of the evaluation will be completed by another provider, this initial triage assessment does not replace that evaluation, and the importance of remaining in the ED until their evaluation is complete.     Willma Duwaine LITTIE, [place] 07/06/24 1419

## 2024-07-06 NOTE — ED Notes (Signed)
 5 E called prior to patient being transported to floor.  Patient given sandwich, crackers, and juice prior to transport

## 2024-07-06 NOTE — Assessment & Plan Note (Addendum)
 Continue norvasc  10mg  and coreg  6.25mg  BID  Hydralazine  PRN

## 2024-07-06 NOTE — Assessment & Plan Note (Signed)
 Continue allopurinol 

## 2024-07-06 NOTE — Assessment & Plan Note (Signed)
 Continue lipitor

## 2024-07-06 NOTE — Assessment & Plan Note (Signed)
 EKG pending, appears to be in NSR Continue eliquis  and coreg 

## 2024-07-06 NOTE — ED Triage Notes (Signed)
 Pt with a hx of CKD saw his PCP on 1/20 for routine labs. It was noted that he had worsening of his kidney function with his creatinine going from 4.94 on 05/01/24 to 6.54 on 06/26/24. He was advised to go to the ED for a scan of his kidneys. Pt denies any new or concerning symptoms. He is voiding regularly. He denies any CP or ShOB. He reports that he normally has swelling in his legs, but that has been improving.

## 2024-07-06 NOTE — Assessment & Plan Note (Signed)
 Continue xalatan  and trusopt .

## 2024-07-07 LAB — BASIC METABOLIC PANEL WITH GFR
Anion gap: 18 — ABNORMAL HIGH (ref 5–15)
BUN: 92 mg/dL — ABNORMAL HIGH (ref 8–23)
CO2: 23 mmol/L (ref 22–32)
Calcium: 9.8 mg/dL (ref 8.9–10.3)
Chloride: 96 mmol/L — ABNORMAL LOW (ref 98–111)
Creatinine, Ser: 8.84 mg/dL — ABNORMAL HIGH (ref 0.61–1.24)
GFR, Estimated: 5 mL/min — ABNORMAL LOW
Glucose, Bld: 93 mg/dL (ref 70–99)
Potassium: 4.3 mmol/L (ref 3.5–5.1)
Sodium: 137 mmol/L (ref 135–145)

## 2024-07-07 LAB — CBC
HCT: 24.6 % — ABNORMAL LOW (ref 39.0–52.0)
Hemoglobin: 7.9 g/dL — ABNORMAL LOW (ref 13.0–17.0)
MCH: 27.6 pg (ref 26.0–34.0)
MCHC: 32.1 g/dL (ref 30.0–36.0)
MCV: 86 fL (ref 80.0–100.0)
Platelets: 245 10*3/uL (ref 150–400)
RBC: 2.86 MIL/uL — ABNORMAL LOW (ref 4.22–5.81)
RDW: 18.6 % — ABNORMAL HIGH (ref 11.5–15.5)
WBC: 5.8 10*3/uL (ref 4.0–10.5)
nRBC: 0 % (ref 0.0–0.2)

## 2024-07-07 MED ORDER — TORSEMIDE 20 MG PO TABS
80.0000 mg | ORAL_TABLET | Freq: Every day | ORAL | Status: DC
  Administered 2024-07-07 – 2024-07-08 (×2): 80 mg via ORAL
  Filled 2024-07-07 (×2): qty 4

## 2024-07-07 MED ORDER — CAPSAICIN 0.025 % EX CREA: TOPICAL_CREAM | Freq: Three times a day (TID) | CUTANEOUS | Status: DC | PRN

## 2024-07-07 NOTE — Progress Notes (Signed)
" °  Progress Note   Patient: Danny Rhodes FMW:[id number] DOB: 1926/12/21 DOA: 07/06/2024     0 DOS: the patient was seen and examined on 07/07/2024   Brief hospital course: Danny Rhodes was admitted to the hospital with the working diagnosis of renal failure .  89 yo male with the past medical history of CKD stage V, hypertension, hyperlipidemia, gout, who presented with worsening renal failure.  She had labs on -1/20 with her primary care provider. Resulted in worsening serum cr, from 4.94 to 6.54, she was advised to come to the hospital.  On her initial physical examination her blood pressure was 160/47, HR 78, RR 18 and 02 saturation 100% on room air Lungs with no wheezing or rhonchi, heart with S1 and S2 present and regular, abdomen with no distention and no lower extremity edema.   Na 137, K 4.2 Cl 94 bicarbonate 25 glucose 100 bun 90 cr 8,46 Wbc 7,1 hgb 9,2 plt 281  Urine analysis SG 1,008, protein >300, negative nitrates, trace leukocytes and negative hgb   CT abdomen and pelvis with complex right renal cyst containing multiple thin calcified internal septations, which is been previously evaluated by ultrasound and CT exams. Numerous other simple and hyperdense bilateral renal cysts are identified as well.  Stable large right inguinal and scrotal hernia containing multiple loops of large and small bowel. No incarceration or obstruction  Prostatomegaly with evidence of chronic bladder outlet obstruction.   Assessment and Plan: * Acute renal failure superimposed on stage 5 chronic kidney disease, not on chronic dialysis (HCC) Progressive renal disease.  Patient does not want renal replacement therapy, discussed with palliative care team Decision to transition to home hospice  Will add oral torsemide  to avoid volume overload.   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         Subjective: Patient with no chest pain and no dyspnea, positive lower extremity edema   Physical Exam: Vitals:   07/06/24 2242 07/06/24 2328 07/07/24 0242 07/07/24 1057  BP: 129/72 139/87 (!) 149/57 (!) 145/69  Pulse: 88 85 70 90  Resp: 18     Temp: 97.7 F (36.5 C) 97.6 F (36.4 C) 98.5 F (36.9 C) 98.3 F (36.8 C)  TempSrc: Oral Oral Oral Oral  SpO2: 100% 99% 98%   Weight: 66.6 kg     Height: 5' 6 (1.676 m)      Neurology awake and alert ENT with mild pallor Cardiovascular with S1 and S2 present, irregular with no gallops or rubs No JVD Respiratory with mild rales with no wheezing or rhonchi Abdomen with no distention Positive lower extremity edema ++   Data Reviewed:    Family Communication: no family at the bedside   Disposition: Status is: Observation The patient remains OBS appropriate and will d/c before 2 midnights.  Planned Discharge Destination: hospice at home     Author: Elidia Toribio Furnace, MD 07/07/2024 1:01 PM  For on call review [email or web address].  "

## 2024-07-07 NOTE — Progress Notes (Signed)
 Danny Rhodes [address] [facility] Liaison Note   Received request from [place], [facility], for hospice services at home after discharge. Attempted to call daughter Jamee [phone] to initiate education related to hospice philosophy, services, and team approach to care. No answer and no option to leave message. [facility] Liaison will follow up.    Thank you for the opportunity to participate in this patient's care, please don't hesitate to call for any hospice related questions or concerns.     Nat Babe, BSN, [facility] Liaison  [phone]

## 2024-07-07 NOTE — Assessment & Plan Note (Signed)
 Progressive renal disease.  Patient does not want renal replacement therapy, discussed with palliative care team Decision to transition to home hospice  Will add oral torsemide  to avoid volume overload.

## 2024-07-07 NOTE — Consult Note (Cosign Needed Addendum)
 "                              Consultation Note Date: 07/07/2024   Patient Name: Danny Rhodes  DOB: [birth year]  MRN: [id number]  Age / Sex: 89 y.o., male  PCP: Gerome Brunet, DO Referring Physician: Noralee Elidia Toribio DEWAINE  Reason for Consultation: Establishing goals of care  HPI/Patient Profile: 89 y.o. male  with past medical history significant of CKD stage V, ACD, gout, HLD, hypertension, retinopathy and glaucoma admitted on 07/06/2024 with abnormal lab results from his nephrologist.  Nephrologist advised him to come to the ED.  He feels fine, specifically denies any increased lower extremity swelling, abdominal swelling, change in urination, confusion, and nausea vomiting.  Does have some skin itching.  Note that patient has had 1 inpatient admission in the last 6 months.  PMT has been consulted to assist with goals of care conversation.  Patient declines hemodialysis in the setting of chronic kidney disease stage V.  Patient faces treatment option decisions, advanced directive decisions and anticipatory care needs.    Clinical Assessment and Goals of Care:  I have reviewed medical records including: EPIC notes: Reviewed H&P note from Dr. Waddell detailing treatment plan mainly for acute renal failure superimposed on stage V chronic kidney disease. Note that renal function has significantly progressed over the last 6 months.  He is declining dialysis.  Reviewed to track clinical course and prognostication.  MAR: Reviewed PRN meds received over the last 24 hours.  None received reviewed to assess needs for medication adjustment to optimize comfort.  Available advanced directives in ACP: None Labs: Reviewed most recent lab results from 07/07/2024: Creatinine significantly elevated at 8.84 with an eGFR of 5.  Hemoglobin low but stable at 7.9 with hematocrit of 24.6.  Reviewed to assist with prescribing and prognostication  Met with patient discuss diagnosis prognosis, GOC, EOL wishes,  disposition and options.  I introduced Palliative Medicine as specialized medical care for people living with serious illness. It focuses on providing relief from the symptoms and stress of a serious illness. The goal is to improve quality of life for both the patient and the family.  Created space and opportunity for family to explore thoughts and feelings regarding patient's current medical condition.   Visited patient at bedside.  No family members were present.  He was resting comfortably in bed and was not in any acute distress.  He denied pain or discomfort, although reports occasional pruritus/itching.  He was alert and oriented to person place time and situation, and able to make his needs known.  Able to engage in meaningful conversation.  During the encounter, the patient was engaged to review use understanding of the reason for the current hospital admission. The patient a was able to articulate the primary reason for hospitalization.  He stated that his nephrologist sent him to the hospital due to worsening renal function and shared that his kidney disease has been progressively declining over the past 6 months.  He reported that hemodialysis had been discussed with him, however, he clearly stated that he does not wish to pursue dialysis.  I explored his reasons for declining dialysis.  He shared that his brother and several friends had undergone dialysis and, he witnessed them lived miserable lives with declining overall health.  He described witnessing them having to travel daily to dialysis center and experiencing a poor quality of life, which deeply affected him.  He expressed that he does not want to endure a similar experience.  I discussed the risks and benefits of dialysis as well as potential consequences of declining dialysis, including disease progression and eventual death.  The patient demonstrated clear understanding of his condition, and expected clinical trajectory without  dialysis, and the implications of his decision.  Despite this, he remained firm in his decision.  I provided education regarding the nature and progression of the patients underlying disease in light of not pursuing any sort of renal replacement therapy. We discussed that the patients condition is advanced, chronic kidney disease stage V with a poor prognosis. I explained that the risk for further decompensation is high, and the health journey ahead is expected to be challenging, with likely setbacks, re hospitalizations.   We discussed the importance of establishing clear goals of care, ensuring that all interventions and treatments align with the patients beliefs, values, and preferences. The patient was encouraged to share their wishes and priorities, and we reviewed how our care plan can best support these goals, whether that involves pursuing aggressive interventions or focusing on comfort measures.    We discussed treatment options with an emphasis on hospice philosophy of care given dialysis refusal.  The patient verbalized that he does not wish to pursue life-prolonging treatments that may cause pain or suffering, including dialysis and instead prefers a comfort focused approach.  We reviewed that comfort focused care aligns with hospice level services outside the hospital setting.  He agreed with this approach.   I provided education on the philosophy of hospice and palliative care, highlighting the focus on symptom management, comfort, and support for both patient and family. We discussed the different settings in which these services can be provided, including home hospice and residential hospice facilities, and the types of support available in each.  The family was informed that hospice is a federal Medicare benefit, and I reviewed what hospice services entail, including interdisciplinary support (nursing, social work, building services engineer), medication management, equipment provision, and respite  care. I clarified eligibility criteria and the process for enrollment.  I discussed and provided education about CKD stage V without dialysis, that trajectory is typically a steady functional decline with increasing fatigue, anorexia, and cognitive changes, followed by steep decline in the final weeks.  I shared that survival beyond 6 months is uncommon, and care should focus on comfort, symptom control, and advance care planning.  The current medical plan was reviewed, with a focus on treating the treatable and medical optimization.  I summarized that based on our conversation today, patient opted that we will continue to address treatable issues as appropriate, with the exception of hemodialysis, while also allowing time to assess outcomes and adjust the plan as needed, hoping for clinical improvement.  Discharge planning include home with hospice through [facility].  Discussed the importance of continued conversation with family and the medical providers regarding overall plan of care and treatment options, ensuring decisions are within the context of the patients values and GOCs.   Questions and concerns were addressed.  Hard Choices booklet left for review. The family was encouraged to call with questions or concerns.  PMT will continue to support holistically.  Following the discussion, I contacted his daughter, Danny Rhodes, to provide an update.  I reviewed the conversation held with the patient and his expressed wishes.  Danny Rhodes confirmed that her father is alert and oriented and is able to make sound decision.  Danny Rhodes stated that the family fully supports and respects  his decisions.  She shared that he has previously discussed his wishes with her and his other children regarding not pursuing dialysis.  Danny Rhodes ask thoughtful and appropriate questions regarding the hospice philosophy of care, which were addressed.  Coordinated plan of care with primary RN.  Primary RN reports no acute events over the  last 24 hours.  Discussed about the plan for optimizing patient medically while hospitalized, then plan for discharge to home with hospice.  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  He shared that he enjoys meeting and caring for people.  He is married and has 5 children.  He currently resides with one of his children, Danny Rhodes, who is his designated healthcare power of attorney.  He stated that all of his siblings are deceased.  Functional and Nutritional State: Patient is currently with functional limitation due to acute and chronic illnesses.   Palliative Symptoms: Increasing weakness, pruritus  Advance Directives: A detailed discussion regarding advanced directives was held with the patient and family today, highlighting the benefits of having clear documentation to guide care in accordance with the patients wishes. The patient has previously completed advanced directive documents. I advised the patient and her daughter to provide a copy for inclusion in our medical records to ensure their preferences are honored.   Code Status: DNR limited Concepts specific to code status, artificial feeding and hydration, continued IV antibiotics and rehospitalization was held. The difference between aggressive medical intervention and a palliative comfort path for this patient at this time was had.   Primary Decision Maker: Patient    SUMMARY OF RECOMMENDATIONS   # Complex medical decision making/goals of care Code Status: Maintain DNR limited Patient expressed strong conviction to no hemodialysis.   No escalation in level of care patient should patient deteriorates during this hospital stay. Continue to treat the treatable, hoping to medically optimized When medically optimized, plan is to discharge with home with hospice through [facility], [facility] consult placed to assist. Palliative medicine team will continue to follow  for support  # Symptom management Per Primary team Add Capsaicin  PRN for itching. Consider adding gabapentin if not effective Palliative medicine is available to assist as needed.    # Psychosocial support Continue to provide psycho-social and emotional support to patient and family   # Treatment plan discussed with:  Patient, patient's daughter Danny Rhodes, nursing, and Medical team.    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  HENT:     Head: Normocephalic and atraumatic.     Mouth/Throat:     Mouth: Mucous membranes are moist.  Cardiovascular:     Rate and Rhythm: Normal rate.  Pulmonary:     Effort: Pulmonary effort is normal.  Abdominal:     General: Abdomen is flat. Bowel sounds are normal.     Palpations: Abdomen  is soft.  Skin:    General: Skin is warm and dry.     Comments: Some itching noted.   Neurological:     Mental Status: He is alert. Mental status is at baseline.  Psychiatric:        Mood and Affect: Mood normal.     Vital Signs: BP (!) 149/57 (BP Location: Left Arm)   Pulse 70   Temp 98.5 F (36.9 C) (Oral)   Resp 18   Ht 5' 6 (1.676 m)   Wt 66.6 kg   SpO2 98%   BMI 23.70 kg/m  Pain Scale: 0-10   Pain Score: 0-No pain   SpO2: SpO2: 98 % O2 Device:SpO2: 98 % O2 Flow Rate: .    Palliative  Assessment/Data: 50 %   I personally spent a total of 90 minutes in the care of the patient today including preparing to see the patient, getting/reviewing separately obtained history, performing a medically appropriate exam/evaluation, counseling and educating, placing orders, referring and communicating with other health care professionals, documenting clinical information in the EHR, and coordinating care.   Thank you for allowing us  to participate in the care of Danny Rhodes  Andreya Lacks F Airen Stiehl, Jr, NP  Palliative Medicine Team Team phone # [phone]  Thank you for allowing the Palliative Medicine Team to assist in the care of this patient. Please utilize secure chat with additional questions, if there is no response within 30 minutes please call the above phone number.  Palliative Medicine Team providers are available by phone from 7am to 7pm daily and can be reached through the team cell phone.  Should this patient require assistance outside of these hours, please call the patient's attending physician.   "

## 2024-07-07 NOTE — Plan of Care (Signed)

## 2024-07-07 NOTE — Hospital Course (Signed)
 Mr. Parfait was admitted to the hospital with the working diagnosis of renal failure .  89 yo male with the past medical history of CKD stage V, hypertension, hyperlipidemia, gout, who presented with worsening renal failure.  She had labs on -1/20 with her primary care provider. Resulted in worsening serum cr, from 4.94 to 6.54, she was advised to come to the hospital.  On her initial physical examination her blood pressure was 160/47, HR 78, RR 18 and 02 saturation 100% on room air Lungs with no wheezing or rhonchi, heart with S1 and S2 present and regular, abdomen with no distention and no lower extremity edema.   Na 137, K 4.2 Cl 94 bicarbonate 25 glucose 100 bun 90 cr 8,46 Wbc 7,1 hgb 9,2 plt 281  Urine analysis SG 1,008, protein >300, negative nitrates, trace leukocytes and negative hgb   CT abdomen and pelvis with complex right renal cyst containing multiple thin calcified internal septations, which is been previously evaluated by ultrasound and CT exams. Numerous other simple and hyperdense bilateral renal cysts are identified as well.  Stable large right inguinal and scrotal hernia containing multiple loops of large and small bowel. No incarceration or obstruction  Prostatomegaly with evidence of chronic bladder outlet obstruction.

## 2024-07-07 NOTE — TOC Initial Note (Signed)
 Transition of Care [facility]) - Initial/Assessment Note    Patient Details  Name: Danny Rhodes MRN: [id number] Date of Birth: 03/05/1927  Transition of Care [facility]) CM/SW Contact:    Sonda Manuella Quill, RN Phone Number: 07/07/2024, 12:27 PM  Clinical Narrative:                 Beatris w/ pt over room phone; pt said he lives at home w/ family Itzael Liptak (spouse) [phone] / Holli Hopping (dtr) [phone]; he plans to return at d/c w/ their support; his dtr will provide transportation; insurance/PCP verified; he denied SDOH risks; pt has cane, walker, and wheelchair; he does not have HH services or home oxygen; IP CM will follow.    Barriers to Discharge: Continued Medical Work up   Patient Goals and CMS Choice Patient states their goals for this hospitalization and ongoing recovery are:: home     [place] ownership interest in [facility].provided to:: Patient    Expected Discharge Plan and Services   Discharge Planning Services: CM Consult                     DME Arranged: N/A DME Agency: NA       HH Arranged: NA HH Agency: NA        Prior Living Arrangements/Services   Lives with:: Spouse, Adult Children Patient language and need for interpreter reviewed:: Yes Do you feel safe going back to the place where you live?: Yes      Need for Family Participation in Patient Care: Yes (Comment) Care giver support system in place?: Yes (comment) Current home services: DME (cane, walker, wheelchair) Criminal Activity/Legal Involvement Pertinent to Current Situation/Hospitalization: No - Comment as needed  Activities of Daily Living   ADL Screening (condition at time of admission) Independently performs ADLs?: Yes (appropriate for developmental age) Is the patient deaf or have difficulty hearing?: No Does the patient have difficulty seeing, even when wearing glasses/contacts?: No Does the patient have difficulty concentrating, remembering, or making  decisions?: No  Permission Sought/Granted Permission sought to share information with : Case Manager Permission granted to share information with : Yes, Verbal Permission Granted  Share Information with NAME: Case Manager     Permission granted to share info w Relationship: Kanan Sobek (spouse) [phone] / Holli Hopping (dtr) [phone]     Emotional Assessment Appearance:: Other (Comment Required (unable to assess) Attitude/Demeanor/Rapport: Gracious Affect (typically observed): Accepting Orientation: : Oriented to Self, Oriented to Place, Oriented to  Time, Oriented to Situation Alcohol / Substance Use: Not Applicable Psych Involvement: No (comment)  Admission diagnosis:  AKI (acute kidney injury) [N17.9] Acute renal failure superimposed on stage 5 chronic kidney disease, not on chronic dialysis (HCC) [N17.9, N18.5] Stage 5 chronic kidney disease not on chronic dialysis [facility]) [N18.5] Patient Active Problem List   Diagnosis Date Noted   Paroxysmal atrial fibrillation (HCC) 07/06/2024   Acute renal failure superimposed on stage 5 chronic kidney disease, not on chronic dialysis (HCC) 07/06/2024   Glaucoma 07/06/2024   Chronic kidney disease, stage V (HCC) 06/29/2024   Anemia due to stage 4 chronic kidney disease treated with erythropoietin  (HCC) 02/16/2024   New onset a-fib (HCC) 01/04/2024   Hyperlipidemia 12/31/2023   Anemia of chronic renal failure 11/14/2023   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, right 12/01/2021   Protein-calorie malnutrition, severe 11/27/2021   Failure to thrive in adult    Acute renal failure superimposed on stage  4 chronic kidney disease (HCC) 11/25/2021   Dysphagia 11/25/2021   Essential hypertension 11/25/2021   Gout 11/25/2021   CKD (chronic kidney disease), symptom management only, stage 4 (severe) (HCC) 04/23/2021   History of anemia due to chronic kidney disease 04/23/2021   PCP:  Gerome Brunet,  DO Pharmacy:   [facility] [phone] - [place] - [address] AT [facility] [address] [facility] [address]  [place] [address] Phone: [phone] Fax: [phone]     Social Drivers of Health (SDOH) Social History: SDOH Screenings   Food Insecurity: No Food Insecurity (07/07/2024)  Housing: Unknown (07/07/2024)  Transportation Needs: No Transportation Needs (07/07/2024)  Utilities: Not At Risk (07/07/2024)  Depression (PHQ2-9): Low Risk (01/09/2024)  Social Connections: Moderately Integrated (07/06/2024)  Tobacco Use: Medium Risk (07/06/2024)   SDOH Interventions: Food Insecurity Interventions: Intervention Not Indicated, Inpatient TOC Housing Interventions: Intervention Not Indicated, Inpatient TOC Transportation Interventions: Intervention Not Indicated, Inpatient TOC Utilities Interventions: Intervention Not Indicated, Inpatient TOC   Readmission Risk Interventions     No data to display

## 2024-07-07 NOTE — TOC Progression Note (Addendum)
 Transition of Care [facility]) - Progression Note    Patient Details  Name: Danny Rhodes MRN: [id number] Date of Birth: 12-06-1926  Transition of Care [facility]) CM/SW Contact  Sonda Manuella Quill, RN Phone Number: 07/07/2024, 3:09 PM  Clinical Narrative:    Referral received for home hospice w/ [facility]; POC dtr [facility] correction [phone]; Nat Babe, [facility] Liaison notified via secure chat.     Barriers to Discharge: Continued Medical Work up               Expected Discharge Plan and Services   Discharge Planning Services: CM Consult                     DME Arranged: N/A DME Agency: NA       HH Arranged: NA HH Agency: NA         Social Drivers of Health (SDOH) Interventions SDOH Screenings   Food Insecurity: No Food Insecurity (07/07/2024)  Housing: Unknown (07/07/2024)  Transportation Needs: No Transportation Needs (07/07/2024)  Utilities: Not At Risk (07/07/2024)  Depression (PHQ2-9): Low Risk (01/09/2024)  Social Connections: Moderately Integrated (07/06/2024)  Tobacco Use: Medium Risk (07/06/2024)    Readmission Risk Interventions     No data to display

## 2024-07-07 NOTE — Care Management Obs Status (Signed)
 MEDICARE OBSERVATION STATUS NOTIFICATION   Patient Details  Name: Danny Rhodes MRN: [id number] Date of Birth: [birth year]   Medicare Observation Status Notification Given:  Yes    Sonda Manuella Quill, RN 07/07/2024, 12:16 PM

## 2024-07-08 NOTE — TOC Transition Note (Signed)
 Transition of Care [facility]) - Discharge Note   Patient Details  Name: Danny Rhodes MRN: [id number] Date of Birth: 1926/06/17  Transition of Care [facility]) CM/SW Contact:  Sonda Manuella Quill, RN Phone Number: 07/08/2024, 11:49 AM   Clinical Narrative:    D/C orders received; spoke w/ dtr Jamee Scales [phone]); she said family cannot provide transportation and agreed to transport by Gisele Albino Jes, Supervisor notified and will arrange transportation; no IP CM needs,   Final next level of care: Home w Hospice Care Barriers to Discharge: No Barriers Identified   Patient Goals and CMS Choice Patient states their goals for this hospitalization and ongoing recovery are:: home     [place] ownership interest in [facility].provided to:: Patient    Discharge Placement                       Discharge Plan and Services Additional resources added to the After Visit Summary for     Discharge Planning Services: CM Consult            DME Arranged: N/A DME Agency: NA       HH Arranged: NA HH Agency: NA        Social Drivers of Health (SDOH) Interventions SDOH Screenings   Food Insecurity: No Food Insecurity (07/07/2024)  Housing: Unknown (07/07/2024)  Transportation Needs: No Transportation Needs (07/07/2024)  Utilities: Not At Risk (07/07/2024)  Depression (PHQ2-9): Low Risk (01/09/2024)  Social Connections: Moderately Integrated (07/06/2024)  Tobacco Use: Medium Risk (07/06/2024)     Readmission Risk Interventions     No data to display

## 2024-07-08 NOTE — Progress Notes (Signed)
 Danny Rhodes [address] [facility] Liaison Note:  Received request from [place], Futures Trader, for hospice services at home after discharge. Spoke with daughter Jamee to initiate education related to hospice philosophy, services, and team approach to care. Patient/family verbalized understanding of information given. Per discussion, the plan is for discharge home by private vehicle when medically stable for discharge.   DME needs discussed. Patient has the following equipment in the home: Built in shower with seat, FWW, manual WC  Patient/family requests the following equipment for delivery: None at this time   The address has been verified and is correct in the chart.      Please send signed and completed DNR home with patient/family. Please provide prescriptions at discharge as needed to ensure ongoing symptom management.     [facility] information and contact numbers given to Yvonne (dtr). Above information shared with hospital team and ICM via Epic chat.   Please call with any questions or concerns.   Thank you for the opportunity to participate in this patients care.   Nat Babe, BSN, RN Hospice Nurse Liaison [phone]

## 2024-07-08 NOTE — Discharge Summary (Signed)
 " Physician Discharge Summary   Patient: Danny Rhodes MRN: [id number] DOB: Jan 23, 1927  Admit date:     07/06/2024  Discharge date: 07/08/24  Discharge Physician: Elidia Sieving [place] Nase   PCP: Gerome Brunet, DO   Recommendations at discharge:    Patient and family would like to continue care under hospice, no aggressive or invasive measures, no renal replacement therapy.   I spoke with patient's daughter over the phone, we talked in detail about patient's condition, plan of care and prognosis and all questions were addressed.   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 [facility] Course: Mr. Fare was admitted to the hospital with the working diagnosis of progressive renal failure .  89 yo male with the past medical history of CKD stage V, hypertension, hyperlipidemia, gout, who presented with worsening renal failure.  He had labs on -1/20 with her primary care provider. Resulted in worsening serum cr, from 4.94 to 6.54, he was advised to come to the hospital.  On his initial physical examination his blood pressure was 160/47, HR 78, RR 18 and 02 saturation 100% on room air Lungs with no wheezing or rhonchi, heart with S1 and S2 present and regular, abdomen with no distention and no lower extremity edema.   Na 137, K 4.2 Cl 94 bicarbonate 25 glucose 100 bun 90 cr 8,46 Wbc 7,1 hgb 9,2 plt 281  Urine analysis SG 1,008, protein >300, negative nitrates, trace leukocytes and negative hgb   CT abdomen and pelvis with complex right renal cyst containing multiple thin calcified internal septations,  which is been previously evaluated by ultrasound and CT exams. Numerous other simple and hyperdense bilateral renal cysts are identified as well.  Stable large right inguinal and scrotal hernia containing multiple loops of large and small bowel. No incarceration or obstruction  Prostatomegaly with evidence of chronic bladder outlet obstruction.   Renal ultrasound with complex echogenicity lesion/ mass measuring up to 10.2 cm, lateral to the right kidney, with additional component versus separate mass measuring 6.6 cm in the right lower quadrant.  Lesions appear to be associated with destructive process about the right hip.  Additional bilateral renal cysts measuring up to 5.5 cm on the right and 2.3 cm on the left, consistent with benign lesions.  Increased echogenicity within the renal parenchyma, compatible with medical renal disease.  No hydronephrosis.   Patient with poor prognosis, palliative care was consulted.  Patient and family decided to continue care under hospice, no hemodialysis or aggressive measures.   Assessment and Plan: * Acute renal failure superimposed on stage 5 chronic kidney disease, not on chronic dialysis (HCC) Progressive renal disease.  Patient does not want renal replacement therapy, discussed with palliative care team Decision to transition to home hospice  Will add oral torsemide  to avoid volume overload.   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          Consultants: palliative care  Procedures performed: none   Disposition: Home Diet recommendation:  Renal diet DISCHARGE MEDICATION: Allergies as of 07/08/2024   No Known Allergies      Medication List     TAKE these medications    acetaminophen  500 MG tablet Commonly known as: TYLENOL  Take 500-1,000 mg by mouth 2 (two) times daily as needed for mild pain (pain score 1-3) (or headaches). What changed: when to take this   allopurinol  100 MG tablet Commonly known as: ZYLOPRIM  Take 100 mg by mouth every morning.   amLODipine  10 MG tablet Commonly known as: NORVASC  Take 1 tablet (10 mg total) by mouth in the morning.   apixaban  2.5 MG Tabs tablet Commonly known as: ELIQUIS  Take 1 tablet (2.5 mg total) by mouth 2 (two) times daily.   ascorbic acid 500 MG tablet Commonly known as: VITAMIN C Take 500 mg by mouth daily.   atorvastatin  20 MG tablet Commonly known as: LIPITOR Take 20 mg by mouth every morning.   Beet Root 500 MG Caps Take 2,000 mg by mouth daily after breakfast.   carvedilol  6.25 MG tablet Commonly known as: COREG  Take 6.25 mg by mouth 2 (two) times daily with a meal.   dorzolamide  2 % ophthalmic solution Commonly known as: TRUSOPT  Place 1 drop into both eyes 2 (two) times daily.   feeding supplement Liqd Take 237 mLs by mouth 2 (two) times daily between meals. What changed: when to take this   High Potency Iron  65 MG Tabs Take 130 mg by mouth daily after breakfast.   latanoprost  0.005 % ophthalmic solution Commonly known as: XALATAN  Place 1 drop into both eyes at bedtime.   mirtazapine  15 MG tablet Commonly known as: REMERON  Take 15 mg by mouth at bedtime.   pantoprazole  40 MG tablet Commonly known as: PROTONIX  Take 1 tablet (40 mg total) by mouth daily at 6 (six) AM.   torsemide  100 MG tablet Commonly known as: DEMADEX  Take 1 tablet (100 mg total) by mouth daily.         Follow-up Information     [facility] Follow up.   Contact information: [address] [facility] [place]  [address]               Discharge Exam: Fredricka Weights   07/06/24 1351 07/06/24 2242  Weight: 72.6 kg 66.6 kg   BP (!) 142/46 (BP Location: Left Arm)   Pulse 70   Temp 99 F (37.2 C)   Resp 16   Ht 5' 6 (1.676 m)   Wt 66.6 kg   SpO2 99%   BMI 23.70 kg/m   Patient with no chest pain and no dyspnea, no nausea or vomiting  Neurology awake and alert ENT with mild pallor Cardiovascular with S1 and S2 present and regular with no gallops or rubs Respiratory with no rales or wheezing Abdomen with no distention Lower extremity edema is mild +   Condition at discharge: stable  The results of significant diagnostics from this hospitalization (including imaging, microbiology, ancillary and laboratory) are listed below for reference.   Imaging Studies: US  Renal Result Date: 07/06/2024 CLINICAL DATA:  Initial evaluation for exophytic lesion on prior ultrasound. EXAM:  RENAL / URINARY TRACT ULTRASOUND COMPLETE COMPARISON:  Comparison made with dedicated CT of the abdomen and pelvis performed on the same day as well as previous ultrasound from 02/02/2017. FINDINGS: Right Kidney: Renal measurements: 10.0 x 6.0 x 7.4 cm = volume: 228.2 mL. Increased echogenicity within the renal parenchyma. No visible nephrolithiasis or hydronephrosis. 5.0 x 5.5 x 5.2 cm complex cystic lesion present within the interpolar right kidney. There is an additional complex mixed echogenicity lesion/mass measuring approximately 10.2 x 7.6 x 7.2 cm lateral to the right kidney. In additional heterogeneous hypoechoic component seen inferiorly within the right lower quadrant measuring 6.6 x 5.7 x 6.2 cm. Left Kidney: Renal measurements: 9.6 x 4.5 x 5.3 cm = volume: 119.2 mL. Increased echogenicity within the renal parenchyma. No nephrolithiasis or hydronephrosis. 2.2 x 2.2 x 2.3 cm hypoechoic cyst  at the lower pole. Bladder: Appears normal for degree of bladder distention. Other: None. IMPRESSION: 1. Complex mixed echogenicity lesion/mass measuring up to 10.2 cm lateral to the right kidney, with additional component versus separate mass measuring up to 6.6 cm in the right lower quadrant. Upon comparison a prior CT performed the same day. These lesions appear to be associated with a destructive process about the right hip. Please refer to that exam for further evaluation of these findings. 2. Additional bilateral renal cysts measuring up to 5.5 cm on the right and 2.3 cm on the left, similar as compared to previous ultrasound from 02/02/2017, consistent with benign lesions. 3. Increased echogenicity within the renal parenchyma, compatible with medical renal disease. 4. No hydronephrosis. Electronically Signed   By: Morene Hoard M.D.   On: 07/06/2024 19:39   CT ABDOMEN PELVIS WO CONTRAST Result Date: 07/06/2024 CLINICAL DATA:  Right renal mass on ultrasound EXAM: CT ABDOMEN AND PELVIS WITHOUT CONTRAST TECHNIQUE: Multidetector CT imaging of the abdomen and pelvis was performed following the standard protocol without IV contrast. Unenhanced CT was performed per clinician order. Lack of IV contrast limits sensitivity and specificity, especially for evaluation of abdominal/pelvic solid viscera. RADIATION DOSE REDUCTION: This exam was performed according to the departmental dose-optimization program which includes automated exposure control, adjustment of the mA and/or kV according to patient size and/or use of iterative reconstruction technique. COMPARISON:  08/20/2008, 07/06/2024, 11/27/2021 FINDINGS: Lower chest: No acute pleural or parenchymal lung disease. Hepatobiliary: Scattered hepatic cysts. Calcified gallstones without evidence of acute cholecystitis. Pancreas: Unremarkable unenhanced appearance. Spleen: Unremarkable unenhanced appearance. Adrenals/Urinary Tract: There is a complex cyst within  the mid to upper right kidney, measuring 6.3 x 5.2 x 5.5 cm reference image 28/2, with multiple internal calcified septations identified. 1.7 cm hyperdense cyst is seen within the right kidney reference image 24/2. There is a lobular peripelvic cyst lower pole left kidney measuring 1.9 x 3.1 x 2.1 cm, reference image 32/2. Subcentimeter simple and hyperdense cysts are seen within the upper pole left kidney, largest measuring 0.8 cm. No evidence of urinary tract calculi or obstructive uropathy within either kidney. The adrenals are grossly normal. The bladder is decompressed, with trabeculated thickened bladder wall most consistent with chronic bladder outlet obstruction. Stomach/Bowel: No bowel obstruction or ileus. Large and small bowel are contained within a large right inguinal hernia extending into the right hemiscrotum, similar to prior exams. No evidence of incarceration or obstruction. Vascular/Lymphatic: Aortic atherosclerosis. No enlarged abdominal or pelvic lymph nodes. Reproductive: Moderate prostatomegaly. Other: There is no free fluid or free intraperitoneal gas. A large right inguinal hernia extending into the right hemiscrotum is again noted,  containing multiple loops of large and small bowel. No incarceration or obstruction. Musculoskeletal: Destructive changes are again seen centered at the right hip, related to previous right hip arthroplasty and sequela of particle disease. Multiple ovoid masses extending superiorly from the right hip joint space are compatible with sequela of particle disease, similar to prior CT exam. The most superior of these which measures 9.6 x 8.1 x 6.2 cm likely corresponds to the areas adjacent to the right kidney identified on recent ultrasound. There are no acute displaced fractures. Reconstructed images demonstrate no additional findings. IMPRESSION: 1. Complex right renal cyst containing multiple thin calcified internal septations, which is been previously evaluated  by ultrasound and CT exams. Numerous other simple and hyperdense bilateral renal cysts are identified as well. In light of patient's age and comorbidities, no specific imaging follow-up is recommended. 2. 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 The soft tissue masses extending superiorly from the joint space likely reflect the ultrasound findings lateral to the right kidney. No follow-up is recommended. 3. Stable large right inguinal and scrotal hernia containing multiple loops of large and small bowel. No incarceration or obstruction. 4. Prostatomegaly with evidence of chronic bladder outlet obstruction. Electronically Signed   By: Ozell Daring M.D.   On: 07/06/2024 19:29    Microbiology: Results for orders placed or performed during the hospital encounter of 12/31/23  MRSA Next Gen by PCR, Nasal     Status: None   Collection Time: 12/31/23  3:03 PM   Specimen: Nasal Mucosa; Nasal Swab  Result Value Ref Range Status   MRSA by PCR Next Gen NOT DETECTED NOT DETECTED Final    Comment: (NOTE) The GeneXpert MRSA Assay (FDA approved for NASAL specimens only), is one component of a comprehensive MRSA colonization surveillance program. It is not intended to diagnose MRSA infection nor to guide or monitor treatment for MRSA infections. Test performance is not FDA approved in patients less than 67 years old. Performed at [facility], [address], [place], [place] [address]     Labs: CBC: Recent Labs  Lab 07/06/24 1440 07/07/24 0712  WBC 7.1 5.8  NEUTROABS 5.2  --   HGB 9.2* 7.9*  HCT 29.2* 24.6*  MCV 87.2 86.0  PLT 281 245   Basic Metabolic Panel: Recent Labs  Lab 07/06/24 1440 07/06/24 2257 07/07/24 0712  NA 137  --  137  K 4.2  --  4.3  CL 94*  --  96*  CO2 25  --  23  GLUCOSE 100*  --  93  BUN 90*  --  92*  CREATININE 8.46*  --  8.84*  CALCIUM  10.5*  --  9.8  MG  --  3.2*  --   PHOS  --  8.7*   --    Liver Function Tests: Recent Labs  Lab 07/06/24 1440  AST 19  ALT 12  ALKPHOS 96  BILITOT 0.5  PROT 8.3*  ALBUMIN 4.4   CBG: No results for input(s): GLUCAP in the last 168 hours.  Discharge time spent: greater than 30 minutes.  Signed: Elidia Toribio Furnace, MD [facility] 07/08/2024 "

## 2024-07-08 NOTE — Plan of Care (Signed)

## 2024-07-10 ENCOUNTER — Inpatient Hospital Stay (HOSPITAL_COMMUNITY): Admission: RE | Admit: 2024-07-10 | Source: Ambulatory Visit

## 2024-07-24 ENCOUNTER — Encounter (HOSPITAL_COMMUNITY)

## 2024-12-19 ENCOUNTER — Encounter (INDEPENDENT_AMBULATORY_CARE_PROVIDER_SITE_OTHER): Admitting: Ophthalmology
